# Patient Record
Sex: Male | Born: 1950
Health system: Southern US, Community
[De-identification: ages and names within clinical notes are randomized; demographics above are authoritative.]

## PROBLEM LIST (undated history)

## (undated) DIAGNOSIS — R972 Elevated prostate specific antigen [PSA]: Secondary | ICD-10-CM

## (undated) DIAGNOSIS — T883XXA Malignant hyperthermia due to anesthesia, initial encounter: Secondary | ICD-10-CM

## (undated) DIAGNOSIS — E669 Obesity, unspecified: Secondary | ICD-10-CM

## (undated) DIAGNOSIS — N4231 Prostatic intraepithelial neoplasia: Secondary | ICD-10-CM

## (undated) DIAGNOSIS — I10 Essential (primary) hypertension: Secondary | ICD-10-CM

## (undated) DIAGNOSIS — Z9989 Dependence on other enabling machines and devices: Secondary | ICD-10-CM

## (undated) DIAGNOSIS — N529 Male erectile dysfunction, unspecified: Secondary | ICD-10-CM

## (undated) DIAGNOSIS — E7800 Pure hypercholesterolemia, unspecified: Secondary | ICD-10-CM

## (undated) DIAGNOSIS — M1990 Unspecified osteoarthritis, unspecified site: Secondary | ICD-10-CM

## (undated) DIAGNOSIS — G4733 Obstructive sleep apnea (adult) (pediatric): Secondary | ICD-10-CM

## (undated) HISTORY — PX: JOINT REPLACEMENT: SHX530

## (undated) HISTORY — DX: Male erectile dysfunction, unspecified: N52.9

## (undated) HISTORY — DX: Prostatic intraepithelial neoplasia: N42.31

## (undated) HISTORY — PX: HAMMER TOE SURGERY: SHX385

## (undated) HISTORY — DX: Essential (primary) hypertension: I10

## (undated) HISTORY — DX: Pure hypercholesterolemia, unspecified: E78.00

## (undated) HISTORY — DX: Obesity, unspecified: E66.9

## (undated) HISTORY — PX: SOFT TISSUE BIOPSY: SHX1053

## (undated) HISTORY — DX: Elevated prostate specific antigen (PSA): R97.20

---

## 1984-09-10 DIAGNOSIS — T883XXA Malignant hyperthermia due to anesthesia, initial encounter: Secondary | ICD-10-CM

## 1984-09-10 HISTORY — DX: Malignant hyperthermia due to anesthesia, initial encounter: T88.3XXA

## 2003-09-22 ENCOUNTER — Emergency Department (HOSPITAL_COMMUNITY): Admission: AD | Admit: 2003-09-22 | Discharge: 2003-09-22 | Payer: Self-pay | Admitting: Family Medicine

## 2006-04-23 ENCOUNTER — Ambulatory Visit: Payer: Self-pay | Admitting: Gastroenterology

## 2007-08-13 DIAGNOSIS — I10 Essential (primary) hypertension: Secondary | ICD-10-CM | POA: Insufficient documentation

## 2007-10-08 DIAGNOSIS — E7800 Pure hypercholesterolemia, unspecified: Secondary | ICD-10-CM

## 2007-10-08 DIAGNOSIS — E785 Hyperlipidemia, unspecified: Secondary | ICD-10-CM | POA: Insufficient documentation

## 2007-10-08 HISTORY — DX: Pure hypercholesterolemia, unspecified: E78.00

## 2008-01-06 DIAGNOSIS — R6882 Decreased libido: Secondary | ICD-10-CM | POA: Insufficient documentation

## 2008-07-05 DIAGNOSIS — M2140 Flat foot [pes planus] (acquired), unspecified foot: Secondary | ICD-10-CM | POA: Insufficient documentation

## 2010-02-01 DIAGNOSIS — L83 Acanthosis nigricans: Secondary | ICD-10-CM | POA: Insufficient documentation

## 2012-03-25 ENCOUNTER — Ambulatory Visit: Payer: Self-pay | Admitting: Physician Assistant

## 2015-02-22 ENCOUNTER — Other Ambulatory Visit: Payer: Self-pay

## 2015-02-24 ENCOUNTER — Ambulatory Visit (INDEPENDENT_AMBULATORY_CARE_PROVIDER_SITE_OTHER): Payer: BLUE CROSS/BLUE SHIELD | Admitting: Family Medicine

## 2015-02-24 ENCOUNTER — Encounter: Payer: Self-pay | Admitting: Family Medicine

## 2015-02-24 VITALS — BP 122/84 | HR 71 | Temp 97.9°F | Resp 18 | Ht 72.0 in | Wt 276.0 lb

## 2015-02-24 DIAGNOSIS — E669 Obesity, unspecified: Secondary | ICD-10-CM | POA: Insufficient documentation

## 2015-02-24 DIAGNOSIS — G4733 Obstructive sleep apnea (adult) (pediatric): Secondary | ICD-10-CM | POA: Diagnosis not present

## 2015-02-24 DIAGNOSIS — M9420 Chondromalacia, unspecified site: Secondary | ICD-10-CM | POA: Insufficient documentation

## 2015-02-24 DIAGNOSIS — E7800 Pure hypercholesterolemia, unspecified: Secondary | ICD-10-CM

## 2015-02-24 DIAGNOSIS — E66812 Obesity, class 2: Secondary | ICD-10-CM | POA: Insufficient documentation

## 2015-02-24 DIAGNOSIS — Z1159 Encounter for screening for other viral diseases: Secondary | ICD-10-CM | POA: Insufficient documentation

## 2015-02-24 DIAGNOSIS — J301 Allergic rhinitis due to pollen: Secondary | ICD-10-CM | POA: Diagnosis not present

## 2015-02-24 DIAGNOSIS — I10 Essential (primary) hypertension: Secondary | ICD-10-CM

## 2015-02-24 DIAGNOSIS — E668 Other obesity: Secondary | ICD-10-CM | POA: Diagnosis not present

## 2015-02-24 DIAGNOSIS — N528 Other male erectile dysfunction: Secondary | ICD-10-CM

## 2015-02-24 DIAGNOSIS — Z9989 Dependence on other enabling machines and devices: Secondary | ICD-10-CM

## 2015-02-24 DIAGNOSIS — IMO0002 Reserved for concepts with insufficient information to code with codable children: Secondary | ICD-10-CM

## 2015-02-24 HISTORY — DX: Dependence on other enabling machines and devices: Z99.89

## 2015-02-24 HISTORY — DX: Obesity, unspecified: E66.9

## 2015-02-24 HISTORY — DX: Obstructive sleep apnea (adult) (pediatric): G47.33

## 2015-02-24 MED ORDER — TADALAFIL 20 MG PO TABS: 20.0000 mg | ORAL_TABLET | Freq: Every day | ORAL | Status: DC | PRN

## 2015-02-24 NOTE — Patient Instructions (Signed)
F/u in 4 mo 

## 2015-02-24 NOTE — Progress Notes (Signed)
Name: Warren [name] [place]   MRN: [id number]    DOB: [birth year]   Date:02/24/2015       Progress Note  Subjective  Chief Complaint  Chief Complaint  Patient presents with  . Hypertension    Hypertension This is a chronic problem. The current episode started more than 1 year ago. The problem has been gradually improving since onset. Pertinent negatives include no blurred vision, chest pain, headaches, neck pain, orthopnea, palpitations or shortness of breath. There are no associated agents to hypertension. Risk factors for coronary artery disease include obesity and stress. Past treatments include angiotensin blockers, diuretics and calcium channel blockers. There are no compliance problems.  There is no history of kidney disease, CAD/MI or CVA.  Erectile Dysfunction This is a chronic problem. The current episode started more than 1 year ago. The problem is unchanged. The nature of his difficulty is achieving erection, maintaining erection and penetration. Non-physiologic factors contributing to erectile dysfunction are a decreased libido. He reports no anxiety. Irritative symptoms do not include frequency or urgency. Pertinent negatives include no chills, genital pain or hematuria. The symptoms are aggravated by stress. The treatment provided moderate relief. He has been using treatment for 2 or more years. He has had no adverse reactions caused by medications. Risk factors include hypertension.   Obstructive sleep apnea.  Patient continues uses CPAP on a regular basis. He is achieving about 7 or more hours of sleep per night he feels much more energetic and is achieved a 10 pound weight loss since he's been on this the CPAP regularly.  Obesity  Patient is achieved a 10 pound weight loss of recent. He's working more regular sleep rigorously on his diet and with regular exercise including walking and golfing.   Past Medical History  Diagnosis Date  . Hypertension     History  Substance Use  Topics  . Smoking status: Never Smoker   . Smokeless tobacco: Not on file  . Alcohol Use: 0.0 oz/week    0 Standard drinks or equivalent per week     Comment: occasional wine     Current outpatient prescriptions:  .  amLODipine (NORVASC) 10 MG tablet, Take 10 mg by mouth daily., Disp: , Rfl:  .  aspirin (ASPIRIN ADULT LOW STRENGTH) 81 MG EC tablet, Take by mouth., Disp: , Rfl:  .  losartan-hydrochlorothiazide (HYZAAR) 100-12.5 MG per tablet, Take 1 tablet by mouth daily., Disp: , Rfl:  .  OMEGA-3 FATTY ACIDS PO, , Disp: , Rfl:  .  tadalafil (CIALIS) 20 MG tablet, Take by mouth., Disp: , Rfl:   No Known Allergies  Review of Systems  Constitutional: Negative for fever, chills and weight loss.  HENT: Negative for congestion, hearing loss, sore throat and tinnitus.   Eyes: Negative for blurred vision, double vision and redness.  Respiratory: Negative for cough, hemoptysis and shortness of breath.   Cardiovascular: Negative for chest pain, palpitations, orthopnea, claudication and leg swelling.  Gastrointestinal: Negative for heartburn, nausea, vomiting, diarrhea, constipation and blood in stool.  Genitourinary: Positive for decreased libido. Negative for urgency, frequency and hematuria.  Musculoskeletal: Negative for myalgias, back pain, joint pain, falls and neck pain.  Skin: Negative for itching.  Neurological: Negative for dizziness, tingling, tremors, focal weakness, seizures, loss of consciousness, weakness and headaches.  Endo/Heme/Allergies: Does not bruise/bleed easily.  Psychiatric/Behavioral: Negative for depression and substance abuse. The patient is not nervous/anxious and does not have insomnia.      Objective  Filed Vitals:  02/24/15 0746  BP: 122/84  Pulse: 71  Temp: 97.9 F (36.6 C)  TempSrc: Oral  Resp: 18  Height: 6' (1.829 m)  Weight: 276 lb (125.193 kg)  SpO2: 98%     Physical Exam  Constitutional: He is oriented to person, place, and time and  well-developed, well-nourished, and in no distress.  HENT:  Head: Normocephalic.  Eyes: EOM are normal. Pupils are equal, round, and reactive to light.  Neck: Normal range of motion. Neck supple. No thyromegaly present.  Cardiovascular: Normal rate, regular rhythm and normal heart sounds.   No murmur heard. Pulmonary/Chest: Effort normal and breath sounds normal. No respiratory distress. He has no wheezes.  Abdominal: Soft. Bowel sounds are normal.  Musculoskeletal: Normal range of motion. He exhibits no edema.  Lymphadenopathy:    He has no cervical adenopathy.  Neurological: He is alert and oriented to person, place, and time. No cranial nerve deficit. Gait normal. Coordination normal.  Skin: Skin is warm and dry. No rash noted.  Psychiatric: Affect and judgment normal.      Assessment & Plan  1. Benign essential HTN Well-controlled  2. Obstructive sleep apnea of adult Well-controlled on CPAP  3. Allergic rhinitis due to pollen Under good control  4. Adult BMI 30+ He is achieved a nice weight loss of 10 pounds  5. Hypercholesterolemia without hypertriglyceridemia Or stable last visit  6. Other male erectile dysfunction We'll renew Cialis

## 2015-03-15 ENCOUNTER — Telehealth: Payer: Self-pay | Admitting: Family Medicine

## 2015-03-15 DIAGNOSIS — N528 Other male erectile dysfunction: Secondary | ICD-10-CM

## 2015-03-15 NOTE — Telephone Encounter (Signed)
Requesting refill on [facility]is [facility] Please call once this is completed. Thank you [phone]

## 2015-03-22 MED ORDER — TADALAFIL 20 MG PO TABS: 20.0000 mg | ORAL_TABLET | Freq: Every day | ORAL | Status: DC | PRN

## 2015-03-22 NOTE — Telephone Encounter (Signed)
Refill has been sent.  °

## 2015-03-30 ENCOUNTER — Other Ambulatory Visit: Payer: Self-pay | Admitting: Emergency Medicine

## 2015-03-30 DIAGNOSIS — N528 Other male erectile dysfunction: Secondary | ICD-10-CM

## 2015-03-30 MED ORDER — TADALAFIL 20 MG PO TABS: 20.0000 mg | ORAL_TABLET | Freq: Every day | ORAL | Status: DC | PRN

## 2015-05-20 ENCOUNTER — Other Ambulatory Visit: Payer: Self-pay | Admitting: Family Medicine

## 2015-06-27 ENCOUNTER — Ambulatory Visit (INDEPENDENT_AMBULATORY_CARE_PROVIDER_SITE_OTHER): Payer: BLUE CROSS/BLUE SHIELD | Admitting: Family Medicine

## 2015-06-27 ENCOUNTER — Encounter: Payer: Self-pay | Admitting: Family Medicine

## 2015-06-27 ENCOUNTER — Encounter (INDEPENDENT_AMBULATORY_CARE_PROVIDER_SITE_OTHER): Payer: Self-pay

## 2015-06-27 VITALS — BP 128/82 | HR 69 | Temp 98.0°F | Resp 18 | Ht 72.0 in | Wt 285.6 lb

## 2015-06-27 DIAGNOSIS — E7800 Pure hypercholesterolemia, unspecified: Secondary | ICD-10-CM

## 2015-06-27 DIAGNOSIS — G4733 Obstructive sleep apnea (adult) (pediatric): Secondary | ICD-10-CM

## 2015-06-27 DIAGNOSIS — I10 Essential (primary) hypertension: Secondary | ICD-10-CM

## 2015-06-27 NOTE — Progress Notes (Signed)
Name: Warren [name] [place]   MRN: [id number]    DOB: 07-Feb-1951   Date:06/27/2015       Progress Note  Subjective  Chief Complaint  Chief Complaint  Patient presents with  . Hypertension    4 month follow up    HPI    Hypertension   Patient presents for follow-up of hypertension. It has been present for over over 5 years.  Patient states that there is compliance with medical regimen which consists of Norvasc 10 mg daily losartan HCT 100-12 0.5 daily . There is no end organ disease. Cardiac risk factors include hypertension hyperlipidemia and diabetes.  Exercise regimen consist of walking and golfing some running .  Diet consist of some inappropriate foods at times    . Hyperlipidemia  Patient has a history of hyperlipidemia for over 5 omega-3 fatty acids.  uses for years.  Current medical regimen consist of omega-3 fatty acids .  Compliance is sometimes good .  Diet and exercise are currently followed usually .  Risk factors for cardiovascular disease include hyperlipidemia and hypertension obesity .   There have been no side effects from the medication.     Past Medical History  Diagnosis Date  . Hypertension     Social History  Substance Use Topics  . Smoking status: Never Smoker   . Smokeless tobacco: Not on file  . Alcohol Use: 0.0 oz/week    0 Standard drinks or equivalent per week     Comment: occasional wine     Current outpatient prescriptions:  .  amLODipine (NORVASC) 10 MG tablet, TAKE 1 TABLET DAILY, Disp: 90 tablet, Rfl: 0 .  aspirin (ASPIRIN ADULT LOW STRENGTH) 81 MG EC tablet, Take by mouth., Disp: , Rfl:  .  losartan-hydrochlorothiazide (HYZAAR) 100-12.5 MG per tablet, Take 1 tablet by mouth daily., Disp: , Rfl:  .  OMEGA-3 FATTY ACIDS PO, , Disp: , Rfl:  .  tadalafil (CIALIS) 20 MG tablet, Take 1 tablet (20 mg total) by mouth daily as needed for erectile dysfunction., Disp: 18 tablet, Rfl: 4  No Known Allergies  Review of Systems  Constitutional: Negative  for fever, chills and weight loss.  HENT: Negative for congestion, hearing loss, sore throat and tinnitus.   Eyes: Negative for blurred vision, double vision and redness.  Respiratory: Negative for cough, hemoptysis and shortness of breath.   Cardiovascular: Negative for chest pain, palpitations, orthopnea, claudication and leg swelling.  Gastrointestinal: Negative for heartburn, nausea, vomiting, diarrhea, constipation and blood in stool.  Genitourinary: Negative for dysuria, urgency, frequency and hematuria.  Musculoskeletal: Positive for joint pain. Negative for myalgias, back pain, falls and neck pain.  Skin: Negative for itching.  Neurological: Negative for dizziness, tingling, tremors, focal weakness, seizures, loss of consciousness, weakness and headaches.  Endo/Heme/Allergies: Does not bruise/bleed easily.  Psychiatric/Behavioral: Negative for depression and substance abuse. The patient is not nervous/anxious and does not have insomnia.      Objective  Filed Vitals:   06/27/15 0745  BP: 128/82  Pulse: 69  Temp: 98 F (36.7 C)  TempSrc: Oral  Resp: 18  Height: 6' (1.829 m)  Weight: 285 lb 9.6 oz (129.547 kg)  SpO2: 97%     Physical Exam  Constitutional: He is oriented to person, place, and time and well-developed, well-nourished, and in no distress.  Obesity no acute distress  HENT:  Head: Normocephalic.  Eyes: EOM are normal. Pupils are equal, round, and reactive to light.  Neck: Normal range of motion. Neck  supple. No thyromegaly present.  Cardiovascular: Normal rate, regular rhythm and normal heart sounds.   No murmur heard. Pulmonary/Chest: Effort normal and breath sounds normal. No respiratory distress. He has no wheezes.  Abdominal: Soft. Bowel sounds are normal.  Musculoskeletal: Normal range of motion. He exhibits no edema.  Lymphadenopathy:    He has no cervical adenopathy.  Neurological: He is alert and oriented to person, place, and time. No cranial  nerve deficit. Gait normal. Coordination normal.  Skin: Skin is warm and dry. No rash noted.  Psychiatric: Affect and judgment normal.     Assessment & Plan  1. Benign essential HTN Well-controlled  2. Hypercholesterolemia without hypertriglyceridemia Patient has had lab done at work he will send also reported  3. Obstructive sleep apnea of adult Stable

## 2015-07-29 ENCOUNTER — Other Ambulatory Visit: Payer: Self-pay | Admitting: Family Medicine

## 2015-08-16 ENCOUNTER — Other Ambulatory Visit: Payer: Self-pay | Admitting: Family Medicine

## 2015-10-25 ENCOUNTER — Other Ambulatory Visit: Payer: Self-pay | Admitting: Family Medicine

## 2015-11-15 ENCOUNTER — Telehealth: Payer: Self-pay | Admitting: *Deleted

## 2015-11-15 ENCOUNTER — Encounter: Payer: Self-pay | Admitting: Podiatry

## 2015-11-15 ENCOUNTER — Ambulatory Visit (INDEPENDENT_AMBULATORY_CARE_PROVIDER_SITE_OTHER): Payer: BLUE CROSS/BLUE SHIELD

## 2015-11-15 ENCOUNTER — Ambulatory Visit (INDEPENDENT_AMBULATORY_CARE_PROVIDER_SITE_OTHER): Payer: BLUE CROSS/BLUE SHIELD | Admitting: Podiatry

## 2015-11-15 DIAGNOSIS — M2040 Other hammer toe(s) (acquired), unspecified foot: Secondary | ICD-10-CM

## 2015-11-15 DIAGNOSIS — R52 Pain, unspecified: Secondary | ICD-10-CM

## 2015-11-15 NOTE — Progress Notes (Signed)
   Subjective:    Patient ID: Warren [name] [place], male    DOB: June 13, 1951, 65 y.o.   MRN: [id number]  HPI  Patient presents to the office today with concerns of pain to the ends of his toes more on the right than the left. This started about 6 months ago. No injury or trauma. No tingling or numbness to the toes. No swelling or redness. He states when he walks he walks on the tips of the toes. He has tried shoe gear modifications and padding without any relief of symptoms. No other complaints at this time.    Review of Systems  All other systems reviewed and are negative.      Objective:   Physical Exam General: AAO x3, NAD  Dermatological: Hyperkeratotic lesions are present on the dorsal PIPJ of the fourth and fifth toes on the right side of the distal third toe. No ulceration, drainage or other signs of infection. No other lesions. Scars from prior surgery well-healed.  Vascular: Dorsalis Pedis artery and Posterior Tibial artery pedal pulses are 2/4 bilateral with immedate capillary fill time. Pedal hair growth present. No varicosities and no lower extremity edema present bilateral. There is no pain with calf compression, swelling, warmth, erythema.   Neruologic: Grossly intact via light touch bilateral. Vibratory intact via tuning fork bilateral. Protective threshold with Semmes Wienstein monofilament intact to all pedal sites bilateral. Patellar and Achilles deep tendon reflexes 2+ bilateral. No Babinski or clonus noted bilateral.   Musculoskeletal: There are semirigid hammertoe contractures present bilateral the right side worse than left. There is tenderness on hammertoe contractures on the right side. There is a mild decrease in medial arch height. There is no pain with first MPJ range of motion. No other areas of tenderness bilateral lower extremities.   Gait: Unassisted, Nonantalgic.      Assessment & Plan:  65 year old male symptomatic hammertoes right worse than left -Treatment  options discussed including all alternatives, risks, and complications -X-rays were obtained and reviewed with the patient. There is evidence of hammertoe contracture. No evidence of acute fracture stress fracture. -Etiology of symptoms were discussed -I discussed both conservative and surgical treatment options. At this time has attempted conservative treatment without any relief at the procedure the surgical intervention. I discussed with him hammertoe repair to digits 2 through 5 on the right side. He wishes to proceed. -The incision placement as well as the postoperative course was discussed with the patient. I discussed risks of the surgery which include, but not limited to, infection, bleeding, pain, swelling, need for further surgery, delayed or nonhealing, painful or ugly scar, numbness or sensation changes, over/under correction, recurrence, transfer lesions, further deformity, hardware failure, DVT/PE, loss of toe/foot. Patient understands these risks and wishes to proceed with surgery. The surgical consent was reviewed with the patient all 3 pages were signed. No promises or guarantees were given to the outcome of the procedure. All questions were answered to the best of my ability. Before the surgery the patient was encouraged to call the office if there is any further questions. [facility] basis.  Ovid CurdMatthew Wagoner, DPM

## 2015-11-15 NOTE — Patient Instructions (Addendum)
Pre-Operative Instructions  Congratulations, you have decided to take an important step to improving your quality of life.  You can be assured that the doctors of [facility] will be with you every step of the way.   Plan to be at the surgery center/hospital at least 1 (one) hour prior to your scheduled time unless otherwise directed by the surgical center/hospital staff.  You must have a responsible adult accompany you, remain during the surgery and drive you home.  Make sure you have directions to the surgical center/hospital and know how to get there on time.  For hospital based surgery you will need to obtain a history and physical form from your family physician within 1 month prior to the date of surgery- we will give you a form for you primary physician.   We make every effort to accommodate the date you request for surgery.  There are however, times where surgery dates or times have to be moved.  We will contact you as soon as possible if a change in schedule is required.    No Aspirin/Ibuprofen for one week before surgery.  If you are on aspirin, any non-steroidal anti-inflammatory medications (Mobic, Aleve, Ibuprofen) you should stop taking it 7 days prior to your surgery.  You make take Tylenol  For pain prior to surgery.   Medications- If you are taking daily heart and blood pressure medications, seizure, reflux, allergy, asthma, anxiety, pain or diabetes medications, make sure the surgery center/hospital is aware before the day of surgery so they may notify you which medications to take or avoid the day of surgery.  No food or drink after midnight the night before surgery unless directed otherwise by surgical center/hospital staff.  No alcoholic beverages 24 hours prior to surgery.  No smoking 24 hours prior to or 24 hours after surgery.  Wear loose pants or shorts- loose enough to fit over bandages, boots, and casts.  No slip on shoes, sneakers are best.  Bring your boot  with you to the surgery center/hospital.  Also bring crutches or a walker if your physician has prescribed it for you.  If you do not have this equipment, it will be provided for you after surgery.  If you have not been contracted by the surgery center/hospital by the day before your surgery, call to confirm the date and time of your surgery.  Leave-time from work may vary depending on the type of surgery you have.  Appropriate arrangements should be made prior to surgery with your employer.  Prescriptions will be provided immediately following surgery by your doctor.  Have these filled as soon as possible after surgery and take the medication as directed.  Remove nail polish on the operative foot.  Wash the night before surgery.  The night before surgery wash the foot and leg well with the antibacterial soap provided and water paying special attention to beneath the toenails and in between the toes.  Rinse thoroughly with water and dry well with a towel.  Perform this wash unless told not to do so by your physician.  Enclosed: 1 Ice pack (please put in freezer the night before surgery)   1 Hibiclens skin cleaner   Pre-op Instructions  If you have any questions regarding the instructions, do not hesitate to call our office at any point during this process.   [place]: [address] [place], [place] [address] [phone]  [place]: [address], [place], [place] [address] [phone]  Dr. Ovid Curd, DPM  Hammer Toes Hammer toes is a condition in which one or more of your toes is permanently flexed. CAUSES  This happens when a muscle imbalance or abnormal bone length makes your small toes buckle. This causes the toe joint to contract and the strong cord-like bands that attach muscles to the bones (tendons) in your toes to shorten.  SIGNS AND SYMPTOMS  Common symptoms of flexible hammer toes include:   A buildup of skin cells (corns). Corns occur where boney bumps come in  frequent contact with hard surfaces. For example, where your shoes press and rub.  Irritation.  Inflammation.  Pain.  Limited motion in your toes. DIAGNOSIS  Hammer toes are diagnosed through a physical exam of your toes. During the exam, your health care provider may try to reproduce your symptoms by manipulating your foot. Often, X-ray exams are done to determine the degree of deformity and to make sure that the cause is not a fracture.  TREATMENT  Hammer toes can be treated with corrective surgery. There are several types of surgical procedures that can treat hammer toes. The most common procedures include:  Arthroplasty--A portion of the joint is surgically removed and your toe is straightened. The gap fills in with fibrous tissue. This procedure helps treat pain and deformity and helps restore function.  Fusion--Cartilage between the two bones of the affected joint is taken out and the bones fuse together into one longer bone. This helps keep your toe stable and reduces pain but leaves your toe stiff, yet straight.  Implantation--A portion of your bone is removed and replaced with an implant to restore motion.  Flexor tendon transfers--This procedure repositions the tendons that curl the toes down (flexor tendons). This may be done to release the deforming force that causes your toe to buckle. Several of these procedures require fixing your toe with a pin that is visible at the tip of your toe. The pin keeps the toe straight during healing. Your health care provider will remove the pin usually within 4-8 weeks after the procedure.    This information is not intended to replace advice given to you by your health care provider. Make sure you discuss any questions you have with your health care provider.   Document Released: 08/24/2000 Document Revised: 09/01/2013 Document Reviewed: 05/04/2013 Elsevier Interactive Patient Education [employer]

## 2015-11-15 NOTE — Telephone Encounter (Signed)
"  I was told to call you to schedule my surgery.  I tried to go on-line and register but I couldn't because I needed a date."  I can give you a tentative date.  I don't have your information yet.  When would you like to schedule?  "I'd like to do it as soon as possible."  He can do it next week, March 15.  "That will be fine.  I want to go ahead and get it over with."  Surgical center will call you a day or two prior to surgery date and give you the arrival time.

## 2015-11-23 ENCOUNTER — Encounter: Payer: Self-pay | Admitting: Podiatry

## 2015-11-23 DIAGNOSIS — M2041 Other hammer toe(s) (acquired), right foot: Secondary | ICD-10-CM | POA: Diagnosis not present

## 2015-11-24 ENCOUNTER — Encounter: Payer: Self-pay | Admitting: Podiatry

## 2015-11-24 NOTE — Progress Notes (Signed)
Patient ID: Tarrin L [place], male   DOB: Sep 27, 1950, 65 y.o.   MRN: [id number]   Patient called the call service at 8:30 on 11/23/15 asking about how to ice his foot. Discussed how with him. He states he is doing well otherwise and pain is controlled. Call if there are any other questions.

## 2015-11-29 ENCOUNTER — Ambulatory Visit (INDEPENDENT_AMBULATORY_CARE_PROVIDER_SITE_OTHER): Payer: BLUE CROSS/BLUE SHIELD | Admitting: Podiatry

## 2015-11-29 ENCOUNTER — Ambulatory Visit: Payer: BLUE CROSS/BLUE SHIELD

## 2015-11-29 ENCOUNTER — Encounter: Payer: Self-pay | Admitting: Podiatry

## 2015-11-29 DIAGNOSIS — M2040 Other hammer toe(s) (acquired), unspecified foot: Secondary | ICD-10-CM

## 2015-11-29 DIAGNOSIS — M2041 Other hammer toe(s) (acquired), right foot: Secondary | ICD-10-CM | POA: Diagnosis not present

## 2015-11-29 DIAGNOSIS — Z9889 Other specified postprocedural states: Secondary | ICD-10-CM

## 2015-11-29 NOTE — Progress Notes (Signed)
Patient ID: Jermal L [place], male   DOB: 1951/02/05, 65 y.o.   MRN: [id number]  Subjective: Evie L [place] is a 65 y.o. is seen today in office s/p right hammertoe 2-5 preformed on 11/23/15. He states he is a pain medicine for the first 3 days please discontinue doing this. He has not taken any anti-inflammatories either. He is continuing the CAM boot at the request and smaller shoe. Denies any systemic complaints such as fevers, chills, nausea, vomiting. No calf pain, chest pain, shortness of breath.   Objective: General: No acute distress, AAOx3  DP/PT pulses palpable 2/4, CRT < 3 sec to all digits.  Protective sensation intact. Motor function intact.  Right foot: Incision is well coapted without any evidence of dehiscence and sutures intact. There is no surrounding erythema, ascending cellulitis, fluctuance, crepitus, malodor, drainage/purulence. There is minimal edema around the surgical site. There is minimal pain along the surgical site. K wires intact the second, third, fourth toe without any drainage or redness. Toes rectus position. No other areas of tenderness to bilateral lower extremities.  No other open lesions or pre-ulcerative lesions.  No pain with calf compression, swelling, warmth, erythema.   Assessment and Plan:  Status post right hammertoe repair 2 through 5, doing well with no complications   -Treatment options discussed including all alternatives, risks, and complications -X-rays were obtained and reviewed with the patient. K wires intact. All of the K wires to appear to be not central within the toe the toes clinically appear to be rectus in good position. -Antibiotic ointment was applied along the area followed by dry sterile dressing. Keep the dressing clean, dry, intact. -Will switch to a short cam boot which was dispensed today. -Ice/elevation -Pain medication as needed. -Monitor for any clinical signs or symptoms of infection and DVT/PE and directed to call the office  immediately should any occur or go to the ER. -Follow-up in 1 week for likely suture removal or sooner if any problems arise. In the meantime, encouraged to call the office with any questions, concerns, change in symptoms.   Ovid CurdMatthew Wagoner, DPM

## 2015-12-01 ENCOUNTER — Encounter: Payer: Self-pay | Admitting: Podiatry

## 2015-12-08 ENCOUNTER — Ambulatory Visit (INDEPENDENT_AMBULATORY_CARE_PROVIDER_SITE_OTHER): Payer: BLUE CROSS/BLUE SHIELD | Admitting: Podiatry

## 2015-12-08 ENCOUNTER — Encounter: Payer: Self-pay | Admitting: Podiatry

## 2015-12-08 VITALS — BP 138/85 | HR 79 | Resp 18

## 2015-12-08 DIAGNOSIS — M2040 Other hammer toe(s) (acquired), unspecified foot: Secondary | ICD-10-CM

## 2015-12-08 DIAGNOSIS — M79673 Pain in unspecified foot: Secondary | ICD-10-CM

## 2015-12-08 DIAGNOSIS — Z9889 Other specified postprocedural states: Secondary | ICD-10-CM

## 2015-12-08 NOTE — Progress Notes (Signed)
Patient ID: Colton L [place], male   DOB: May 01, 1951, 65 y.o.   MRN: [id number]  Subjective: Belinda L [place] is a 65 y.o. is seen today in office s/p right hammertoe 2-5 preformed on 11/23/15. He states he is doing well is not taking any pain medication. He does walk quite a bit in the boot at work. He does ice and elevate when he gets them. Continue the cam boot. He'll at the ON schedule the left foot. Denies any systemic complaints such as fevers, chills, nausea, vomiting. No calf pain, chest pain, shortness of breath.   Objective: General: No acute distress, AAOx3  DP/PT pulses palpable 2/4, CRT < 3 sec to all digits.  Protective sensation intact. Motor function intact.  Right foot: Incision is well coapted without any evidence of dehiscence and sutures intact. There is no surrounding erythema, ascending cellulitis, fluctuance, crepitus, malodor, drainage/purulence. There is minimal edema around the surgical site. There is currently no pain along the surgical site. K wires intact the second, third, fourth toe without any drainage or redness. Toes rectus position. Hammertoe contractures present left side just semirigid. There is tenderness on dorsal PIPJ of the toes 2 through 5. No other areas of tenderness to bilateral lower extremities.  No other open lesions or pre-ulcerative lesions.  No pain with calf compression, swelling, warmth, erythema.   Assessment and Plan:  Status post right hammertoe repair 2 through 5, doing well with no complications   -Treatment options discussed including all alternatives, risks, and complications -Sutures removed today without complications. Antibiotic appointment was applied followed by dressing. Keep dressing clean, dry, intact. -Continue cam boot. Weightbearing as tolerated. -Pain medication as needed. -Monitor for any clinical signs or symptoms of infection and DVT/PE and directed to call the office immediately should any occur or go to the ER. -Follow-up in  2 weeks or sooner if any problems arise. In the meantime, encouraged to call the office with any questions, concerns, change in symptoms.   *scheudled left foot surgery for May 10th, will do consent next appointment   Ovid CurdMatthew Wagoner, DPM

## 2015-12-22 ENCOUNTER — Encounter: Payer: Self-pay | Admitting: Podiatry

## 2015-12-22 ENCOUNTER — Ambulatory Visit (INDEPENDENT_AMBULATORY_CARE_PROVIDER_SITE_OTHER): Payer: BLUE CROSS/BLUE SHIELD

## 2015-12-22 ENCOUNTER — Ambulatory Visit (INDEPENDENT_AMBULATORY_CARE_PROVIDER_SITE_OTHER): Payer: BLUE CROSS/BLUE SHIELD | Admitting: Podiatry

## 2015-12-22 VITALS — BP 146/78 | HR 64 | Resp 18

## 2015-12-22 DIAGNOSIS — M2042 Other hammer toe(s) (acquired), left foot: Secondary | ICD-10-CM | POA: Diagnosis not present

## 2015-12-22 DIAGNOSIS — Z9889 Other specified postprocedural states: Secondary | ICD-10-CM | POA: Diagnosis not present

## 2015-12-22 DIAGNOSIS — M2041 Other hammer toe(s) (acquired), right foot: Secondary | ICD-10-CM

## 2015-12-25 ENCOUNTER — Encounter: Payer: Self-pay | Admitting: Podiatry

## 2015-12-25 DIAGNOSIS — M2042 Other hammer toe(s) (acquired), left foot: Secondary | ICD-10-CM | POA: Insufficient documentation

## 2015-12-25 NOTE — Progress Notes (Signed)
Patient ID: Warren [name] [name], male   DOB: 05-12-1951, 65 y.o.   MRN: [id number]  Subjective: Warren [name] is a 65 y.o. is seen today in office s/p right hammertoe 2-5 preformed on 11/23/15. He states he is doing well and is having no pain to the toes. He takes ibuprofen intermittently. He has continue the cam boot. He also today he would like to schedule surgery for his left foot. He states his toes continue be painful as particular shoe and pressure. He is tried multiple conservative treatments including shoe gear modifications and offloading without any resolution.  Denies any systemic complaints such as fevers, chills, nausea, vomiting. No calf pain, chest pain, shortness of breath.   Objective: General: No acute distress, AAOx3  DP/PT pulses palpable 2/4, CRT < 3 sec to all digits.  Protective sensation intact. Motor function intact.  Right foot: Incision is well coapted without any evidence of dehiscence and scars have formed. There is no surrounding erythema, ascending cellulitis, fluctuance, crepitus, malodor, drainage/purulence. There is minimal edema around the surgical site. K wires are intact with a drainage or pus. There is currently no pain along the surgical site. Toes rectus position, however the third toe is slightly deviated. Hammertoe contractures present left side just semirigid. There is tenderness on dorsal PIPJ of the toes 2 through 5. No other areas of tenderness to bilateral lower extremities.  No other open lesions or pre-ulcerative lesions.  No pain with calf compression, swelling, warmth, erythema.   Assessment and Plan:  Status post right hammertoe repair 2 through 5, doing well with no complications   -Treatment options discussed including all alternatives, risks, and complications -X-rays were obtained and reviewed with the patient. No evidence of acute fracture or stress fracture. K wires intact. -Antibiotic ointment was applied to the surgical site and the right foot.  Keep dressing clean, dry, intact. We'll switch a surgical shoe today which was dispensed. -Regards left fibula to proceed with surgical intervention. Discussed with hammertoe repair to digits 2 through 5 with K wire fixation. He wishes to proceed. The incision placement as well as the postoperative course was discussed with the patient. I discussed risks of the surgery which include, but not limited to, infection, bleeding, pain, swelling, need for further surgery, delayed or nonhealing, painful or ugly scar, numbness or sensation changes, over/under correction, recurrence, transfer lesions, further deformity, hardware failure, DVT/PE, loss of toe/foot. Patient understands these risks and wishes to proceed with surgery. The surgical consent was reviewed with the patient all 3 pages were signed. No promises or guarantees were given to the outcome of the procedure. All questions were answered to the best of my ability. Before the surgery the patient was encouraged to call the office if there is any further questions. The [facility] an outpatient basis.  -Follow-up in 2 weeks or sooner if any problems arise. In the meantime, encouraged to call the office with any questions, concerns, change in symptoms.   Ovid CurdMatthew Wagoner, DPM

## 2015-12-26 ENCOUNTER — Ambulatory Visit: Payer: BLUE CROSS/BLUE SHIELD | Admitting: Family Medicine

## 2015-12-26 NOTE — Progress Notes (Signed)
Surgery performed at [facility], Hammer Toe Repair 2,3,4,5 right foot.  Prescription was written for Percocet 5/325, quantity 30, Phenergan 25 mg quantity 30 and Keflex 500 mg quantity 21.

## 2016-01-04 ENCOUNTER — Telehealth: Payer: Self-pay | Admitting: *Deleted

## 2016-01-04 NOTE — Telephone Encounter (Signed)
"  I'm scheduled to have another surgery on May 10th.  I was told to register when they called me from the surgical center.  I haven't received a call yet."  You can go ahead and register if you have access to a computer.  "I do.  Let me ask you this.  The last time I had surgery and tried to register it asked for a time of surgery.  It wouldn't let me skip the question and let me go further unless I listed a time.  I went ahead and put 7 am and proceeded.  What time will my surgery be?"  I don't know the time.  Just do the same thing, it doesn't matter.  They normally call you a day or two prior to surgery date with the arrival time.  "Okay, thank you."

## 2016-01-05 ENCOUNTER — Ambulatory Visit (INDEPENDENT_AMBULATORY_CARE_PROVIDER_SITE_OTHER): Payer: BLUE CROSS/BLUE SHIELD

## 2016-01-05 ENCOUNTER — Encounter: Payer: Self-pay | Admitting: Podiatry

## 2016-01-05 ENCOUNTER — Ambulatory Visit (INDEPENDENT_AMBULATORY_CARE_PROVIDER_SITE_OTHER): Payer: BLUE CROSS/BLUE SHIELD | Admitting: Podiatry

## 2016-01-05 VITALS — BP 152/91 | HR 66 | Resp 18

## 2016-01-05 DIAGNOSIS — M2040 Other hammer toe(s) (acquired), unspecified foot: Secondary | ICD-10-CM

## 2016-01-05 DIAGNOSIS — Z9889 Other specified postprocedural states: Secondary | ICD-10-CM

## 2016-01-05 DIAGNOSIS — M2041 Other hammer toe(s) (acquired), right foot: Secondary | ICD-10-CM

## 2016-01-05 NOTE — Progress Notes (Signed)
Patient ID: Warren [name], male   DOB: Jun 27, 1951, 65 y.o.   MRN: [id number]  Subjective: Warren [name] is a 65 y.o. is seen today in office s/p right 2-5 hammertoe repair. He presents today for pin removal. He is having no pain and doing well. Has been wearing surgical shoe. Denies any systemic complaints such as fevers, chills, nausea, vomiting. No calf pain, chest pain, shortness of breath.   Objective: General: No acute distress, AAOx3  DP/PT pulses palpable 2/4, CRT < 3 sec to all digits.  Protective sensation intact. Motor function intact.  right foot: Incision is well coapted without any evidence of dehiscence and a scar has formed. K-wires intact There is no surrounding erythema, ascending cellulitis, fluctuance, crepitus, malodor, drainage/purulence. There is minimal edema around the surgical site. There is no pain along the surgical site. Toes sit in a rectus position.  No other areas of tenderness to bilateral lower extremities.  No other open lesions or pre-ulcerative lesions.  No pain with calf compression, swelling, warmth, erythema.   Assessment and Plan:  Status post right hammertoe surgery, doing well with no complications   -Treatment options discussed including all alternatives, risks, and complications -X-rays were obtained and reviewed with the patient. No evidence of acute fracture. Hardware in place.  -Pins removed in total without complications. Antibiotic ointment applied followed by a dressing. Can start to shower tomorrow. Over the weekend he can start to transition to a regular shoe.  -Ice/elevation -Pain medication as needed. -Monitor for any clinical signs or symptoms of infection and DVT/PE and directed to call the office immediately should any occur or go to the ER. -Follow-up May 9th or sooner if any problems arise. In the meantime, encouraged to call the office with any questions, concerns, change in symptoms.   Ovid CurdMatthew Wagoner, DPM

## 2016-01-17 ENCOUNTER — Ambulatory Visit (INDEPENDENT_AMBULATORY_CARE_PROVIDER_SITE_OTHER): Payer: BLUE CROSS/BLUE SHIELD

## 2016-01-17 ENCOUNTER — Encounter: Payer: Self-pay | Admitting: Podiatry

## 2016-01-17 ENCOUNTER — Ambulatory Visit (INDEPENDENT_AMBULATORY_CARE_PROVIDER_SITE_OTHER): Payer: BLUE CROSS/BLUE SHIELD | Admitting: Podiatry

## 2016-01-17 DIAGNOSIS — M2042 Other hammer toe(s) (acquired), left foot: Secondary | ICD-10-CM

## 2016-01-17 DIAGNOSIS — Z9889 Other specified postprocedural states: Secondary | ICD-10-CM

## 2016-01-17 MED ORDER — CEPHALEXIN 500 MG PO CAPS: 500.0000 mg | ORAL_CAPSULE | Freq: Three times a day (TID) | ORAL | Status: DC

## 2016-01-17 MED ORDER — OXYCODONE-ACETAMINOPHEN 5-325 MG PO TABS: 1.0000 | ORAL_TABLET | Freq: Three times a day (TID) | ORAL | Status: DC | PRN

## 2016-01-17 NOTE — Patient Instructions (Signed)
Pre-Operative Instructions  Congratulations, you have decided to take an important step to improving your quality of life.  You can be assured that the doctors of [facility] will be with you every step of the way.  1. Plan to be at the surgery center/hospital at least 1 (one) hour prior to your scheduled time unless otherwise directed by the surgical center/hospital staff.  You must have a responsible adult accompany you, remain during the surgery and drive you home.  Make sure you have directions to the surgical center/hospital and know how to get there on time. 2. For hospital based surgery you will need to obtain a history and physical form from your family physician within 1 month prior to the date of surgery- we will give you a form for you primary physician.  3. We make every effort to accommodate the date you request for surgery.  There are however, times where surgery dates or times have to be moved.  We will contact you as soon as possible if a change in schedule is required.   4. No Aspirin/Ibuprofen for one week before surgery.  If you are on aspirin, any non-steroidal anti-inflammatory medications (Mobic, Aleve, Ibuprofen) you should stop taking it 7 days prior to your surgery.  You make take Tylenol  For pain prior to surgery.  5. Medications- If you are taking daily heart and blood pressure medications, seizure, reflux, allergy, asthma, anxiety, pain or diabetes medications, make sure the surgery center/hospital is aware before the day of surgery so they may notify you which medications to take or avoid the day of surgery. 6. No food or drink after midnight the night before surgery unless directed otherwise by surgical center/hospital staff. 7. No alcoholic beverages 24 hours prior to surgery.  No smoking 24 hours prior to or 24 hours after surgery. 8. Wear loose pants or shorts- loose enough to fit over bandages, boots, and casts. 9. No slip on shoes, sneakers are best. 10. Bring  your boot with you to the surgery center/hospital.  Also bring crutches or a walker if your physician has prescribed it for you.  If you do not have this equipment, it will be provided for you after surgery. 11. If you have not been contracted by the surgery center/hospital by the day before your surgery, call to confirm the date and time of your surgery. 12. Leave-time from work may vary depending on the type of surgery you have.  Appropriate arrangements should be made prior to surgery with your employer. 13. Prescriptions will be provided immediately following surgery by your doctor.  Have these filled as soon as possible after surgery and take the medication as directed. 14. Remove nail polish on the operative foot. 15. Wash the night before surgery.  The night before surgery wash the foot and leg well with the antibacterial soap provided and water paying special attention to beneath the toenails and in between the toes.  Rinse thoroughly with water and dry well with a towel.  Perform this wash unless told not to do so by your physician.  Enclosed: 1 Ice pack (please put in freezer the night before surgery)   1 Hibiclens skin cleaner   Pre-op Instructions  If you have any questions regarding the instructions, do not hesitate to call our office.  [place]: [address] [place], [place] [address] [phone]  [place]: [address] [place], [place] [address] [phone]  [place]: [address]  [place], [place] [address] [phone]   Dr.   Norman Regal DPM, Dr. Matthew Wagoner DPM, Dr. M. Todd Hyatt DPM, Dr. Titorya Stover DPM 

## 2016-01-17 NOTE — Progress Notes (Signed)
Patient ID: Warren [name] [place], male   DOB: Sep 28, 1950, 65 y.o.   MRN: [id number]  Subjective: Warren [name] [place] is a 65 y.o. is seen today in office s/p right 2-5 hammertoe repair. He states that he is doing well and has Dorann OuSimms Weinstein intermittent discomfort but he did play golf yesterday and played well without any pain. He is scheduled to have left foot hammertoe surgery tomorrow he would proceed with this. Denies any systemic complaints such as fevers, chills, nausea, vomiting. No calf pain, chest pain, shortness of breath.   Objective: General: No acute distress, AAOx3 ; wearing a regular shoe on the right foot.  DP/PT pulses palpable 2/4, CRT < 3 sec to all digits.  Protective sensation intact. Motor function intact.  right foot: Incision is well coapted without any evidence of dehiscence and a scar has formed.There is no surrounding erythema, ascending cellulitis, fluctuance, crepitus, malodor, drainage/purulence. There is minimal edema around the toes. There is no pain along the surgical site. Toes sit in a rectus position.  Left foot appears to be unchanged with continuation of semirigid hammertoe contractures with tenderness.  No other areas of tenderness to bilateral lower extremities.  No other open lesions or pre-ulcerative lesions.  No pain with calf compression, swelling, warmth, erythema.   Assessment and Plan:  Status post right hammertoe surgery, doing well with no complications   -Treatment options discussed including all alternatives, risks, and complications -X-rays were obtained and reviewed with the patient. No evidence of acute fracture. Toes in rectus position. -Continue regular shoe gear on the right side. I would limit activity however. -Discussed to have left foot surgery to mark. The go ahead and provide prescriptions for Percocet as well as for Keflex. He had no further questions about the surgery tomorrow and I again discussed with him the surgery. -Follow-up next week  for postop visit #1 left foot.  Ovid CurdMatthew Wagoner, DPM

## 2016-01-18 ENCOUNTER — Encounter: Payer: Self-pay | Admitting: Podiatry

## 2016-01-18 DIAGNOSIS — M2042 Other hammer toe(s) (acquired), left foot: Secondary | ICD-10-CM | POA: Diagnosis not present

## 2016-01-19 ENCOUNTER — Telehealth: Payer: Self-pay | Admitting: *Deleted

## 2016-01-19 NOTE — Telephone Encounter (Signed)
Post op courtesy call-Pt states he is doing fantastic, the little ball on the pin in his toe came off.  I told pt he could gently put the pin back on, but not to push the pin into the toe.  I instructed RICE instructions, not to be up on his foot or dangling his foot more than 15 mins/hour, to keep the original surgical dressing clean, dry and in place until changed at the 1st post op visit, remain in the boot at all times even to sleep, may open boot if resting and rotate ankle and foot, then replace in the boot, and take the pain medication as directed, call with concerns.  Pt states understanding.

## 2016-01-20 NOTE — Progress Notes (Signed)
DOS 01/18/2016 Hammer toe repair of toes 2, 3, 4, 5th left foot with K-wire fixation.

## 2016-01-26 ENCOUNTER — Encounter: Payer: Self-pay | Admitting: Podiatry

## 2016-01-26 ENCOUNTER — Ambulatory Visit (INDEPENDENT_AMBULATORY_CARE_PROVIDER_SITE_OTHER): Payer: BLUE CROSS/BLUE SHIELD | Admitting: Podiatry

## 2016-01-26 ENCOUNTER — Ambulatory Visit (INDEPENDENT_AMBULATORY_CARE_PROVIDER_SITE_OTHER): Payer: BLUE CROSS/BLUE SHIELD

## 2016-01-26 VITALS — BP 144/87 | HR 68 | Resp 18

## 2016-01-26 DIAGNOSIS — M2042 Other hammer toe(s) (acquired), left foot: Secondary | ICD-10-CM

## 2016-01-26 DIAGNOSIS — Z9889 Other specified postprocedural states: Secondary | ICD-10-CM

## 2016-01-26 NOTE — Progress Notes (Signed)
Patient ID: Kayla L [place], male   DOB: 08-25-1951, 65 y.o.   MRN: [id number]  Subjective: Medard L [place] is a 65 y.o. is seen today in office s/p left hammertoes 2-5 preformed on 5/140/17. He states that overall he is generally is remaining in a cam boot. He has completed antibiotics. Denies any systemic complaints such as fevers, chills, nausea, vomiting. No calf pain, chest pain, shortness of breath.   Objective: General: No acute distress, AAOx3; presents in CAM boot.  DP/PT pulses palpable 2/4, CRT < 3 sec to all digits.  Protective sensation intact. Motor function intact.  Left foot: Incision is well coapted without any evidence of dehiscence and sutures inact There is no surrounding erythema, ascending cellulitis, fluctuance, crepitus, malodor, drainage/purulence. There is mild edema around the surgical site. There is mild pain along the surgical site. Ptosis in rectus position. Right foot: Decreased onto the toes. Decreased tenderness. Toes are in rectus position. No other areas of tenderness to bilateral lower extremities.  No other open lesions or pre-ulcerative lesions.  No pain with calf compression, swelling, warmth, erythema.   Assessment and Plan:  Status post left hammertoe repair, doing well with no complications   -Treatment options discussed including all alternatives, risks, and complications -X-rays were obtained and reviewed with the patient. Toes are rectus. K wires not straighten the second of the toe however the toes clinically are in good position. -Antibiotic laden was applied followed by dressing. Keep the dressing clean, dry, intact. -Ice/elevation -Pain medication as needed. -Monitor for any clinical signs or symptoms of infection and DVT/PE and directed to call the office immediately should any occur or go to the ER. -Follow-up in 1 week for suture removal or sooner if any problems arise. In the meantime, encouraged to call the office with any questions, concerns,  change in symptoms.   Ovid CurdMatthew Wagoner, DPM

## 2016-02-02 ENCOUNTER — Encounter: Payer: Self-pay | Admitting: Podiatry

## 2016-02-02 ENCOUNTER — Ambulatory Visit (INDEPENDENT_AMBULATORY_CARE_PROVIDER_SITE_OTHER): Payer: BLUE CROSS/BLUE SHIELD | Admitting: Podiatry

## 2016-02-02 DIAGNOSIS — L600 Ingrowing nail: Secondary | ICD-10-CM | POA: Diagnosis not present

## 2016-02-02 DIAGNOSIS — Z9889 Other specified postprocedural states: Secondary | ICD-10-CM

## 2016-02-02 DIAGNOSIS — M2042 Other hammer toe(s) (acquired), left foot: Secondary | ICD-10-CM

## 2016-02-02 NOTE — Progress Notes (Signed)
Patient ID: [place] L [place], male   DOB: 1950/10/10, 65 y.o.   MRN: [id number]  Subjective: Kasai L [place] is a 65 y.o. is seen today in office s/p left hammertoes 2-5 preformed on 5/140/17. He presents today for suture removal. He is doing well and not taking pain medication. He does state some pain to the right big toenails as well as last appointment on the inside border. No drainage or pus or any swelling or redness. The right side is improving. Swelling is improved. Denies any systemic complaints such as fevers, chills, nausea, vomiting. No calf pain, chest pain, shortness of breath.   Objective: General: No acute distress, AAOx3; presents in CAM boot.  DP/PT pulses palpable 2/4, CRT < 3 sec to all digits.  Protective sensation intact. Motor function intact.  Left foot: Incision is well coapted without any evidence of dehiscence and sutures intact. There is no surrounding erythema, ascending cellulitis, fluctuance, crepitus, malodor, drainage/purulence. There is mild edema around the surgical site. There is decreased pain along the surgical site. Toes are in rectus position. Right foot: Decreased onto the toes. Decreased tenderness. Toes are in rectus position. There is incurvation along the lateral distal nail border. No drainage or pus any swelling or redness. Tenderness overlying the distal portion of the lateral right hallux toenail. No other areas of tenderness to bilateral lower extremities.  No other open lesions or pre-ulcerative lesions.  No pain with calf compression, swelling, warmth, erythema.   Assessment and Plan:  Status post left hammertoe repair, doing well with no complications; right hallux ingrown toenail  -Treatment options discussed including all alternatives, risks, and complications -Sutures removed on the left toes. In about ointment was applied followed by dressing. Continue cam boot. Ice and elevation. Pain medication as needed. -Right hallux lateral nail border was  debrided without complications or bleeding to remove the symptomatic portion of ingrown toenail. He mainly partial nail avulsion in the future. Epson salt soaks. Monitor for signs or symptoms of infection. -Continue ice and elevation for the right side limit activity. -Follow-up 2 weeks.  X-rays next appointment of left foot.  Ovid CurdMatthew Wagoner, DPM

## 2016-02-10 ENCOUNTER — Other Ambulatory Visit: Payer: Self-pay | Admitting: Family Medicine

## 2016-02-16 ENCOUNTER — Ambulatory Visit (INDEPENDENT_AMBULATORY_CARE_PROVIDER_SITE_OTHER): Payer: BLUE CROSS/BLUE SHIELD | Admitting: Podiatry

## 2016-02-16 ENCOUNTER — Ambulatory Visit (INDEPENDENT_AMBULATORY_CARE_PROVIDER_SITE_OTHER): Payer: BLUE CROSS/BLUE SHIELD

## 2016-02-16 ENCOUNTER — Encounter: Payer: Self-pay | Admitting: Podiatry

## 2016-02-16 DIAGNOSIS — M2042 Other hammer toe(s) (acquired), left foot: Secondary | ICD-10-CM

## 2016-02-16 DIAGNOSIS — Z9889 Other specified postprocedural states: Secondary | ICD-10-CM

## 2016-02-16 MED ORDER — DOXYCYCLINE HYCLATE 100 MG PO TABS: 100.0000 mg | ORAL_TABLET | Freq: Two times a day (BID) | ORAL | Status: DC

## 2016-02-19 NOTE — Progress Notes (Signed)
Patient ID: Warren [name], male   DOB: 1951-03-09, 65 y.o.   MRN: [id number]  Subjective: Warren [name] is a 65 y.o. is seen today in office s/p left hammertoes 2-5 preformed on 01/18/16. He did get his left foot where yesterday he believes he may have hit his foot and the wire to the fourth toe got pushed inward causing more discomfort. He is continued surgical shoe. Right foot continues to improve. Denies any systemic complaints such as fevers, chills, nausea, vomiting. No calf pain, chest pain, shortness of breath.   Objective: General: No acute distress, AAOx3; presents in CAM boot.  DP/PT pulses palpable 2/4, CRT < 3 sec to all digits.  Protective sensation intact. Motor function intact.  Left foot: Incision is well coapted without any evidence of dehiscence and scars have formed. K wires are intact to the toes however the fourth K wire has been post inwards in the second digit K wire has come out some. There is no drainage or pus around the incisions or along the K wire sites. The left fourth digit has shifted slightly however the toes otherwise remain in rectus position. No other areas of tenderness to bilateral lower extremities.  No other open lesions or pre-ulcerative lesions.  No pain with calf compression, swelling, warmth, erythema.   Assessment and Plan:  Status post left hammertoe repair  -Treatment options discussed including all alternatives, risks, and complications -X-rays were obtained and reviewed with the patient. K wire the fourth toe is been pushed in and the K wire the second toe is not holding. No evidence of acute fracture. -At this time the K wire to the second and fourth toe were removed in total without complications. Antibiotic ointment was applied followed by dressing. -Splinting of the fourth toe -Continue surgical shoe.  -Ice/elevation -Pain medication as needed -Follow-up in 2 weeks or sooner if any problems arise. In the meantime, encouraged to call the  office with any questions, concerns, change in symptoms.  *x-ray next appointment  Ovid CurdMatthew Wagoner, DPM

## 2016-02-27 ENCOUNTER — Ambulatory Visit (INDEPENDENT_AMBULATORY_CARE_PROVIDER_SITE_OTHER): Payer: BLUE CROSS/BLUE SHIELD | Admitting: Family Medicine

## 2016-02-27 ENCOUNTER — Encounter: Payer: Self-pay | Admitting: Family Medicine

## 2016-02-27 VITALS — BP 138/84 | HR 69 | Temp 98.1°F | Resp 18 | Ht 72.0 in | Wt 300.1 lb

## 2016-02-27 DIAGNOSIS — I10 Essential (primary) hypertension: Secondary | ICD-10-CM | POA: Diagnosis not present

## 2016-02-27 DIAGNOSIS — E7800 Pure hypercholesterolemia, unspecified: Secondary | ICD-10-CM | POA: Diagnosis not present

## 2016-02-27 DIAGNOSIS — N529 Male erectile dysfunction, unspecified: Secondary | ICD-10-CM | POA: Diagnosis not present

## 2016-02-27 DIAGNOSIS — Z9889 Other specified postprocedural states: Secondary | ICD-10-CM

## 2016-02-27 HISTORY — DX: Male erectile dysfunction, unspecified: N52.9

## 2016-02-27 NOTE — Progress Notes (Signed)
Date:  02/27/2016   Name:  Warren [name]   DOB:  05-04-1951   MRN:  [id number]  PCP:  Dennison Mascot, MD    Chief Complaint: Hypertension and Medication Refill   History of Present Illness:  This is a 65 y.o. male seen for 8 month f/u. Has had multiple foot surgeries since March and has gain 20# due to inactivity. Should be able to start exercising again next week. Takes Cialis weekly for ED (effective) and fish oil for HLD. On daily asa but not sure why. On Hyzaar and amlodipine for BP. Home BP's usually in 130's/70's. No new sxs or current concerns.  Review of Systems:  Review of Systems  Constitutional: Negative for fever and fatigue.  Respiratory: Negative for cough and shortness of breath.   Cardiovascular: Negative for chest pain and leg swelling.  Endocrine: Negative for polyuria.  Genitourinary: Negative for difficulty urinating.  Neurological: Negative for syncope and light-headedness.    Patient Active Problem List   Diagnosis Date Noted  . Erectile dysfunction 02/27/2016  . Status post left foot surgery 01/26/2016  . Hammer toe of left foot 12/25/2015  . Hammertoe 11/29/2015  . Status post right foot surgery 11/29/2015  . Obesity, Class III, BMI 40-49.9 (morbid obesity) (HCC) 02/24/2015  . Chondromalacia 02/24/2015  . Obstructive sleep apnea of adult 02/24/2015  . Screening examination for poliomyelitis 02/24/2015  . Acquired acanthosis nigricans 02/01/2010  . Flat foot 07/05/2008  . Allergic rhinitis 01/12/2008  . Decreased libido 01/06/2008  . Hypercholesterolemia without hypertriglyceridemia 10/08/2007  . Benign essential HTN 08/13/2007    Prior to Admission medications   Medication Sig Start Date End Date Taking? Authorizing Provider  amLODipine (NORVASC) 10 MG tablet TAKE 1 TABLET DAILY 02/15/16   Dennison Mascot, MD  aspirin (ASPIRIN ADULT LOW STRENGTH) 81 MG EC tablet Take by mouth. 02/03/14   Historical Provider, MD  losartan-hydrochlorothiazide (HYZAAR)  100-25 MG tablet TAKE 1 TABLET DAILY 10/25/15   Dennison Mascot, MD  OMEGA-3 FATTY ACIDS PO  04/06/08   Historical Provider, MD  tadalafil (CIALIS) 20 MG tablet Take 1 tablet (20 mg total) by mouth daily as needed for erectile dysfunction. 03/30/15   Dennison Mascot, MD    No Known Allergies  Past Surgical History  Procedure Laterality Date  . Foot surgery    . Foot surgery Bilateral     March and May    Social History  Substance Use Topics  . Smoking status: Never Smoker   . Smokeless tobacco: None  . Alcohol Use: 0.0 oz/week    0 Standard drinks or equivalent per week     Comment: occasional wine    Family History  Problem Relation Age of Onset  . Heart disease Mother     Medication list has been reviewed and updated.  Physical Examination: BP 138/84 mmHg  Pulse 69  Temp(Src) 98.1 F (36.7 C)  Resp 18  Ht 6' (1.829 m)  Wt 300 lb 2 oz (136.136 kg)  BMI 40.70 kg/m2  SpO2 96%  Physical Exam  Constitutional: He appears well-developed and well-nourished.  Cardiovascular: Normal rate, regular rhythm and normal heart sounds.   Pulmonary/Chest: Effort normal and breath sounds normal.  Musculoskeletal: He exhibits no edema.  Neurological: He is alert.  Skin: Skin is warm and dry.  Psychiatric: He has a normal mood and affect. His behavior is normal.  Nursing note and vitals reviewed.   Assessment and Plan:  1. Benign essential HTN Well controlled, cont Hyzaar/amlodipine  2. Erectile dysfunction, unspecified erectile dysfunction type Well controlled, cont Cialis prn  3. Hypercholesterolemia without hypertriglyceridemia Likely less well controlled given weight gain, consider recheck next visit  4. Obesity, Class III, BMI 40-49.9 (morbid obesity) (HCC) Discussed exercise/weight loss  5. Status post left foot surgery Followed by podiatry  6. Med review Unclear need for asa, consider d/c next visit   Return in about 3 months (around 05/29/2016).  Dionne AnoWilliam M.  Kingsley SpittlePlonk, Jr. MD [facility]  02/27/2016

## 2016-03-01 ENCOUNTER — Ambulatory Visit (INDEPENDENT_AMBULATORY_CARE_PROVIDER_SITE_OTHER): Payer: BLUE CROSS/BLUE SHIELD

## 2016-03-01 ENCOUNTER — Encounter: Payer: Self-pay | Admitting: Podiatry

## 2016-03-01 ENCOUNTER — Ambulatory Visit (INDEPENDENT_AMBULATORY_CARE_PROVIDER_SITE_OTHER): Payer: BLUE CROSS/BLUE SHIELD | Admitting: Podiatry

## 2016-03-01 DIAGNOSIS — M2042 Other hammer toe(s) (acquired), left foot: Secondary | ICD-10-CM

## 2016-03-01 DIAGNOSIS — Z9889 Other specified postprocedural states: Secondary | ICD-10-CM

## 2016-03-05 NOTE — Progress Notes (Signed)
Patient ID: Warren [name], male   DOB: 06-28-1951, 65 y.o.   MRN: [id number]  Subjective: Warren [name] is a 65 y.o. is seen today in office s/p left hammertoes 2-5 preformed on 01/18/16. He presents today to remove the remaining K wire in the left foot. He is wearing the surgical shoe. He is having minimal discomfort to his foot. He occasionally gets and swelling to the toes on the right foot. No other complaints at this time. Denies any systemic complaints such as fevers, chills, nausea, vomiting. No calf pain, chest pain, shortness of breath.   Objective: General: No acute distress, AAOx3; presents in CAM boot.  DP/PT pulses palpable 2/4, CRT < 3 sec to all digits.  Protective sensation intact. Motor function intact.  Left foot: Incision is well coapted without any evidence of dehiscence and scars have formed. K wires are intact to the 3rd toe. Toes remain in rectus position. Mild swelling to the toes present bilaterally. No erythema or increase in warmth. No other areas of tenderness to bilateral lower extremities.  No other open lesions or pre-ulcerative lesions.  No pain with calf compression, swelling, warmth, erythema.   Assessment and Plan:  Status post left hammertoe repair  -Treatment options discussed including all alternatives, risks, and complications -X-rays were obtained and reviewed with the patient. K wire intact the third digit. No evidence of acute fracture. Evidence of prior hammertoe repair -K wire the left foot removed in total without couple complications. Antibiotic was applied followed by dressing. -He can start to transition to regular shoe as tolerated. -Ice and elevation. -I showed the patient had a take the toes to help with swelling. -Follow-up in 4 weeks or sooner if any problems arise. In the meantime, encouraged to call the office with any questions, concerns, change in symptoms.   Ovid CurdMatthew Wagoner, DPM

## 2016-03-26 ENCOUNTER — Other Ambulatory Visit: Payer: Self-pay | Admitting: Family Medicine

## 2016-04-05 ENCOUNTER — Ambulatory Visit (INDEPENDENT_AMBULATORY_CARE_PROVIDER_SITE_OTHER): Payer: BLUE CROSS/BLUE SHIELD

## 2016-04-05 ENCOUNTER — Ambulatory Visit (INDEPENDENT_AMBULATORY_CARE_PROVIDER_SITE_OTHER): Payer: BLUE CROSS/BLUE SHIELD | Admitting: Podiatry

## 2016-04-05 ENCOUNTER — Encounter: Payer: Self-pay | Admitting: Podiatry

## 2016-04-05 DIAGNOSIS — Z09 Encounter for follow-up examination after completed treatment for conditions other than malignant neoplasm: Secondary | ICD-10-CM

## 2016-04-05 DIAGNOSIS — M79673 Pain in unspecified foot: Secondary | ICD-10-CM

## 2016-04-05 DIAGNOSIS — M2042 Other hammer toe(s) (acquired), left foot: Secondary | ICD-10-CM

## 2016-04-05 DIAGNOSIS — Z9889 Other specified postprocedural states: Secondary | ICD-10-CM

## 2016-04-05 NOTE — Progress Notes (Signed)
Patient ID: Warren [name] [place], male   DOB: 1951/02/15, 65 y.o.   MRN: [id number]  Subjective: Warren [name] [place] is a 65 y.o. is seen today in office s/p left hammertoes 2-5 preformed on 01/18/16. He states that overall he is doing better. He was having some throbbing sensation to his toes but this is resolved. He states the majority of pain is to both of his knees is requesting a steroid injection to his knees. He has been able to wear regular shoe although he does feel he has some swelling towards the day. He isn't painful ball of his foot as well. No recent injury or trauma. Denies any systemic complaints such as fevers, chills, nausea, vomiting. No calf pain, chest pain, shortness of breath.   Objective: General: No acute distress, AAOx3; presents in CAM boot.  DP/PT pulses palpable 2/4, CRT < 3 sec to all digits.  Protective sensation intact. Motor function intact.  Left and Right foot: Incision is well coapted without any evidence of dehiscence and scars have formed. Toes remain in rectus position. Minimal swelling to the toes present bilaterally. No erythema or increase in warmth. No specific tenderness the toes. Mild diffuse tenderness of metatarsals 1-5 bilaterally. No specific area pinpoint tenderness is or sensation. No other areas of tenderness to bilateral lower extremities.  No other open lesions or pre-ulcerative lesions.  No pain with calf compression, swelling, warmth, erythema.   Assessment and Plan:  Status post left hammertoe repair, doing well   -Treatment options discussed including all alternatives, risks, and complications -X-rays were obtained and reviewed with the patient. Toes rectus. No evidence of acute fracture. -At this time continue with regular shoe gear. Discussed with him a wider shoe. Dispensed metatarsal pads as well as toe crests. -Discussed with him that I am unable to do steroid injections into the knees and he needs to follow-up with orthopedic should his knees  continue to be symptomatic. He understands this. -Continue with taping the toes as needed to help with swelling. -Follow-up in 4 weeks or sooner if any problems arise. In the meantime, encouraged to call the office with any questions, concerns, change in symptoms. I believe he'll be able to  return to work within the next month due to foot surgery.  Ovid Curd, DPM

## 2016-04-10 DIAGNOSIS — M79673 Pain in unspecified foot: Secondary | ICD-10-CM

## 2016-04-18 NOTE — Telephone Encounter (Signed)
Needing refill

## 2016-04-22 ENCOUNTER — Other Ambulatory Visit: Payer: Self-pay | Admitting: Family Medicine

## 2016-05-08 ENCOUNTER — Ambulatory Visit: Payer: BLUE CROSS/BLUE SHIELD

## 2016-05-08 ENCOUNTER — Encounter: Payer: Self-pay | Admitting: Podiatry

## 2016-05-08 ENCOUNTER — Ambulatory Visit (INDEPENDENT_AMBULATORY_CARE_PROVIDER_SITE_OTHER): Payer: BLUE CROSS/BLUE SHIELD | Admitting: Podiatry

## 2016-05-08 ENCOUNTER — Ambulatory Visit (INDEPENDENT_AMBULATORY_CARE_PROVIDER_SITE_OTHER): Payer: BLUE CROSS/BLUE SHIELD

## 2016-05-08 DIAGNOSIS — M7740 Metatarsalgia, unspecified foot: Secondary | ICD-10-CM | POA: Diagnosis not present

## 2016-05-08 DIAGNOSIS — M2042 Other hammer toe(s) (acquired), left foot: Secondary | ICD-10-CM

## 2016-05-08 DIAGNOSIS — Z09 Encounter for follow-up examination after completed treatment for conditions other than malignant neoplasm: Secondary | ICD-10-CM

## 2016-05-08 NOTE — Progress Notes (Signed)
Patient ID: Warren [name] [name], male   DOB: 02-08-1951, 65 y.o.   MRN: [id number]  Subjective: Warren [name] is a 65 y.o. is seen today in office s/p left hammertoes 2-5 preformed on 01/18/16 he states he is having 0 pain to the left foot. He does that he continues to get some discomfort of the right foot although this is improving. He states the field of the toes are trying to draw back up he gets some pain to the ball of his foot. He has worked the [employer]ist[employer] pads which does help. He is also seeing today he is having some right knee pain which may be out of his symptoms. Denies any recent injury or trauma. He gets occasional swelling to the toes and the right foot. No redness or warmth. No recent injury or trauma.. Denies any systemic complaints such as fevers, chills, nausea, vomiting. No calf pain, chest pain, shortness of breath.   Objective: General: No acute distress, AAOx3; presents in CAM boot.  DP/PT pulses palpable 2/4, CRT < 3 sec to all digits.  Protective sensation intact. Motor function intact.  Left and Right foot: Incision is well coapted without any evidence of dehiscence and scars have formed. Toes remain in rectus position on the left hilar some mild recurrence on the right side. There is minimal edema to the toes on the right foot without any erythema or increase in warmth. There's some mild diffuse tenderness along the plantar aspect of submetatarsal 1-5 on the right foot however there is no specific area pinpoint bony tenderness or pain the vibratory sensation. There is no edema to this area. No other areas of tenderness bilaterally. No other open lesions or pre-ulcerative lesions.  No pain with calf compression, swelling, warmth, erythema.   Assessment and Plan:  Status post bilateral hammertoe repair  -Treatment options discussed including all alternatives, risks, and complications -X-rays were obtained and reviewed with the patient.No evidence of acute fracture. -Dispensed  metatarsal offloading pads as well as toe crest for the right side. Discussed toe exercises. Ice and elevation. -He states that he can stand for about 2 hours before he starts to get discomfort to his right foot. At this time he can return to work however he periodic breaks to sit. He can stand for possibly 1.5 hours and thenshould sit for 30 minutes. He states that even before the surgery he was to have to decrease his time at work given the pain to his toes. -Continue anti-inflammatories. -Follow up in 6 weeks or sooner. She's are to arise. In the meantime call any questions, concerns or any change in symptoms.   *She's having some right knee pain and swelling as well which is been ongoing. Referred him to Dewaine CongerMurphy Weiner (provided phone number).   Ovid CurdMatthew Aveleen Nevers, DPM

## 2016-05-15 ENCOUNTER — Ambulatory Visit (INDEPENDENT_AMBULATORY_CARE_PROVIDER_SITE_OTHER): Payer: BLUE CROSS/BLUE SHIELD | Admitting: Family Medicine

## 2016-05-15 ENCOUNTER — Telehealth: Payer: Self-pay | Admitting: Podiatry

## 2016-05-15 ENCOUNTER — Encounter: Payer: Self-pay | Admitting: Family Medicine

## 2016-05-15 ENCOUNTER — Other Ambulatory Visit: Payer: Self-pay | Admitting: Family Medicine

## 2016-05-15 VITALS — BP 120/60 | HR 74 | Temp 98.3°F | Wt 288.0 lb

## 2016-05-15 DIAGNOSIS — M170 Bilateral primary osteoarthritis of knee: Secondary | ICD-10-CM | POA: Diagnosis not present

## 2016-05-15 DIAGNOSIS — M1710 Unilateral primary osteoarthritis, unspecified knee: Secondary | ICD-10-CM | POA: Insufficient documentation

## 2016-05-15 DIAGNOSIS — Z5181 Encounter for therapeutic drug level monitoring: Secondary | ICD-10-CM | POA: Insufficient documentation

## 2016-05-15 DIAGNOSIS — E7800 Pure hypercholesterolemia, unspecified: Secondary | ICD-10-CM

## 2016-05-15 DIAGNOSIS — I10 Essential (primary) hypertension: Secondary | ICD-10-CM

## 2016-05-15 DIAGNOSIS — M179 Osteoarthritis of knee, unspecified: Secondary | ICD-10-CM | POA: Insufficient documentation

## 2016-05-15 DIAGNOSIS — E669 Obesity, unspecified: Secondary | ICD-10-CM

## 2016-05-15 LAB — COMPLETE METABOLIC PANEL WITH GFR
ALT: 32 U/L (ref 9–46)
AST: 23 U/L (ref 10–35)
Albumin: 4.3 g/dL (ref 3.6–5.1)
Alkaline Phosphatase: 50 U/L (ref 40–115)
BUN: 16 mg/dL (ref 7–25)
CALCIUM: 9.3 mg/dL (ref 8.6–10.3)
CHLORIDE: 103 mmol/L (ref 98–110)
CO2: 28 mmol/L (ref 20–31)
CREATININE: 1.19 mg/dL (ref 0.70–1.25)
GFR, Est African American: 75 mL/min (ref >=60)
GFR, Est Non African American: 65 mL/min (ref >=60)
GLUCOSE: 90 mg/dL (ref 65–99)
Potassium: 3.9 mmol/L (ref 3.5–5.3)
Sodium: 140 mmol/L (ref 135–146)
TOTAL PROTEIN: 7.2 g/dL (ref 6.1–8.1)
Total Bilirubin: 0.5 mg/dL (ref 0.2–1.2)

## 2016-05-15 LAB — CBC WITH DIFFERENTIAL/PLATELET
BASOS ABS: 64 {cells}/uL (ref 0–200)
BASOS PCT: 1 %
Eosinophils Absolute: 320 cells/uL (ref 15–500)
Eosinophils Relative: 5 %
HCT: 42 % (ref 38.5–50.0)
Hemoglobin: 14.6 g/dL (ref 13.2–17.1)
LYMPHS PCT: 35 %
Lymphs Abs: 2240 cells/uL (ref 850–3900)
MCH: 30 pg (ref 27.0–33.0)
MCHC: 34.8 g/dL (ref 32.0–36.0)
MCV: 86.4 fL (ref 80.0–100.0)
MONOS PCT: 7 %
MPV: 10.2 fL (ref 7.5–12.5)
Monocytes Absolute: 448 cells/uL (ref 200–950)
NEUTROS PCT: 52 %
Neutro Abs: 3328 cells/uL (ref 1500–7800)
PLATELETS: 321 10*3/uL (ref 140–400)
RBC: 4.86 MIL/uL (ref 4.20–5.80)
RDW: 13 % (ref 11.0–15.0)
WBC: 6.4 10*3/uL (ref 3.8–10.8)

## 2016-05-15 LAB — LIPID PANEL
CHOLESTEROL: 179 mg/dL (ref 125–200)
HDL: 38 mg/dL — AB (ref >=40)
LDL Cholesterol: 67 mg/dL (ref <130)
TRIGLYCERIDES: 369 mg/dL — AB (ref <150)
Total CHOL/HDL Ratio: 4.7 Ratio (ref <=5.0)
VLDL: 74 mg/dL — ABNORMAL HIGH (ref <30)

## 2016-05-15 MED ORDER — AMLODIPINE BESYLATE 5 MG PO TABS: 5.0000 mg | ORAL_TABLET | Freq: Every day | ORAL | 3 refills | Status: DC

## 2016-05-15 MED ORDER — LOSARTAN POTASSIUM-HCTZ 100-25 MG PO TABS: 1.0000 | ORAL_TABLET | Freq: Every day | ORAL | 1 refills | Status: DC

## 2016-05-15 NOTE — Progress Notes (Signed)
BP 120/60   Pulse 74   Temp 98.3 F (36.8 C)   Wt 288 lb (130.6 kg)   SpO2 95%   BMI 39.06 kg/m    Subjective:    Patient ID: Warren [name] [place], male    DOB: Jun 07, 1951, 65 y.o.   MRN: [id number]  HPI: Warren [name] [place] is a 65 y.o. male  Chief Complaint  Patient presents with  . Knee Pain    right knee but some pain in both  . Medication Refill   Patient is new to me; his usual provider is out of the office for an extended time  He has spent the last six months in surgical boots because of surgery for hammer toes; has been treated by [facility] He has bilateral knee pain; has been to see the orthopaedist and got cortisone shots in the knees on Aug 29th; he could tell a different right away; he was told he is going to need knee replacements He does not like taking pain medication; his knees were causing him pain; he is not taking anything for pain right now  He has dropped 15 pounds over the last several months; CMA Helen's words had a positive effect on him and he got serious about weight loss; he is planning on losing another 15 pounds; he likes to work out in the yard; no chest pain  His BP is well-controlled today; he needs refills of BP med; no artificial salt substitutes; does have a little salt already in the food; not adding salt to his food with a shaker; no black licorice; he does not usually get BP this low at home; it's usually 70-74 at home diastolic, not over 80; top numbers are usually 130; he thinks today is a fluke; he is not feeling dizzy and checks at home and work and drug stores  He stopped the baby aspirin after seeing last doctor here  Has hx of high cholesterol; not taking any medicine; hoping it will be better with weight loss  Depression screen [facility] 2/9 05/15/2016 02/27/2016 06/27/2015  Decreased Interest 0 0 0  Down, Depressed, Hopeless 0 0 0  PHQ - 2 Score 0 0 0   Relevant past medical, surgical, family and social history reviewed Past Medical  History:  Diagnosis Date  . Erectile dysfunction 02/27/2016  . Hypercholesterolemia without hypertriglyceridemia 10/08/2007  . Hypertension   . OA (osteoarthritis) of knee 05/15/2016   Managed by ortho  . Obesity 02/24/2015  . Obstructive sleep apnea of adult 02/24/2015   Past Surgical History:  Procedure Laterality Date  . FOOT SURGERY    . FOOT SURGERY Bilateral    March and May   Family History  Problem Relation Age of Onset  . Heart disease Mother    Social History  Substance Use Topics  . Smoking status: Never Smoker  . Smokeless tobacco: Never Used  . Alcohol use 0.0 oz/week     Comment: occasional wine   Interim medical history since last visit reviewed. Allergies and medications reviewed  Review of Systems Per HPI unless specifically indicated above     Objective:    BP 120/60   Pulse 74   Temp 98.3 F (36.8 C)   Wt 288 lb (130.6 kg)   SpO2 95%   BMI 39.06 kg/m   Wt Readings from Last 3 Encounters:  05/15/16 288 lb (130.6 kg)  02/27/16 (!) 300 lb 2 oz (136.1 kg)  06/27/15 285 lb 9.6 oz (129.5 kg)  Physical Exam  Constitutional: He appears well-developed and well-nourished. No distress.  Obese, weight down 12 pounds over last 2.5 months  HENT:  Head: Normocephalic and atraumatic.  Eyes: EOM are normal. No scleral icterus.  Neck: No thyromegaly present.  Cardiovascular: Normal rate and regular rhythm.   Pulmonary/Chest: Effort normal and breath sounds normal.  Abdominal: Soft. Bowel sounds are normal. He exhibits no distension.  Musculoskeletal: He exhibits no edema.       Right knee: He exhibits no swelling and no effusion.       Left knee: He exhibits no swelling and no effusion.  Neurological: Coordination normal.  Skin: Skin is warm and dry. No pallor.  Psychiatric: He has a normal mood and affect. His behavior is normal. Judgment and thought content normal.      Assessment & Plan:   Problem List Items Addressed This Visit       Cardiovascular and Mediastinum   Benign essential HTN - Primary    Well-controlled; try the DASH guidelines; see AVS; continue to work on weigh tloss; if he can lose more weight, we may be able to decrease or stop some of his antihypertensives; on current med, avoid salt substitutes; avoid decongestants      Relevant Medications   losartan-hydrochlorothiazide (HYZAAR) 100-25 MG tablet   amLODipine (NORVASC) 5 MG tablet     Musculoskeletal and Integument   OA (osteoarthritis) of knee    Managed by ortho; s/p cortisone injections; weight loss may help slow progression        Other   Obesity    Encouragement given for weight loss; see AVS; weight loss should help BP, lipids, arthritis, etc      Hypercholesterolemia without hypertriglyceridemia    Check lipids today with his obesity      Relevant Medications   losartan-hydrochlorothiazide (HYZAAR) 100-25 MG tablet   amLODipine (NORVASC) 5 MG tablet   Other Relevant Orders   Lipid panel (Completed)   Encounter for medication monitoring   Relevant Orders   CBC with Differential/Platelet (Completed)   COMPLETE METABOLIC PANEL WITH GFR (Completed)    Other Visit Diagnoses   None.     Follow up plan: Return 3-4 months, for hypertension and weight.  An after-visit summary was printed and given to the patient at check-out.  Please see the patient instructions which may contain other information and recommendations beyond what is mentioned above in the assessment and plan.  Meds ordered this encounter  Medications  . losartan-hydrochlorothiazide (HYZAAR) 100-25 MG tablet    Sig: Take 1 tablet by mouth daily.    Dispense:  90 tablet    Refill:  1  . amLODipine (NORVASC) 5 MG tablet    Sig: Take 1 tablet (5 mg total) by mouth daily.    Dispense:  90 tablet    Refill:  3    Lowering the dose    Orders Placed This Encounter  Procedures  . CBC with Differential/Platelet  . Lipid panel  . COMPLETE METABOLIC PANEL WITH GFR    MD note: Will get flu shot at work later in the season

## 2016-05-15 NOTE — Telephone Encounter (Signed)
Pt came in wanting to know the status on his Disability paperwork

## 2016-05-15 NOTE — Patient Instructions (Addendum)
Your goal blood pressure is less than 150 mmHg on top. Try to follow the DASH guidelines (DASH stands for Dietary Approaches to Stop Hypertension) Try to limit the sodium in your diet.  Ideally, consume less than 1.5 grams (less than 1,500mg ) per day. Do not add salt when cooking or at the table.  Check the sodium amount on labels when shopping, and choose items lower in sodium when given a choice. Avoid or limit foods that already contain a lot of sodium. Eat a diet rich in fruits and vegetables and whole grains.  Check out the information at familydoctor.org entitled "Nutrition for Weight Loss: What You Need to Know about Fad Diets" Try to lose between 1-2 pounds per week by taking in fewer calories and burning off more calories You can succeed by limiting portions, limiting foods dense in calories and fat, becoming more active, and drinking 8 glasses of water a day (64 ounces) Don't skip meals, especially breakfast, as skipping meals may alter your metabolism Do not use over-the-counter weight loss pills or gimmicks that claim rapid weight loss A healthy BMI (or body mass index) is between 18.5 and 24.9 You can calculate your ideal BMI at the NIH website JobEconomics.huhttp://www.nhlbi.nih.gov/health/educational/lose_wt/BMI/bmicalc.htm  Do start a coated aspirin 81 mg daily for cardiac protection  DASH Eating Plan DASH stands for "Dietary Approaches to Stop Hypertension." The DASH eating plan is a healthy eating plan that has been shown to reduce high blood pressure (hypertension). Additional health benefits may include reducing the risk of type 2 diabetes mellitus, heart disease, and stroke. The DASH eating plan may also help with weight loss. WHAT DO I NEED TO KNOW ABOUT THE DASH EATING PLAN? For the DASH eating plan, you will follow these general guidelines:  Choose foods with a percent daily value for sodium of less than 5% (as listed on the food label).  Use salt-free seasonings or herbs instead of  table salt or sea salt.  Check with your health care provider or pharmacist before using salt substitutes.  Eat lower-sodium products, often labeled as "lower sodium" or "no salt added."  Eat fresh foods.  Eat more vegetables, fruits, and low-fat dairy products.  Choose whole grains. Look for the word "whole" as the first word in the ingredient list.  Choose fish and skinless chicken or [place] more often than red meat. Limit fish, poultry, and meat to 6 oz (170 g) each day.  Limit sweets, desserts, sugars, and sugary drinks.  Choose heart-healthy fats.  Limit cheese to 1 oz (28 g) per day.  Eat more home-cooked food and less restaurant, buffet, and fast food.  Limit fried foods.  Cook foods using methods other than frying.  Limit canned vegetables. If you do use them, rinse them well to decrease the sodium.  When eating at a restaurant, ask that your food be prepared with less salt, or no salt if possible. WHAT FOODS CAN I EAT? Seek help from a dietitian for individual calorie needs. Grains Whole grain or whole wheat bread. Brown rice. Whole grain or whole wheat pasta. Quinoa, bulgur, and whole grain cereals. Low-sodium cereals. Corn or whole wheat flour tortillas. Whole grain cornbread. Whole grain crackers. Low-sodium crackers. Vegetables Fresh or frozen vegetables (raw, steamed, roasted, or grilled). Low-sodium or reduced-sodium tomato and vegetable juices. Low-sodium or reduced-sodium tomato sauce and paste. Low-sodium or reduced-sodium canned vegetables.  Fruits All fresh, canned (in natural juice), or frozen fruits. Meat and Other Protein Products Ground beef (85% or leaner), grass-fed  beef, or beef trimmed of fat. Skinless chicken or [place]. Ground chicken or [place]. Pork trimmed of fat. All fish and seafood. Eggs. Dried beans, peas, or lentils. Unsalted nuts and seeds. Unsalted canned beans. Dairy Low-fat dairy products, such as skim or 1% milk, 2% or reduced-fat  cheeses, low-fat ricotta or cottage cheese, or plain low-fat yogurt. Low-sodium or reduced-sodium cheeses. Fats and Oils Tub margarines without trans fats. Light or reduced-fat mayonnaise and salad dressings (reduced sodium). Avocado. Safflower, olive, or canola oils. Natural peanut or almond butter. Other Unsalted popcorn and pretzels. The items listed above may not be a complete list of recommended foods or beverages. Contact your dietitian for more options. WHAT FOODS ARE NOT RECOMMENDED? Grains White bread. White pasta. White rice. Refined cornbread. Bagels and croissants. Crackers that contain trans fat. Vegetables Creamed or fried vegetables. Vegetables in a cheese sauce. Regular canned vegetables. Regular canned tomato sauce and paste. Regular tomato and vegetable juices. Fruits Dried fruits. Canned fruit in light or heavy syrup. Fruit juice. Meat and Other Protein Products Fatty cuts of meat. Ribs, chicken wings, bacon, sausage, bologna, salami, chitterlings, fatback, hot dogs, bratwurst, and packaged luncheon meats. Salted nuts and seeds. Canned beans with salt. Dairy Whole or 2% milk, cream, half-and-half, and cream cheese. Whole-fat or sweetened yogurt. Full-fat cheeses or blue cheese. Nondairy creamers and whipped toppings. Processed cheese, cheese spreads, or cheese curds. Condiments Onion and garlic salt, seasoned salt, table salt, and sea salt. Canned and packaged gravies. Worcestershire sauce. Tartar sauce. Barbecue sauce. Teriyaki sauce. Soy sauce, including reduced sodium. Steak sauce. Fish sauce. Oyster sauce. Cocktail sauce. Horseradish. Ketchup and mustard. Meat flavorings and tenderizers. Bouillon cubes. Hot sauce. Tabasco sauce. Marinades. Taco seasonings. Relishes. Fats and Oils Butter, stick margarine, lard, shortening, ghee, and bacon fat. Coconut, palm kernel, or palm oils. Regular salad dressings. Other Pickles and olives. Salted popcorn and pretzels. The items  listed above may not be a complete list of foods and beverages to avoid. Contact your dietitian for more information. WHERE CAN I FIND MORE INFORMATION? National Heart, Lung, and Blood Institute: CablePromo.it   This information is not intended to replace advice given to you by your health care provider. Make sure you discuss any questions you have with your health care provider.   Document Released: 08/16/2011 Document Revised: 09/17/2014 Document Reviewed: 07/01/2013 Elsevier Interactive Patient Education [employer]

## 2016-05-15 NOTE — Assessment & Plan Note (Addendum)
Well-controlled; try the DASH guidelines; see AVS; continue to work on weigh tloss; if he can lose more weight, we may be able to decrease or stop some of his antihypertensives; on current med, avoid salt substitutes; avoid decongestants

## 2016-05-15 NOTE — Assessment & Plan Note (Addendum)
Encouragement given for weight loss; see AVS; weight loss should help BP, lipids, arthritis, etc

## 2016-05-15 NOTE — Assessment & Plan Note (Signed)
Check lipids today with his obesity

## 2016-05-15 NOTE — Assessment & Plan Note (Signed)
Managed by ortho; s/p cortisone injections; weight loss may help slow progression

## 2016-05-15 NOTE — Telephone Encounter (Signed)
All paperwork was faxed on 05/11/16

## 2016-05-22 ENCOUNTER — Telehealth: Payer: Self-pay | Admitting: Podiatry

## 2016-05-22 NOTE — Telephone Encounter (Signed)
I faxed over the paperwork again just now. Included was the fax confirmation showing it was received in [place] on 05/08/16. Not sure why you didn't get it. Once you have completed this, please fax back a copy to [place] for the patient to pick up per the cover sheet note.  Thanks!

## 2016-05-22 NOTE — Telephone Encounter (Signed)
I have not received anything from 05/08/16, the only thing I received from [employer] recently was a request for contact information, no forms to fill out, I did send his last office visit note, if you have the forms from 05/08/16 pls resend them so that I can fill them out

## 2016-05-22 NOTE — Telephone Encounter (Signed)
Patient came by the office asking for a copy of his disability paperwork that he dropped off on 05/08/16 for completion for [employer].  Cover sheet said that patient wanted a copy of the completed paperwork sent [facility] for him to pick up for his records. We have not received this in our office.  Could you check your records and fax us a copy of the completed forms for the patient. I will call him to advise when they are ready for pick up. Thank you!   Faxed over 05/08/16 at 13:46 pm. By Illene LabradorJazmine. Thanks!

## 2016-05-24 ENCOUNTER — Encounter: Payer: BLUE CROSS/BLUE SHIELD | Admitting: Podiatry

## 2016-06-21 ENCOUNTER — Ambulatory Visit (INDEPENDENT_AMBULATORY_CARE_PROVIDER_SITE_OTHER): Payer: BLUE CROSS/BLUE SHIELD | Admitting: Podiatry

## 2016-06-21 ENCOUNTER — Ambulatory Visit (INDEPENDENT_AMBULATORY_CARE_PROVIDER_SITE_OTHER): Payer: BLUE CROSS/BLUE SHIELD

## 2016-06-21 ENCOUNTER — Encounter: Payer: Self-pay | Admitting: Podiatry

## 2016-06-21 DIAGNOSIS — M2042 Other hammer toe(s) (acquired), left foot: Secondary | ICD-10-CM

## 2016-06-21 DIAGNOSIS — M79673 Pain in unspecified foot: Secondary | ICD-10-CM

## 2016-06-21 DIAGNOSIS — M2040 Other hammer toe(s) (acquired), unspecified foot: Secondary | ICD-10-CM

## 2016-06-21 DIAGNOSIS — Z09 Encounter for follow-up examination after completed treatment for conditions other than malignant neoplasm: Secondary | ICD-10-CM

## 2016-06-28 NOTE — Progress Notes (Signed)
Patient ID: Warren [name], male   DOB: [birth year], 65 y.o.   MRN: [id number]  Subjective: Warren [name] is a 65 y.o. is seen today in office s/p left hammertoes 2-5 preformed on 01/18/16 he states he is having no pain to the left foot and that the righ foot is doing "so much better, its like night and day". He states he has been increasing this time that he has been up on his feet. He is able to stand for about 2 hours before he starts to get some pain but he is turning more pain to his knee and this is what is limiting his activity level. Swelling to the toes is minimal. He is able to wear regular shoe. No recent injury or trauma.. Denies any systemic complaints such as fevers, chills, nausea, vomiting. No calf pain, chest pain, shortness of breath.   Objective: General: No acute distress, AAOx3; presents in CAM boot.  DP/PT pulses palpable 2/4, CRT < 3 sec to all digits.  Protective sensation intact. Motor function intact.  Left and Right foot: Incision is well coapted without any evidence of dehiscence and scars have formed. Toes remain in rectus position on the left however there issome mild recurrence on the right side. There is trace edema to the toes on the right foot without any erythema or increase in warmth. There's some mild diffuse tenderness along the plantar aspect of submetatarsal 1-5 on the right foot however there is no specific area pinpoint bony tenderness or pain the vibratory sensation. There is no edema to this area. No other areas of tenderness bilaterally. No other open lesions or pre-ulcerative lesions.  No pain with calf compression, swelling, warmth, erythema.   Assessment and Plan:  Status post bilateral hammertoe repair  -Treatment options discussed including all alternatives, risks, and complications -X-rays were obtained and reviewed with the patient. No evidence of acute fracture. -Dispensed more offloading pads to help with the toes as he states this helps quite a  bit. Continue with supportive shoe gear. Discussed stretching exercises first toes to continue with. At this point history of sustaining submetatarsal one discharge him from his postoperative care. He is in agreement to this plan. It appears that the majority of the reason why he cannot stand for long periods of time as to his knees and continue with orthopedic follow-up.  Ovid CurdMatthew Wagoner, DPM

## 2016-08-06 ENCOUNTER — Encounter: Payer: Self-pay | Admitting: Family Medicine

## 2016-08-06 ENCOUNTER — Ambulatory Visit (INDEPENDENT_AMBULATORY_CARE_PROVIDER_SITE_OTHER): Payer: BLUE CROSS/BLUE SHIELD | Admitting: Family Medicine

## 2016-08-06 ENCOUNTER — Other Ambulatory Visit: Payer: Self-pay | Admitting: Physician Assistant

## 2016-08-06 VITALS — BP 122/62 | HR 71 | Temp 97.9°F | Resp 14 | Wt 280.0 lb

## 2016-08-06 DIAGNOSIS — M170 Bilateral primary osteoarthritis of knee: Secondary | ICD-10-CM | POA: Diagnosis not present

## 2016-08-06 DIAGNOSIS — E6609 Other obesity due to excess calories: Secondary | ICD-10-CM

## 2016-08-06 DIAGNOSIS — Z23 Encounter for immunization: Secondary | ICD-10-CM

## 2016-08-06 DIAGNOSIS — R9431 Abnormal electrocardiogram [ECG] [EKG]: Secondary | ICD-10-CM | POA: Diagnosis not present

## 2016-08-06 DIAGNOSIS — Z01818 Encounter for other preprocedural examination: Secondary | ICD-10-CM

## 2016-08-06 DIAGNOSIS — I10 Essential (primary) hypertension: Secondary | ICD-10-CM

## 2016-08-06 DIAGNOSIS — Z6837 Body mass index (BMI) 37.0-37.9, adult: Secondary | ICD-10-CM

## 2016-08-06 NOTE — Assessment & Plan Note (Signed)
Worn out knee, due for replacement; will give medical clearance, surgical clearance per Dr. Juliann Paresallwood

## 2016-08-06 NOTE — Patient Instructions (Addendum)
We'll provide medical clearance and we'll ask Dr. Juliann Paresallwood to provide cardiac clearance this week for your surgery Keep up the great job with weight loss and your efforts at healthier eating Recheck fasting cholesterol in January or early February You received the flu shot today; it should protect you against the flu virus over the coming months; it will take about two weeks for antibodies to develop; do try to stay away from hospitals, nursing homes, and daycares during peak flu season; taking extra vitamin C daily during flu season may help you avoid getting sick   Food Choices to Lower Your Triglycerides Triglycerides are a type of fat in your blood. High levels of triglycerides can increase the risk of heart disease and stroke. If your triglyceride levels are high, the foods you eat and your eating habits are very important. Choosing the right foods can help lower your triglycerides. What general guidelines do I need to follow?  Lose weight if you are overweight.  Limit or avoid alcohol.  Fill one half of your plate with vegetables and green salads.  Limit fruit to two servings a day. Choose fruit instead of juice.  Make one fourth of your plate whole grains. Look for the word "whole" as the first word in the ingredient list.  Fill one fourth of your plate with lean protein foods.  Enjoy fatty fish (such as salmon, mackerel, sardines, and tuna) three times a week.  Choose healthy fats.  Limit foods high in starch and sugar.  Eat more home-cooked food and less restaurant, buffet, and fast food.  Limit fried foods.  Cook foods using methods other than frying.  Limit saturated fats.  Check ingredient lists to avoid foods with partially hydrogenated oils (trans fats) in them. What foods can I eat? Grains  Whole grains, such as whole wheat or whole grain breads, crackers, cereals, and pasta. Unsweetened oatmeal, bulgur, barley, quinoa, or brown rice. Corn or whole wheat flour  tortillas. Vegetables  Fresh or frozen vegetables (raw, steamed, roasted, or grilled). Green salads. Fruits  All fresh, canned (in natural juice), or frozen fruits. Meat and Other Protein Products  Ground beef (85% or leaner), grass-fed beef, or beef trimmed of fat. Skinless chicken or [place]. Ground chicken or [place]. Pork trimmed of fat. All fish and seafood. Eggs. Dried beans, peas, or lentils. Unsalted nuts or seeds. Unsalted canned or dry beans. Dairy  Low-fat dairy products, such as skim or 1% milk, 2% or reduced-fat cheeses, low-fat ricotta or cottage cheese, or plain low-fat yogurt. Fats and Oils  Tub margarines without trans fats. Light or reduced-fat mayonnaise and salad dressings. Avocado. Safflower, olive, or canola oils. Natural peanut or almond butter. The items listed above may not be a complete list of recommended foods or beverages. Contact your dietitian for more options.  What foods are not recommended? Grains  White bread. White pasta. White rice. Cornbread. Bagels, pastries, and croissants. Crackers that contain trans fat. Vegetables  White potatoes. Corn. Creamed or fried vegetables. Vegetables in a cheese sauce. Fruits  Dried fruits. Canned fruit in light or heavy syrup. Fruit juice. Meat and Other Protein Products  Fatty cuts of meat. Ribs, chicken wings, bacon, sausage, bologna, salami, chitterlings, fatback, hot dogs, bratwurst, and packaged luncheon meats. Dairy  Whole or 2% milk, cream, half-and-half, and cream cheese. Whole-fat or sweetened yogurt. Full-fat cheeses. Nondairy creamers and whipped toppings. Processed cheese, cheese spreads, or cheese curds. Sweets and Desserts  Corn syrup, sugars, honey, and molasses. Candy. Jam  and jelly. Syrup. Sweetened cereals. Cookies, pies, cakes, donuts, muffins, and ice cream. Fats and Oils  Butter, stick margarine, lard, shortening, ghee, or bacon fat. Coconut, palm kernel, or palm oils. Beverages  Alcohol. Sweetened  drinks (such as sodas, lemonade, and fruit drinks or punches). The items listed above may not be a complete list of foods and beverages to avoid. Contact your dietitian for more information.  This information is not intended to replace advice given to you by your health care provider. Make sure you discuss any questions you have with your health care provider. Document Released: 06/14/2004 Document Revised: 02/02/2016 Document Reviewed: 07/01/2013 Elsevier Interactive Patient Education  2017 [employer]

## 2016-08-06 NOTE — H&P (Signed)
TOTAL KNEE ADMISSION H&P  Patient is being admitted for right total knee arthroplasty.  Subjective:  Chief Complaint:right knee pain.  HPI: Warren [name], 65 y.o. male, has a history of pain and functional disability in the right knee due to arthritis and has failed non-surgical conservative treatments for greater than 12 weeks to includeNSAID's and/or analgesics and corticosteriod injections.  Onset of symptoms was gradual, starting 5 years ago with gradually worsening course since that time. The patient noted no past surgery on the right knee(s).  Patient currently rates pain in the right knee(s) at 6 out of 10 with activity. Patient has night pain and pain that interferes with activities of daily living.  Patient has evidence of subchondral sclerosis and joint space narrowing by imaging studies. There is no active infection.  Patient Active Problem List   Diagnosis Date Noted  . Encounter for medication monitoring 05/15/2016  . OA (osteoarthritis) of knee 05/15/2016  . Erectile dysfunction 02/27/2016  . Status post left foot surgery 01/26/2016  . Hammer toe of left foot 12/25/2015  . Hammertoe 11/29/2015  . Status post right foot surgery 11/29/2015  . Obesity 02/24/2015  . Chondromalacia 02/24/2015  . Obstructive sleep apnea of adult 02/24/2015  . Screening examination for poliomyelitis 02/24/2015  . Acquired acanthosis nigricans 02/01/2010  . Flat foot 07/05/2008  . Allergic rhinitis 01/12/2008  . Decreased libido 01/06/2008  . Hypercholesterolemia without hypertriglyceridemia 10/08/2007  . Benign essential HTN 08/13/2007   Past Medical History:  Diagnosis Date  . Erectile dysfunction 02/27/2016  . Hypercholesterolemia without hypertriglyceridemia 10/08/2007  . Hypertension   . OA (osteoarthritis) of knee 05/15/2016   Managed by ortho  . Obesity 02/24/2015  . Obstructive sleep apnea of adult 02/24/2015    Past Surgical History:  Procedure Laterality Date  . FOOT SURGERY    .  FOOT SURGERY Bilateral    March and May    No prescriptions prior to admission.   No Known Allergies  Social History  Substance Use Topics  . Smoking status: Never Smoker  . Smokeless tobacco: Never Used  . Alcohol use 0.0 oz/week     Comment: occasional wine    Family History  Problem Relation Age of Onset  . Heart disease Mother      Review of Systems  Constitutional: Negative.   HENT: Negative.   Eyes: Negative.   Respiratory: Negative.   Cardiovascular: Negative.   Gastrointestinal: Negative.   Genitourinary: Negative.   Musculoskeletal: Positive for joint pain.  Skin: Negative.   Neurological: Negative.   Endo/Heme/Allergies: Negative.   Psychiatric/Behavioral: Negative.     Objective:  Physical Exam  Constitutional: He is oriented to person, place, and time. He appears well-developed and well-nourished.  HENT:  Head: Normocephalic and atraumatic.  Eyes: EOM are normal. Pupils are equal, round, and reactive to light.  Neck: Normal range of motion. Neck supple.  Cardiovascular: Normal rate and regular rhythm.   Respiratory: Effort normal and breath sounds normal.  GI: Soft. Bowel sounds are normal.  Musculoskeletal:  Varus thrust, both sides.  Both knees have 5 degrees of varus.  Tibiofemoral and patellofemoral crepitus, but he still has full extension and about 115 degrees of flexion.    Neurological: He is alert and oriented to person, place, and time.  Skin: Skin is warm and dry.  Psychiatric: He has a normal mood and affect. His behavior is normal. Judgment and thought content normal.    Vital signs in last 24 hours:  Labs:   Estimated body mass index is 39.06 kg/m as calculated from the following:   Height as of 02/27/16: 6' (1.829 m).   Weight as of 05/15/16: 130.6 kg (288 lb).   Imaging Review Plain radiographs demonstrate severe degenerative joint disease of the right knee(s). The overall alignment ismild varus. The bone quality appears to be  fair for age and reported activity level.  Assessment/Plan:  End stage arthritis, right knee   The patient history, physical examination, clinical judgment of the provider and imaging studies are consistent with end stage degenerative joint disease of the right knee(s) and total knee arthroplasty is deemed medically necessary. The treatment options including medical management, injection therapy arthroscopy and arthroplasty were discussed at length. The risks and benefits of total knee arthroplasty were presented and reviewed. The risks due to aseptic loosening, infection, stiffness, patella tracking problems, thromboembolic complications and other imponderables were discussed. The patient acknowledged the explanation, agreed to proceed with the plan and consent was signed. Patient is being admitted for inpatient treatment for surgery, pain control, PT, OT, prophylactic antibiotics, VTE prophylaxis, progressive ambulation and ADL's and discharge planning. The patient is planning to be discharged home with home health services

## 2016-08-06 NOTE — Assessment & Plan Note (Addendum)
Similar changes but a progressed since last EKG in 2010, with T wave inversion lateral and high lateral leads; had full work-up 7 years ago, but [facility] and cardiac clearance prior to his knee replacement

## 2016-08-06 NOTE — Progress Notes (Signed)
BP 122/62   Pulse 71   Temp 97.9 F (36.6 C) (Oral)   Resp 14   Wt 280 lb (127 kg)   SpO2 94%   BMI 37.97 kg/m    Subjective:    Patient ID: Warren [name], male    DOB: 04-Oct-1950, 65 y.o.   MRN: [id number]  HPI: Warren [name] is a 65 y.o. male  Chief Complaint  Patient presents with  . SURGICAL CLEARANCE    Right KNEE REPLACEMENT   Patient is here for surgical clearance Anticipated surgery: right knee replacement; then will have left knee replacement in 6-8 weeks Surgeon: Dr. Percell Miller Date of surgery: December 27XA  Previous complications to anesthesia: no, but was told years ago that he might have had malignant hyperthermia 30 years ago; he has never had any other issues; he might have been allergic to halothane that he says isn't even used any more; he passed out at [facility], then went to [facility] and they think he passed out after exposure to that; his enzyme count wasn't high enough to warrant a bracelet; something to mention, but that was 30 years ago; patient isn't sure if this will be under general or block; he was under general with his foot surgeries and did not have any problems  Any bleeding issues: no  Infections, boils: no Cough: no Dysuria: no Hx of MRSA: no Functional ability: able to walk up flights of stairs without chest pain or [facility] He had an abnormal EKG years ago; sent him for a stress test to the hospital, Dr. Clayborn Bigness; he went there, was feeling great; working out and doing fine; he actually had a cardiac cath years ago No chest pain whatsoever  BP is doing well on the lower dose of amlodipine He has dropped 8 pounds since last visit on his scale; working on weight loss, eating better, more active No asthma, no breathing problems Uses CPAP for OSA; works well  Depression screen [facility] 2/9 08/06/2016 05/15/2016 02/27/2016 06/27/2015  Decreased Interest 0 0 0 0  Down, Depressed, Hopeless 0 0 0 0  PHQ - 2 Score 0 0 0 0   Relevant past medical, surgical, family  and social history reviewed Past Medical History:  Diagnosis Date  . Erectile dysfunction 02/27/2016  . Hypercholesterolemia without hypertriglyceridemia 10/08/2007  . Hypertension   . OA (osteoarthritis) of knee 05/15/2016   Managed by ortho  . Obesity 02/24/2015  . Obstructive sleep apnea of adult 02/24/2015   Past Surgical History:  Procedure Laterality Date  . FOOT SURGERY    . FOOT SURGERY Bilateral    March and May   Family History  Problem Relation Age of Onset  . Heart disease Mother    Social History  Substance Use Topics  . Smoking status: Never Smoker  . Smokeless tobacco: Never Used  . Alcohol use 0.0 oz/week     Comment: occasional wine   Interim medical history since last visit reviewed. Allergies and medications reviewed  Review of Systems Per HPI unless specifically indicated above     Objective:    BP 122/62   Pulse 71   Temp 97.9 F (36.6 C) (Oral)   Resp 14   Wt 280 lb (127 kg)   SpO2 94%   BMI 37.97 kg/m   Wt Readings from Last 3 Encounters:  08/06/16 280 lb (127 kg)  05/15/16 288 lb (130.6 kg)  02/27/16 (!) 300 lb 2 oz (136.1 kg)    Physical Exam  Constitutional: He appears well-developed and well-nourished. No distress.  Weight down 8 pounds over last 2-1/2 months  HENT:  Head: Normocephalic and atraumatic.  Eyes: EOM are normal. No scleral icterus.  Neck: No thyromegaly present.  Cardiovascular: Normal rate and regular rhythm.  Exam reveals no gallop.   No murmur heard. Pulmonary/Chest: Effort normal and breath sounds normal. He has no wheezes.  Abdominal: Soft. Bowel sounds are normal. He exhibits no distension.  Musculoskeletal: He exhibits no edema.  Neurological: Coordination normal.  Skin: Skin is warm and dry. No pallor.  Psychiatric: He has a normal mood and affect. His behavior is normal. Judgment and thought content normal.   Results for orders placed or performed in visit on 05/15/16  CBC with Differential/Platelet    Result Value Ref Range   WBC 6.4 3.8 - 10.8 K/uL   RBC 4.86 4.20 - 5.80 MIL/uL   Hemoglobin 14.6 13.2 - 17.1 g/dL   HCT 42.0 38.5 - 50.0 %   MCV 86.4 80.0 - 100.0 fL   MCH 30.0 27.0 - 33.0 pg   MCHC 34.8 32.0 - 36.0 g/dL   RDW 13.0 11.0 - 15.0 %   Platelets 321 140 - 400 K/uL   MPV 10.2 7.5 - 12.5 fL   Neutro Abs 3,328 1,500 - 7,800 cells/uL   Lymphs Abs 2,240 850 - 3,900 cells/uL   Monocytes Absolute 448 200 - 950 cells/uL   Eosinophils Absolute 320 15 - 500 cells/uL   Basophils Absolute 64 0 - 200 cells/uL   Neutrophils Relative % 52 %   Lymphocytes Relative 35 %   Monocytes Relative 7 %   Eosinophils Relative 5 %   Basophils Relative 1 %   Smear Review Criteria for review not met   Lipid panel  Result Value Ref Range   Cholesterol 179 125 - 200 mg/dL   Triglycerides 369 (H) <150 mg/dL   HDL 38 (L) >=40 mg/dL   Total CHOL/HDL Ratio 4.7 <=5.0 Ratio   VLDL 74 (H) <30 mg/dL   LDL Cholesterol 67 <130 mg/dL  COMPLETE METABOLIC PANEL WITH GFR  Result Value Ref Range   Sodium 140 135 - 146 mmol/L   Potassium 3.9 3.5 - 5.3 mmol/L   Chloride 103 98 - 110 mmol/L   CO2 28 20 - 31 mmol/L   Glucose, Bld 90 65 - 99 mg/dL   BUN 16 7 - 25 mg/dL   Creat 1.19 0.70 - 1.25 mg/dL   Total Bilirubin 0.5 0.2 - 1.2 mg/dL   Alkaline Phosphatase 50 40 - 115 U/L   AST 23 10 - 35 U/L   ALT 32 9 - 46 U/L   Total Protein 7.2 6.1 - 8.1 g/dL   Albumin 4.3 3.6 - 5.1 g/dL   Calcium 9.3 8.6 - 10.3 mg/dL   GFR, Est African American 75 >=60 mL/min   GFR, Est Non African American 65 >=60 mL/min      Assessment & Plan:   Problem List Items Addressed This Visit      Cardiovascular and Mediastinum   Benign essential HTN    Well-controlled today; as he continues to lose weight, should be able to decrease BP med        Musculoskeletal and Integument   OA (osteoarthritis) of knee    Worn out knee, due for replacement; will give medical clearance, surgical clearance per Dr. Clayborn Bigness       Relevant Orders   Ambulatory referral to Cardiology     Other  Obesity    So glad patient is working on better eating and being more active to lose weight      Abnormal EKG    Similar changes but a progressed since last EKG in 2010, with T wave inversion lateral and high lateral leads; had full work-up 7 years ago, but will refer back to [facility] for evaluation and cardiac clearance prior to his knee replacement      Relevant Orders   Ambulatory referral to Cardiology    Other Visit Diagnoses    Pre-op exam    -  Primary   evaluate prior to knee replacement; sending to cardiologist for cardiac clearance; want him to talk with anesthesiologist about prior experience, o/w cleared   Relevant Orders   EKG 12-Lead   Needs flu shot       Relevant Orders   Flu Vaccine QUAD 36+ mos PF IM (Fluarix & Fluzone Quad PF) (Completed)      Follow up plan: No Follow-up on file.  An after-visit summary was printed and given to the patient at [facility].  Please see the patient instructions which may contain other information and recommendations beyond what is mentioned above in the assessment and plan.  No orders of the defined types were placed in this encounter.   Orders Placed This Encounter  Procedures  . Flu Vaccine QUAD 36+ mos PF IM (Fluarix & Fluzone Quad PF)  . Ambulatory referral to Cardiology  . EKG 12-Lead

## 2016-08-07 NOTE — Assessment & Plan Note (Signed)
So glad patient is working on better eating and being more active to lose weight

## 2016-08-07 NOTE — Assessment & Plan Note (Signed)
Well-controlled today; as he continues to lose weight, should be able to decrease BP med

## 2016-08-09 ENCOUNTER — Encounter (HOSPITAL_COMMUNITY): Payer: Self-pay

## 2016-08-09 ENCOUNTER — Encounter (HOSPITAL_COMMUNITY)
Admission: RE | Admit: 2016-08-09 | Discharge: 2016-08-09 | Disposition: A | Discharge status: Home / Self Care | Payer: BLUE CROSS/BLUE SHIELD | Source: Ambulatory Visit | Attending: Orthopedic Surgery | Admitting: Orthopedic Surgery

## 2016-08-09 DIAGNOSIS — Z01812 Encounter for preprocedural laboratory examination: Secondary | ICD-10-CM | POA: Diagnosis present

## 2016-08-09 DIAGNOSIS — M1711 Unilateral primary osteoarthritis, right knee: Secondary | ICD-10-CM | POA: Insufficient documentation

## 2016-08-09 HISTORY — DX: Malignant hyperthermia due to anesthesia, initial encounter: T88.3XXA

## 2016-08-09 LAB — ABO/RH: ABO/RH(D): O POS

## 2016-08-09 LAB — BASIC METABOLIC PANEL
ANION GAP: 10 (ref 5–15)
BUN: 17 mg/dL (ref 6–20)
CALCIUM: 9.5 mg/dL (ref 8.9–10.3)
CO2: 26 mmol/L (ref 22–32)
Chloride: 103 mmol/L (ref 101–111)
Creatinine, Ser: 1.14 mg/dL (ref 0.61–1.24)
Glucose, Bld: 94 mg/dL (ref 65–99)
POTASSIUM: 3.6 mmol/L (ref 3.5–5.1)
SODIUM: 139 mmol/L (ref 135–145)

## 2016-08-09 LAB — CBC
HCT: 43.8 % (ref 39.0–52.0)
Hemoglobin: 15.3 g/dL (ref 13.0–17.0)
MCH: 30.3 pg (ref 26.0–34.0)
MCHC: 34.9 g/dL (ref 30.0–36.0)
MCV: 86.7 fL (ref 78.0–100.0)
PLATELETS: 274 10*3/uL (ref 150–400)
RBC: 5.05 MIL/uL (ref 4.22–5.81)
RDW: 12.3 % (ref 11.5–15.5)
WBC: 7.2 10*3/uL (ref 4.0–10.5)

## 2016-08-09 LAB — TYPE AND SCREEN
ABO/RH(D): O POS
ANTIBODY SCREEN: NEGATIVE

## 2016-08-09 LAB — SURGICAL PCR SCREEN
MRSA, PCR: NEGATIVE
STAPHYLOCOCCUS AUREUS: NEGATIVE

## 2016-08-09 NOTE — Pre-Procedure Instructions (Signed)
    Adir L [place]  08/09/2016      [id number] [place], [place] - [address] [place] [place] [address] Phone: [phone] Fax: [phone]  [facility] [place] - [address] [place] [id number] Phone: [phone] Fax: [phone]  [facility] [place] - [address] [place] [id number] Phone: [phone] Fax: [phone]    Your procedure is scheduled on 08/22/16.  [facility] at 11 [facility].M.  Call this number if you have problems the morning of surgery:  [phone]   Remember:  Do not eat food or drink liquids after midnight.  Take these medicines the morning of surgery with A SIP OF WATER --norvasc   Do not wear jewelry, make-up or nail polish.  Do not wear lotions, powders, or perfumes, or deoderant.  Do not shave 48 hours prior to surgery.  Men may shave face and neck.  Do not bring valuables to the hospital.  [facility] is not responsible for any belongings or valuables.  Contacts, dentures or bridgework may not be worn into surgery.  Leave your suitcase in the car.  After surgery it may be brought to your room.  For patients admitted to the hospital, discharge time will be determined by your treatment team.  Patients discharged the day of surgery will not be allowed to drive home.   Name and phone number of your driver:    Special instructions:  Do not take any aspirin,anti-inflammatories,vitamins,or herbal supplements 5-7 days prior to surgery.  Please read over the following fact sheets that you were given. MRSA Information

## 2016-08-10 ENCOUNTER — Other Ambulatory Visit (HOSPITAL_COMMUNITY): Payer: Self-pay

## 2016-08-10 NOTE — Progress Notes (Addendum)
Anesthesia Chart Review:  Pt is a 65 year old male scheduled for R total knee arthroplasty on 08/22/2016 with Mckinley Jewelaniel Murphy, MD.   - PCP is Baruch GoutyMelinda Lada, MD  PMH includes:  HTN, hyperlipidemia, OSA. Never smoker. BMI 38  Anesthesia history: Malignant hyperthermia ~1986  Medications include: amlodipine, ASA, losartan-hctz, cialis  Preoperative labs reviewed.    EKG 08/06/16: Sinus rhythm. RAD. RSR' (V1)-probable normal variant. Inferior/lateral T-wave changes may be due to myocardial ischemia  Pt saw Dorothyann Pengwayne Callwood, MD with cardiology on 08/06/16 for pre-op eval for surgery and for abnormal EKG.  Pt is scheduled for echo and stress test 08/17/16. Will revisit chart when results available.   Rica Mastngela Anamae Rochelle, FNP-BC [facility]/Anesthesiology Phone: [phone] 08/10/2016 4:16 PM  Addendum:   Nuclear stress test 08/17/16 (care everywhere):  Borderline myocardial perfusion scan. LV enlargement, mildly depressed overall LV function globally, no evidence of stress-induced myocardial ischemia, ejection fraction of 45%  Echo still pending. [facility].   Rica Mastngela Kirtan Sada, FNP-BC [facility]esthesiology Phone: [phone] 08/21/2016 4:39 PM

## 2016-08-13 ENCOUNTER — Encounter (HOSPITAL_COMMUNITY): Payer: Self-pay

## 2016-08-21 ENCOUNTER — Ambulatory Visit: Payer: BLUE CROSS/BLUE SHIELD | Admitting: Family Medicine

## 2016-08-21 MED ORDER — CEFAZOLIN SODIUM 10 G IJ SOLR
3.0000 g | INTRAMUSCULAR | Status: AC
  Administered 2016-08-22: 3 g via INTRAVENOUS
  Filled 2016-08-21: qty 3000

## 2016-08-21 MED ORDER — SODIUM CHLORIDE 0.9 % IV SOLN
1000.0000 mg | INTRAVENOUS | Status: AC
  Administered 2016-08-22: 1000 mg via INTRAVENOUS
  Filled 2016-08-21: qty 10

## 2016-08-21 MED ORDER — LACTATED RINGERS IV SOLN: INTRAVENOUS | Status: DC

## 2016-08-22 ENCOUNTER — Encounter (HOSPITAL_COMMUNITY)
Admission: RE | Disposition: A | Discharge status: Home Health | Payer: Self-pay | Source: Ambulatory Visit | Attending: Orthopedic Surgery

## 2016-08-22 ENCOUNTER — Inpatient Hospital Stay (HOSPITAL_COMMUNITY)
Admission: RE | Admit: 2016-08-22 | Discharge: 2016-08-23 | DRG: 470 | Disposition: A | Discharge status: Home Health | Payer: BLUE CROSS/BLUE SHIELD | Source: Ambulatory Visit | Attending: Orthopedic Surgery | Admitting: Orthopedic Surgery

## 2016-08-22 ENCOUNTER — Inpatient Hospital Stay (HOSPITAL_COMMUNITY): Payer: BLUE CROSS/BLUE SHIELD | Admitting: Emergency Medicine

## 2016-08-22 ENCOUNTER — Inpatient Hospital Stay (HOSPITAL_COMMUNITY): Payer: BLUE CROSS/BLUE SHIELD | Admitting: Anesthesiology

## 2016-08-22 ENCOUNTER — Inpatient Hospital Stay (HOSPITAL_COMMUNITY): Payer: BLUE CROSS/BLUE SHIELD

## 2016-08-22 DIAGNOSIS — Z8249 Family history of ischemic heart disease and other diseases of the circulatory system: Secondary | ICD-10-CM

## 2016-08-22 DIAGNOSIS — D62 Acute posthemorrhagic anemia: Secondary | ICD-10-CM | POA: Diagnosis not present

## 2016-08-22 DIAGNOSIS — M1711 Unilateral primary osteoarthritis, right knee: Secondary | ICD-10-CM | POA: Diagnosis present

## 2016-08-22 DIAGNOSIS — G4733 Obstructive sleep apnea (adult) (pediatric): Secondary | ICD-10-CM | POA: Diagnosis present

## 2016-08-22 DIAGNOSIS — I10 Essential (primary) hypertension: Secondary | ICD-10-CM | POA: Diagnosis present

## 2016-08-22 DIAGNOSIS — Z6839 Body mass index (BMI) 39.0-39.9, adult: Secondary | ICD-10-CM | POA: Diagnosis not present

## 2016-08-22 DIAGNOSIS — R262 Difficulty in walking, not elsewhere classified: Secondary | ICD-10-CM

## 2016-08-22 DIAGNOSIS — Z96659 Presence of unspecified artificial knee joint: Secondary | ICD-10-CM

## 2016-08-22 DIAGNOSIS — E669 Obesity, unspecified: Secondary | ICD-10-CM | POA: Diagnosis present

## 2016-08-22 DIAGNOSIS — M25661 Stiffness of right knee, not elsewhere classified: Secondary | ICD-10-CM

## 2016-08-22 DIAGNOSIS — M25561 Pain in right knee: Secondary | ICD-10-CM | POA: Diagnosis present

## 2016-08-22 DIAGNOSIS — G8918 Other acute postprocedural pain: Secondary | ICD-10-CM

## 2016-08-22 DIAGNOSIS — E7800 Pure hypercholesterolemia, unspecified: Secondary | ICD-10-CM | POA: Diagnosis present

## 2016-08-22 HISTORY — PX: TOTAL KNEE ARTHROPLASTY: SHX125

## 2016-08-22 SURGERY — ARTHROPLASTY, KNEE, TOTAL
Anesthesia: Spinal | Laterality: Right

## 2016-08-22 MED ORDER — AMLODIPINE BESYLATE 5 MG PO TABS
5.0000 mg | ORAL_TABLET | Freq: Every day | ORAL | Status: DC
  Administered 2016-08-23: 5 mg via ORAL
  Filled 2016-08-22: qty 1

## 2016-08-22 MED ORDER — ONDANSETRON HCL 4 MG PO TABS: 4.0000 mg | ORAL_TABLET | Freq: Four times a day (QID) | ORAL | Status: DC | PRN

## 2016-08-22 MED ORDER — MAGNESIUM CITRATE PO SOLN: 1.0000 | Freq: Once | ORAL | Status: DC | PRN

## 2016-08-22 MED ORDER — METHOCARBAMOL 500 MG PO TABS: 500.0000 mg | ORAL_TABLET | Freq: Four times a day (QID) | ORAL | 0 refills | Status: DC

## 2016-08-22 MED ORDER — ONDANSETRON HCL 4 MG/2ML IJ SOLN: 4.0000 mg | Freq: Four times a day (QID) | INTRAMUSCULAR | Status: DC | PRN

## 2016-08-22 MED ORDER — CHLORHEXIDINE GLUCONATE 4 % EX LIQD: 60.0000 mL | Freq: Once | CUTANEOUS | Status: DC

## 2016-08-22 MED ORDER — ACETAMINOPHEN 650 MG RE SUPP: 650.0000 mg | Freq: Four times a day (QID) | RECTAL | Status: DC | PRN

## 2016-08-22 MED ORDER — METOCLOPRAMIDE HCL 5 MG/ML IJ SOLN: 10.0000 mg | Freq: Once | INTRAMUSCULAR | Status: DC | PRN

## 2016-08-22 MED ORDER — OXYCODONE HCL 5 MG PO TABS
5.0000 mg | ORAL_TABLET | ORAL | Status: DC | PRN
  Administered 2016-08-22 – 2016-08-23 (×3): 10 mg via ORAL
  Filled 2016-08-22 (×3): qty 2

## 2016-08-22 MED ORDER — SODIUM CHLORIDE 0.9 % IR SOLN
Status: DC | PRN
  Administered 2016-08-22: 1000 mL
  Administered 2016-08-22: 3000 mL

## 2016-08-22 MED ORDER — ZOLPIDEM TARTRATE 5 MG PO TABS
5.0000 mg | ORAL_TABLET | Freq: Every evening | ORAL | Status: DC | PRN
  Administered 2016-08-23: 5 mg via ORAL
  Filled 2016-08-22: qty 1

## 2016-08-22 MED ORDER — BISACODYL 10 MG RE SUPP: 10.0000 mg | Freq: Every day | RECTAL | Status: DC | PRN

## 2016-08-22 MED ORDER — MENTHOL 3 MG MT LOZG: 1.0000 | LOZENGE | OROMUCOSAL | Status: DC | PRN

## 2016-08-22 MED ORDER — DIPHENHYDRAMINE HCL 12.5 MG/5ML PO ELIX: 12.5000 mg | ORAL_SOLUTION | ORAL | Status: DC | PRN

## 2016-08-22 MED ORDER — METHOCARBAMOL 1000 MG/10ML IJ SOLN
500.0000 mg | Freq: Four times a day (QID) | INTRAVENOUS | Status: DC | PRN
  Filled 2016-08-22: qty 5

## 2016-08-22 MED ORDER — CELECOXIB 200 MG PO CAPS
200.0000 mg | ORAL_CAPSULE | Freq: Two times a day (BID) | ORAL | Status: DC
  Administered 2016-08-22 – 2016-08-23 (×2): 200 mg via ORAL
  Filled 2016-08-22 (×2): qty 1

## 2016-08-22 MED ORDER — CEFAZOLIN SODIUM-DEXTROSE 2-4 GM/100ML-% IV SOLN
2.0000 g | Freq: Four times a day (QID) | INTRAVENOUS | Status: AC
  Administered 2016-08-22 – 2016-08-23 (×2): 2 g via INTRAVENOUS
  Filled 2016-08-22 (×2): qty 100

## 2016-08-22 MED ORDER — PROPOFOL 10 MG/ML IV BOLUS
INTRAVENOUS | Status: AC
  Filled 2016-08-22: qty 20

## 2016-08-22 MED ORDER — LOSARTAN POTASSIUM-HCTZ 100-25 MG PO TABS: 1.0000 | ORAL_TABLET | Freq: Every day | ORAL | Status: DC

## 2016-08-22 MED ORDER — MEPERIDINE HCL 25 MG/ML IJ SOLN: 6.2500 mg | INTRAMUSCULAR | Status: DC | PRN

## 2016-08-22 MED ORDER — DOCUSATE SODIUM 100 MG PO CAPS
100.0000 mg | ORAL_CAPSULE | Freq: Two times a day (BID) | ORAL | Status: DC
  Administered 2016-08-22 – 2016-08-23 (×2): 100 mg via ORAL
  Filled 2016-08-22 (×2): qty 1

## 2016-08-22 MED ORDER — MIDAZOLAM HCL 5 MG/5ML IJ SOLN
INTRAMUSCULAR | Status: DC | PRN
  Administered 2016-08-22: 2 mg via INTRAVENOUS

## 2016-08-22 MED ORDER — PHENYLEPHRINE HCL 10 MG/ML IJ SOLN
INTRAVENOUS | Status: DC | PRN
  Administered 2016-08-22: 25 ug/min via INTRAVENOUS

## 2016-08-22 MED ORDER — ONDANSETRON HCL 4 MG PO TABS: 4.0000 mg | ORAL_TABLET | Freq: Three times a day (TID) | ORAL | 0 refills | Status: DC | PRN

## 2016-08-22 MED ORDER — HYDROCHLOROTHIAZIDE 25 MG PO TABS
25.0000 mg | ORAL_TABLET | Freq: Every day | ORAL | Status: DC
  Administered 2016-08-22 – 2016-08-23 (×2): 25 mg via ORAL
  Filled 2016-08-22 (×2): qty 1

## 2016-08-22 MED ORDER — FENTANYL CITRATE (PF) 100 MCG/2ML IJ SOLN
INTRAMUSCULAR | Status: DC | PRN
  Administered 2016-08-22: 100 ug via INTRAVENOUS

## 2016-08-22 MED ORDER — ASPIRIN EC 325 MG PO TBEC: 325.0000 mg | DELAYED_RELEASE_TABLET | Freq: Every day | ORAL | 0 refills | Status: DC

## 2016-08-22 MED ORDER — LOSARTAN POTASSIUM 50 MG PO TABS
100.0000 mg | ORAL_TABLET | Freq: Every day | ORAL | Status: DC
  Administered 2016-08-22 – 2016-08-23 (×2): 100 mg via ORAL
  Filled 2016-08-22 (×2): qty 2

## 2016-08-22 MED ORDER — DEXAMETHASONE SODIUM PHOSPHATE 10 MG/ML IJ SOLN
10.0000 mg | Freq: Once | INTRAMUSCULAR | Status: AC
  Administered 2016-08-23: 10 mg via INTRAVENOUS
  Filled 2016-08-22: qty 1

## 2016-08-22 MED ORDER — LACTATED RINGERS IV SOLN: INTRAVENOUS | Status: DC

## 2016-08-22 MED ORDER — OXYCODONE-ACETAMINOPHEN 5-325 MG PO TABS: 1.0000 | ORAL_TABLET | ORAL | 0 refills | Status: DC | PRN

## 2016-08-22 MED ORDER — FENTANYL CITRATE (PF) 100 MCG/2ML IJ SOLN
INTRAMUSCULAR | Status: AC
  Filled 2016-08-22: qty 2

## 2016-08-22 MED ORDER — FENTANYL CITRATE (PF) 100 MCG/2ML IJ SOLN: 25.0000 ug | INTRAMUSCULAR | Status: DC | PRN

## 2016-08-22 MED ORDER — ONDANSETRON HCL 4 MG/2ML IJ SOLN
INTRAMUSCULAR | Status: AC
  Filled 2016-08-22: qty 6

## 2016-08-22 MED ORDER — BUPIVACAINE LIPOSOME 1.3 % IJ SUSP
20.0000 mL | INTRAMUSCULAR | Status: AC
  Administered 2016-08-22: 20 mL
  Filled 2016-08-22: qty 20

## 2016-08-22 MED ORDER — POTASSIUM CHLORIDE IN NACL 20-0.9 MEQ/L-% IV SOLN
INTRAVENOUS | Status: DC
  Administered 2016-08-22: 16:00:00 via INTRAVENOUS
  Filled 2016-08-22 (×2): qty 1000

## 2016-08-22 MED ORDER — POLYETHYLENE GLYCOL 3350 17 G PO PACK: 17.0000 g | PACK | Freq: Every day | ORAL | Status: DC | PRN

## 2016-08-22 MED ORDER — MIDAZOLAM HCL 2 MG/2ML IJ SOLN
INTRAMUSCULAR | Status: AC
  Filled 2016-08-22: qty 2

## 2016-08-22 MED ORDER — SODIUM CHLORIDE 0.9 % IJ SOLN
INTRAMUSCULAR | Status: DC | PRN
  Administered 2016-08-22: 40 mL via INTRAVENOUS

## 2016-08-22 MED ORDER — PHENOL 1.4 % MT LIQD: 1.0000 | OROMUCOSAL | Status: DC | PRN

## 2016-08-22 MED ORDER — ASPIRIN EC 325 MG PO TBEC
325.0000 mg | DELAYED_RELEASE_TABLET | Freq: Every day | ORAL | Status: DC
  Administered 2016-08-23: 325 mg via ORAL
  Filled 2016-08-22: qty 1

## 2016-08-22 MED ORDER — ALUM & MAG HYDROXIDE-SIMETH 200-200-20 MG/5ML PO SUSP: 30.0000 mL | ORAL | Status: DC | PRN

## 2016-08-22 MED ORDER — PROPOFOL 500 MG/50ML IV EMUL
INTRAVENOUS | Status: DC | PRN
  Administered 2016-08-22: 55 ug/kg/min via INTRAVENOUS

## 2016-08-22 MED ORDER — LIDOCAINE HCL (CARDIAC) 20 MG/ML IV SOLN
INTRAVENOUS | Status: DC | PRN
  Administered 2016-08-22: 30 mg via INTRAVENOUS

## 2016-08-22 MED ORDER — METOCLOPRAMIDE HCL 5 MG PO TABS: 5.0000 mg | ORAL_TABLET | Freq: Three times a day (TID) | ORAL | Status: DC | PRN

## 2016-08-22 MED ORDER — BUPIVACAINE HCL 0.5 % IJ SOLN
INTRAMUSCULAR | Status: DC | PRN
  Administered 2016-08-22: 10 mL

## 2016-08-22 MED ORDER — ACETAMINOPHEN 325 MG PO TABS: 650.0000 mg | ORAL_TABLET | Freq: Four times a day (QID) | ORAL | Status: DC | PRN

## 2016-08-22 MED ORDER — BUPIVACAINE IN DEXTROSE 0.75-8.25 % IT SOLN
INTRATHECAL | Status: DC | PRN
  Administered 2016-08-22: 1.6 mL via INTRATHECAL

## 2016-08-22 MED ORDER — LACTATED RINGERS IV SOLN
INTRAVENOUS | Status: DC
  Administered 2016-08-22 (×3): via INTRAVENOUS

## 2016-08-22 MED ORDER — METOCLOPRAMIDE HCL 5 MG/ML IJ SOLN: 5.0000 mg | Freq: Three times a day (TID) | INTRAMUSCULAR | Status: DC | PRN

## 2016-08-22 MED ORDER — METHOCARBAMOL 500 MG PO TABS
500.0000 mg | ORAL_TABLET | Freq: Four times a day (QID) | ORAL | Status: DC | PRN
  Administered 2016-08-23: 500 mg via ORAL
  Filled 2016-08-22: qty 1

## 2016-08-22 MED ORDER — HYDROMORPHONE HCL 2 MG/ML IJ SOLN: 0.5000 mg | INTRAMUSCULAR | Status: DC | PRN

## 2016-08-22 MED ORDER — PROPOFOL 10 MG/ML IV BOLUS
INTRAVENOUS | Status: DC | PRN
  Administered 2016-08-22: 50 mg via INTRAVENOUS

## 2016-08-22 SURGICAL SUPPLY — 65 items
BANDAGE ACE 4X5 VEL STRL LF (GAUZE/BANDAGES/DRESSINGS) ×3 IMPLANT
BANDAGE ACE 6X5 VEL STRL LF (GAUZE/BANDAGES/DRESSINGS) ×3 IMPLANT
BANDAGE ELASTIC 6 VELCRO ST LF (GAUZE/BANDAGES/DRESSINGS) ×3 IMPLANT
BANDAGE ESMARK 6X9 LF (GAUZE/BANDAGES/DRESSINGS) ×1 IMPLANT
BENZOIN TINCTURE PRP APPL 2/3 (GAUZE/BANDAGES/DRESSINGS) ×3 IMPLANT
BLADE SAG 18X100X1.27 (BLADE) ×6 IMPLANT
BLADE SURG 10 STRL SS (BLADE) ×3 IMPLANT
BNDG ESMARK 6X9 LF (GAUZE/BANDAGES/DRESSINGS) ×3
BOWL SMART MIX CTS (DISPOSABLE) ×3 IMPLANT
CAPT KNEE TOTAL 3 ×3 IMPLANT
CEMENT BONE SIMPLEX SPEEDSET (Cement) ×6 IMPLANT
CLOSURE STERI-STRIP 1/2X4 (GAUZE/BANDAGES/DRESSINGS) ×1
CLOSURE WOUND 1/2 X4 (GAUZE/BANDAGES/DRESSINGS) ×2
CLSR STERI-STRIP ANTIMIC 1/2X4 (GAUZE/BANDAGES/DRESSINGS) ×2 IMPLANT
COVER SURGICAL LIGHT HANDLE (MISCELLANEOUS) ×3 IMPLANT
CUFF TOURNIQUET SINGLE 34IN LL (TOURNIQUET CUFF) ×3 IMPLANT
DRAPE HALF SHEET 40X57 (DRAPES) ×3 IMPLANT
DRAPE IMP U-DRAPE 54X76 (DRAPES) ×3 IMPLANT
DRAPE PROXIMA HALF (DRAPES) ×3 IMPLANT
DRAPE U-SHAPE 47X51 STRL (DRAPES) ×3 IMPLANT
DRSG AQUACEL AG ADV 3.5X10 (GAUZE/BANDAGES/DRESSINGS) ×3 IMPLANT
DURAPREP 26ML APPLICATOR (WOUND CARE) ×6 IMPLANT
ELECT CAUTERY BLADE 6.4 (BLADE) ×3 IMPLANT
ELECT REM PT RETURN 9FT ADLT (ELECTROSURGICAL) ×3
ELECTRODE REM PT RTRN 9FT ADLT (ELECTROSURGICAL) ×1 IMPLANT
EVACUATOR 1/8 PVC DRAIN (DRAIN) IMPLANT
FACESHIELD WRAPAROUND (MASK) ×6 IMPLANT
GLOVE BIOGEL PI IND STRL 7.0 (GLOVE) ×2 IMPLANT
GLOVE BIOGEL PI INDICATOR 7.0 (GLOVE) ×4
GLOVE ORTHO TXT STRL SZ7.5 (GLOVE) ×3 IMPLANT
GLOVE SURG ORTHO 7.0 STRL STRW (GLOVE) ×6 IMPLANT
GLOVE SURG SS PI 6.5 STRL IVOR (GLOVE) ×3 IMPLANT
GLOVE SURG SS PI 8.0 STRL IVOR (GLOVE) ×3 IMPLANT
GOWN STRL REUS W/ TWL LRG LVL3 (GOWN DISPOSABLE) ×2 IMPLANT
GOWN STRL REUS W/ TWL XL LVL3 (GOWN DISPOSABLE) ×1 IMPLANT
GOWN STRL REUS W/TWL LRG LVL3 (GOWN DISPOSABLE) ×4
GOWN STRL REUS W/TWL XL LVL3 (GOWN DISPOSABLE) ×2
HANDPIECE INTERPULSE COAX TIP (DISPOSABLE) ×2
IMMOBILIZER KNEE 22 UNIV (SOFTGOODS) ×3 IMPLANT
IMMOBILIZER KNEE 24 THIGH 36 (MISCELLANEOUS) IMPLANT
IMMOBILIZER KNEE 24 UNIV (MISCELLANEOUS)
KIT BASIN OR (CUSTOM PROCEDURE TRAY) ×3 IMPLANT
KIT ROOM TURNOVER OR (KITS) ×3 IMPLANT
MANIFOLD NEPTUNE II (INSTRUMENTS) ×3 IMPLANT
NEEDLE 18GX1X1/2 (RX/OR ONLY) (NEEDLE) ×3 IMPLANT
NEEDLE HYPO 25GX1X1/2 BEV (NEEDLE) ×3 IMPLANT
NS IRRIG 1000ML POUR BTL (IV SOLUTION) ×3 IMPLANT
PACK TOTAL JOINT (CUSTOM PROCEDURE TRAY) ×3 IMPLANT
PAD ARMBOARD 7.5X6 YLW CONV (MISCELLANEOUS) ×3 IMPLANT
SET HNDPC FAN SPRY TIP SCT (DISPOSABLE) ×1 IMPLANT
STRIP CLOSURE SKIN 1/2X4 (GAUZE/BANDAGES/DRESSINGS) ×4 IMPLANT
SUCTION FRAZIER HANDLE 10FR (MISCELLANEOUS) ×2
SUCTION TUBE FRAZIER 10FR DISP (MISCELLANEOUS) ×1 IMPLANT
SUT MNCRL AB 4-0 PS2 18 (SUTURE) ×3 IMPLANT
SUT VIC AB 0 CT1 27 (SUTURE)
SUT VIC AB 0 CT1 27XBRD ANBCTR (SUTURE) IMPLANT
SUT VIC AB 1 CTX 36 (SUTURE) ×2
SUT VIC AB 1 CTX36XBRD ANBCTR (SUTURE) ×1 IMPLANT
SUT VIC AB 2-0 CT1 27 (SUTURE) ×4
SUT VIC AB 2-0 CT1 TAPERPNT 27 (SUTURE) ×2 IMPLANT
SYR 50ML LL SCALE MARK (SYRINGE) ×3 IMPLANT
SYR CONTROL 10ML LL (SYRINGE) ×3 IMPLANT
TOWEL OR 17X24 6PK STRL BLUE (TOWEL DISPOSABLE) ×3 IMPLANT
TOWEL OR 17X26 10 PK STRL BLUE (TOWEL DISPOSABLE) ×3 IMPLANT
TRAY CATH 16FR W/PLASTIC CATH (SET/KITS/TRAYS/PACK) ×3 IMPLANT

## 2016-08-22 NOTE — Progress Notes (Signed)
Orthopedic Tech Progress Note Patient Details:  Warren [name] [place] 05/07/1951 [id number]  CPM Right Knee CPM Right Knee: On Right Knee Flexion (Degrees): 90 Right Knee Extension (Degrees): 0 Additional Comments: trapeze bar patient helper   Nikki DomCrawford, Myanna Ziesmer 08/22/2016, 2:30 PM Viewed order from doctor's order list

## 2016-08-22 NOTE — Discharge Summary (Addendum)
Patient ID: Warren [name] MRN: [id number] DOB/AGE: March 16, 1951 65 y.o.  Admit date: 08/22/2016 Discharge date: 08/23/2016  Admission Diagnoses:  Active Problems:   Primary localized osteoarthritis of right knee   Discharge Diagnoses:  Same  Past Medical History:  Diagnosis Date  . Erectile dysfunction 02/27/2016  . Hypercholesterolemia without hypertriglyceridemia 10/08/2007  . Hypertension   . [facility]  . OA (osteoarthritis) of knee 05/15/2016   Managed by ortho  . Obesity 02/24/2015  . Obstructive sleep apnea of adult 02/24/2015    Surgeries: Procedure(s): TOTAL KNEE ARTHROPLASTY on 08/22/2016   Consultants:   Discharged Condition: Improved  Hospital Course: Warren [name] is an 65 y.o. male who was admitted 08/22/2016 for operative treatment of primary localized osteoarthritis right knee. Patient has severe unremitting pain that affects sleep, daily activities, and work/hobbies. After pre-op clearance the patient was taken to the operating room on 08/22/2016 and underwent  Procedure(s): TOTAL KNEE ARTHROPLASTY.    Patient was given perioperative antibiotics:  Anti-infectives    Start     Dose/Rate Route Frequency Ordered Stop   08/22/16 1800  ceFAZolin (ANCEF) IVPB 2g/100 mL premix     2 g 200 mL/hr over 30 Minutes Intravenous Every 6 hours 08/22/16 1535 08/23/16 0041   08/22/16 1130  ceFAZolin (ANCEF) 3 g in dextrose 5 % 50 mL IVPB     3 g 130 mL/hr over 30 Minutes Intravenous To [facility] 08/21/16 0724 08/22/16 1207       Patient was given sequential compression devices, early ambulation, and chemoprophylaxis to prevent DVT.  Patient benefited maximally from hospital stay and there were no complications.    Recent vital signs:  Patient Vitals for the past 24 hrs:  BP Temp Temp src Pulse Resp SpO2 Height Weight  08/23/16 0420 (!) 145/78 98 F (36.7 C) Oral (!) 56 16 98 % - -  08/23/16 0000 134/88 97.7 F (36.5 C) Oral (!)  58 16 96 % - -  08/22/16 2215 - - - (!) 55 16 100 % - -  08/22/16 2000 (!) 158/85 97.6 F (36.4 C) Oral 67 16 100 % - -  08/22/16 1602 (!) 145/86 97.5 F (36.4 C) Oral (!) 49 16 99 % - -  08/22/16 1520 - 98.1 F (36.7 C) - (!) 49 15 99 % - -  08/22/16 1518 (!) 145/82 - - - - - - -  08/22/16 1515 - - - (!) 49 19 98 % - -  08/22/16 1503 126/81 - - - - - - -  08/22/16 1500 - - - (!) 47 16 98 % - -  08/22/16 1448 113/73 - - - - - - -  08/22/16 1445 - - - (!) 48 10 97 % - -  08/22/16 1430 - - - (!) 51 16 97 % - -  08/22/16 1415 97/77 - - (!) 57 16 97 % - -  08/22/16 1400 (!) 99/50 97.4 F (36.3 C) - 64 15 91 % - -  08/22/16 1028 (!) 160/88 98.4 F (36.9 C) Oral (!) 55 18 100 % 6' (1.829 m) 126.7 kg (279 lb 6 oz)     Recent laboratory studies:   Recent Labs  08/23/16 0511  WBC 11.0*  HGB 13.8  HCT 42.1  PLT 246  NA 140  K 3.8  CL 102  CO2 30  BUN 12  CREATININE 1.04  GLUCOSE 90  CALCIUM 8.8*  Discharge Medications:     Medication List    STOP taking these medications   FISH OIL DOUBLE STRENGTH 1200 MG Caps     TAKE these medications   amLODipine 5 MG tablet Commonly known as:  NORVASC Take 1 tablet (5 mg total) by mouth daily.   aspirin EC 325 MG tablet Take 1 tablet (325 mg total) by mouth daily. 1 tab a day for the next 30 days to prevent blood clots What changed:  medication strength  how much to take  additional instructions   losartan-hydrochlorothiazide 100-25 MG tablet Commonly known as:  HYZAAR Take 1 tablet by mouth daily.   methocarbamol 500 MG tablet Commonly known as:  ROBAXIN Take 1 tablet (500 mg total) by mouth 4 (four) times daily.   ondansetron 4 MG tablet Commonly known as:  ZOFRAN Take 1 tablet (4 mg total) by mouth every 8 (eight) hours as needed for nausea or vomiting.   oxyCODONE-acetaminophen 5-325 MG tablet Commonly known as:  PERCOCET Take 1-2 tablets by mouth every 4 (four) hours as needed for severe pain.    tadalafil 20 MG tablet Commonly known as:  CIALIS One by mouth every two days if needed prior for intimacy (only for intermittent use) What changed:  how much to take  how to take this  when to take this  reasons to take this  additional instructions       Diagnostic Studies: Dg Knee Right Port  Result Date: 08/22/2016 CLINICAL DATA:  Status post total knee arthroplasty. EXAM: PORTABLE RIGHT KNEE - 1-2 VIEW COMPARISON:  None FINDINGS: The hardware components of a right total knee arthroplasty device are identified. There is no periprosthetic fracture or subluxation. Gas is identified within the joint space and surrounding soft tissues. IMPRESSION: 1. Status post right total knee arthroplasty. Electronically Signed   By: Signa Kellaylor  Stroud M.D.   On: 08/22/2016 14:52    Disposition: Final discharge disposition not confirmed    Follow-up Information    Loreta Aveaniel F Murphy, MD. Schedule an appointment as soon as possible for a visit in 2 weeks.   Specialty:  Orthopedic Surgery Contact information: [address] [place] [address] [phone]            Signed: Otilio SaberM Lindsey Shariya Gaster 08/23/2016, 7:03 AM

## 2016-08-22 NOTE — Progress Notes (Signed)
Report given to david rees rn as caregiver 

## 2016-08-22 NOTE — Discharge Instructions (Signed)

## 2016-08-22 NOTE — Interval H&P Note (Signed)
History and Physical Interval Note:  08/22/2016 10:03 AM  Warren [name]  has presented today for surgery, with the diagnosis of djd right knee  The various methods of treatment have been discussed with the patient and family. After consideration of risks, benefits and other options for treatment, the patient has consented to  Procedure(s): TOTAL KNEE ARTHROPLASTY (Right) as a surgical intervention .  The patient's history has been reviewed, patient examined, no change in status, stable for surgery.  I have reviewed the patient's chart and labs.  Questions were answered to the patient's satisfaction.     Loreta Aveaniel F Magdalynn Davilla

## 2016-08-22 NOTE — Anesthesia Procedure Notes (Signed)
Spinal  Patient location during procedure: OR Staffing Anesthesiologist: Keisi Eckford Performed: anesthesiologist  Preanesthetic Checklist Completed: patient identified, site marked, surgical consent, pre-op evaluation, timeout performed, IV checked, risks and benefits discussed and monitors and equipment checked Spinal Block Patient position: sitting Prep: ChloraPrep Patient monitoring: heart rate, continuous pulse ox and blood pressure Approach: right paramedian Location: L4-5 Injection technique: single-shot Needle Needle type: Sprotte  Needle gauge: 24 G Needle length: 9 cm Additional Notes Expiration date of kit checked and confirmed. Patient tolerated procedure well, without complications.       

## 2016-08-22 NOTE — Anesthesia Postprocedure Evaluation (Signed)
Anesthesia Post Note  Patient: Warren [name]  Procedure(s) Performed: Procedure(s) (LRB): TOTAL KNEE ARTHROPLASTY (Right)  Patient location during evaluation: PACU Anesthesia Type: Spinal Level of consciousness: awake and alert Pain management: pain level controlled Vital Signs Assessment: post-procedure vital signs reviewed and stable Respiratory status: spontaneous breathing and respiratory function stable Cardiovascular status: blood pressure returned to baseline and stable Postop Assessment: no headache, no backache and spinal receding Anesthetic complications: no    Last Vitals:  Vitals:   08/22/16 1415 08/22/16 1430  BP: 97/77   Pulse: (!) 57   Resp: 16 16  Temp:      Last Pain:  Vitals:   08/22/16 1400  TempSrc:   PainSc: 0-No pain                 Phillips Groutarignan, Prerana Strayer

## 2016-08-22 NOTE — Transfer of Care (Signed)
Immediate Anesthesia Transfer of Care Note  Patient: Warren [name]  Procedure(s) Performed: Procedure(s): TOTAL KNEE ARTHROPLASTY (Right)  Patient Location: PACU  Anesthesia Type:Spinal  Level of Consciousness: awake, alert  and oriented  Airway & Oxygen Therapy: Patient Spontanous Breathing  Post-op Assessment: Report given to RN, Post -op Vital signs reviewed and stable and Patient moving all extremities X 4  Post vital signs: Reviewed and stable  Last Vitals:  Vitals:   08/22/16 1028  BP: (!) 160/88  Pulse: (!) 55  Resp: 18  Temp: 36.9 C    Last Pain:  Vitals:   08/22/16 1028  TempSrc: Oral         Complications: No apparent anesthesia complications

## 2016-08-22 NOTE — Anesthesia Preprocedure Evaluation (Signed)
Anesthesia Evaluation  Patient identified by MRN, date of birth, ID band Patient awake    Reviewed: Allergy & Precautions, NPO status , Patient's Chart, lab work & pertinent test results  History of Anesthesia Complications (+) MALIGNANT HYPERTHERMIA and history of anesthetic complications (Muscle biopsy tested positive in 1980's at [facility].)  Airway Mallampati: II  TM Distance: >3 FB Neck ROM: Full    Dental no notable dental hx.    Pulmonary neg pulmonary ROS, sleep apnea ,    Pulmonary exam normal breath sounds clear to auscultation       Cardiovascular hypertension, Pt. on medications negative cardio ROS Normal cardiovascular exam Rhythm:Regular Rate:Normal     Neuro/Psych negative neurological ROS  negative psych ROS   GI/Hepatic negative GI ROS, Neg liver ROS,   Endo/Other  negative endocrine ROS  Renal/GU negative Renal ROS  negative genitourinary   Musculoskeletal negative musculoskeletal ROS (+)   Abdominal   Peds negative pediatric ROS (+)  Hematology negative hematology ROS (+)   Anesthesia Other Findings   Reproductive/Obstetrics negative OB ROS                             Anesthesia Physical Anesthesia Plan  ASA: II  Anesthesia Plan: Spinal   Post-op Pain Management:    Induction:   Airway Management Planned: Simple Face Mask  Additional Equipment:   Intra-op Plan:   Post-operative Plan:   Informed Consent: I have reviewed the patients History and Physical, chart, labs and discussed the procedure including the risks, benefits and alternatives for the proposed anesthesia with the patient or authorized representative who has indicated his/her understanding and acceptance.   Dental advisory given  Plan Discussed with: CRNA  Anesthesia Plan Comments: (Legitimate documented history of Malignant hyperthermia. Non triggering anesthetics only.  SAB today )         Anesthesia Quick Evaluation

## 2016-08-22 NOTE — Evaluation (Signed)
Physical Therapy Evaluation Patient Details Name: Warren [name] [place] MRN: [id number] DOB: 03-01-1951 Today's Date: 08/22/2016   History of Present Illness  65 y.o. [facility] for elective R TKA.  Pt with significant PMhx of obesity, HTN, and bil foot surgery.    Clinical Impression  Pt is POD #0 and is moving well.  He did have some lightheadedness and nausea and was incontinent of urine in the bed (his sensation to his genital area had not quite returned yet).  He mobilized OOB to chair and then back to bed (due to in and out cath) with min assist overall.  He does not have help at home during the day and may need SNF level rehab if he does not progress well enough to be mod I at discharge.   PT to follow acutely for deficits listed below.       Follow Up Recommendations Home health PT;Supervision - Intermittent    Equipment Recommendations  Rolling walker with 5" wheels;3in1 (PT)    Recommendations for Other Services   NA    Precautions / Restrictions Precautions Precautions: Knee Precaution Booklet Issued: Yes (comment) Precaution Comments: knee exercise handout given Restrictions Weight Bearing Restrictions: Yes RLE Weight Bearing: Weight bearing as tolerated      Mobility  Bed Mobility Overal bed mobility: Modified Independent             General bed mobility comments: HOB elevated and pt using railing to get EOB.  Pt able to get back into flat bed without railing and was able to lift his leg against gravity.   Transfers Overall transfer level: Needs assistance   Transfers: Sit to/from Stand Sit to Stand: Min assist;From elevated surface         General transfer comment: Min assist from elevated bed (due to pt's tall stature) and lower recliner chair.  Min assist for balance during transitions.  Verbal cues for safe hand placement and RW use.  Stood multiple times to attempt to fully empty his bladder.   Ambulation/Gait Ambulation/Gait  assistance: Min assist Ambulation Distance (Feet): 8 Feet Assistive device: Rolling walker (2 wheeled) Gait Pattern/deviations: Step-to pattern;Antalgic     General Gait Details: Verbal cues for safe RW use and correct LE sequencing. Pt needed min assist at trunk for balance.       Balance Overall balance assessment: Needs assistance Sitting-balance support: Feet supported;No upper extremity supported Sitting balance-Leahy Scale: Good     Standing balance support: Bilateral upper extremity supported;No upper extremity supported;Single extremity supported Standing balance-Leahy Scale: Poor Standing balance comment: External assist needed in standing.                             Pertinent Vitals/Pain Pain Assessment: Faces Faces Pain Scale: Hurts little more Pain Location: right knee, reports more bladder pressure and nausea than knee pain Pain Descriptors / Indicators: Guarding;Grimacing Pain Intervention(s): Limited activity within patient's tolerance;Monitored during session;Repositioned    Home Living Family/patient expects to be discharged to:: Private residence Living Arrangements: Spouse/significant other Available Help at Discharge: Family;Available PRN/intermittently (wife works full time, day shift) Type of Home: House Home Access: Level entry;Other (comment) (1 small step up into the kitchen)     Home Layout: One level Home Equipment: Crutches      Prior Function Level of Independence: Independent         Comments: pt works a physical job  Extremity/Trunk Assessment   Upper Extremity Assessment Upper Extremity Assessment: Defer to OT evaluation    Lower Extremity Assessment Lower Extremity Assessment: RLE deficits/detail RLE Deficits / Details: right leg with normal post op pain and weakness.  Ankle at least 4/5, knee 3-/5, hip flexion 3/5    Cervical / Trunk Assessment Cervical / Trunk Assessment: Normal  Communication    Communication: No difficulties  Cognition Arousal/Alertness: Awake/alert Behavior During Therapy: WFL for tasks assessed/performed Overall Cognitive Status: Within Functional Limits for tasks assessed                      General Comments General comments (skin integrity, edema, etc.): Pt was lightheaded in standing and nauseated.  He never vomited.  His BPs when taken once in recliner chair were soft 11/56, 108/66 5 mins apart.  O2 sats and HR were stable.     Exercises Total Joint Exercises Ankle Circles/Pumps: AROM;Both;20 reps   Assessment/Plan    PT Assessment Patient needs continued PT services  PT Problem List Decreased strength;Decreased range of motion;Decreased activity tolerance;Decreased mobility;Decreased balance;Decreased knowledge of use of DME;Decreased knowledge of precautions;Pain          PT Treatment Interventions DME instruction;Gait training;Stair training;Functional mobility training;Therapeutic activities;Balance training;Therapeutic exercise;Patient/family education;Manual techniques;Modalities    PT Goals (Current goals can be found in the Care Plan section)  Acute Rehab PT Goals Patient Stated Goal: to see how he does before deciding if he needs to go to rehab somewhere.  PT Goal Formulation: With patient Time For Goal Achievement: 08/29/16 Potential to Achieve Goals: Good    Frequency 7X/week   Barriers to discharge Decreased caregiver support pt's wife works during the day and he will be home alone for at least 8 hours.        End of Session   Activity Tolerance: Patient limited by fatigue;Other (comment) (by nausea and lightheadedness) Patient left: in bed;with call bell/phone within reach;Other (comment) (due to RN tech to in and out cath his bladder) Nurse Communication: Mobility status         Time: 1721-1801 PT Time Calculation (min) (ACUTE ONLY): 40 min   Charges:   PT Evaluation $PT Eval Moderate Complexity: 1  Procedure PT Treatments $Therapeutic Activity: 23-37 mins        Cashmere Dingley B. Malakie Balis, PT, DPT [phone]   08/22/2016, 6:16 PM

## 2016-08-22 NOTE — Anesthesia Procedure Notes (Signed)
Procedure Name: MAC Date/Time: 08/22/2016 11:59 AM Performed by: Carmela RimaMARTINELLI, Warren [name] Pre-anesthesia Checklist: Timeout performed, Patient being monitored, Suction available, Emergency Drugs available and Patient identified Oxygen Delivery Method: Simple face mask Placement Confirmation: positive ETCO2 Dental Injury: Teeth and Oropharynx as per pre-operative assessment

## 2016-08-23 ENCOUNTER — Encounter (HOSPITAL_COMMUNITY): Payer: Self-pay | Admitting: General Practice

## 2016-08-23 LAB — BASIC METABOLIC PANEL
Anion gap: 8 (ref 5–15)
BUN: 12 mg/dL (ref 6–20)
CHLORIDE: 102 mmol/L (ref 101–111)
CO2: 30 mmol/L (ref 22–32)
Calcium: 8.8 mg/dL — ABNORMAL LOW (ref 8.9–10.3)
Creatinine, Ser: 1.04 mg/dL (ref 0.61–1.24)
Glucose, Bld: 90 mg/dL (ref 65–99)
POTASSIUM: 3.8 mmol/L (ref 3.5–5.1)
SODIUM: 140 mmol/L (ref 135–145)

## 2016-08-23 LAB — CBC
HCT: 42.1 % (ref 39.0–52.0)
HEMOGLOBIN: 13.8 g/dL (ref 13.0–17.0)
MCH: 29.8 pg (ref 26.0–34.0)
MCHC: 32.8 g/dL (ref 30.0–36.0)
MCV: 90.9 fL (ref 78.0–100.0)
PLATELETS: 246 10*3/uL (ref 150–400)
RBC: 4.63 MIL/uL (ref 4.22–5.81)
RDW: 13 % (ref 11.5–15.5)
WBC: 11 10*3/uL — AB (ref 4.0–10.5)

## 2016-08-23 NOTE — Progress Notes (Signed)
Discharge home. Home discharge instruction given by previous RN. Patient denies any other need or education .

## 2016-08-23 NOTE — Care Management Note (Signed)
Case Management Note  Patient Details  Name: Warren [name] MRN: [id number] Date of Birth: 09-Aug-1951  Subjective/Objective:                    Action/Plan:  CPM and bedside commode will be delivered to home . Dan HumphreysWalker was delivered to hospital room  Expected Discharge Date:                  Expected Discharge Plan:  Home w Home Health Services  In-House Referral:     Discharge planning Services  CM Consult  Post Acute Care Choice:  Home Health, Durable Medical Equipment Choice offered to:  Patient  DME Arranged:  3-N-1, Walker rolling DME Agency:     HH Arranged:  PT [facility]  Status of Service:  Completed, signed off  If discussed at [employer] Length of Stay Meetings, dates discussed:    Additional Comments:  Kingsley PlanWile, Samra Pesch Marie, RN 08/23/2016, 10:18 AM

## 2016-08-23 NOTE — Progress Notes (Signed)
Subjective: 1 Day Post-Op Procedure(s) (LRB): TOTAL KNEE ARTHROPLASTY (Right) Patient reports pain as mild.  Patient doing great this am.  Objective: Vital signs in last 24 hours: Temp:  [97.4 F (36.3 C)-98.4 F (36.9 C)] 98 F (36.7 C) (12/14 0420) Pulse Rate:  [47-67] 56 (12/14 0420) Resp:  [10-19] 16 (12/14 0420) BP: (97-160)/(50-88) 145/78 (12/14 0420) SpO2:  [91 %-100 %] 98 % (12/14 0420) Weight:  [126.7 kg (279 lb 6 oz)] 126.7 kg (279 lb 6 oz) (12/13 1028)  Intake/Output from previous day: 12/13 0701 - 12/14 0700 In: 3405 [P.O.:760; I.V.:2485; IV Piggyback:160] Out: 2605 [Urine:2600; Blood:5] Intake/Output this shift: No intake/output data recorded.   Recent Labs  08/23/16 0511  HGB 13.8    Recent Labs  08/23/16 0511  WBC 11.0*  RBC 4.63  HCT 42.1  PLT 246    Recent Labs  08/23/16 0511  NA 140  K 3.8  CL 102  CO2 30  BUN 12  CREATININE 1.04  GLUCOSE 90  CALCIUM 8.8*   No results for input(s): LABPT, INR in the last 72 hours.  Neurologically intact Neurovascular intact Sensation intact distally Intact pulses distally Dorsiflexion/Plantar flexion intact Incision: dressing C/D/I No cellulitis present Compartment soft  Assessment/Plan: 1 Day Post-Op Procedure(s) (LRB): TOTAL KNEE ARTHROPLASTY (Right) Advance diet Up with therapy D/C IV fluids Discharge home with home health after second session of PT today WBAT RLE ABLA-mild and stable Please remove ace bandage and apply ted hose prior to d/c  Otilio SaberM Lindsey Carissa Musick 08/23/2016, 7:20 AM

## 2016-08-23 NOTE — Progress Notes (Signed)
Physical Therapy Treatment Patient Details Name: Warren [name] MRN: [id number] DOB: May 06, 1951 Today's Date: 08/23/2016    History of Present Illness 65 y.o. male [facility]16 for elective R TKA.  Pt with significant PMhx of obesity, HTN, and bil foot surgery (for hammer toes).     PT Comments    Pt is POD #1 and is feeling much better than last night.  No nausea or lightheadedness today.  Supervision to min guard assist for mobility and gait with RW.  HEP program initiated. PT will continue to follow acutely.   Follow Up Recommendations  Home health PT;Supervision - Intermittent     Equipment Recommendations  Rolling walker with 5" wheels;3in1 (PT)    Recommendations for Other Services   NA     Precautions / Restrictions Precautions Precautions: Knee Precaution Booklet Issued: Yes (comment) Precaution Comments: reviewed first page of knee exercise handout.  Restrictions Weight Bearing Restrictions: Yes RLE Weight Bearing: Weight bearing as tolerated    Mobility  Bed Mobility               General bed mobility comments: Pt was OOB in chair  Transfers Overall transfer level: Needs assistance Equipment used: Rolling walker (2 wheeled) Transfers: Sit to/from Stand Sit to Stand: Supervision         General transfer comment: supervision for safety from low chair due to heavy reliance on arms for support during transitions.   Ambulation/Gait Ambulation/Gait assistance: Min guard Ambulation Distance (Feet): 150 Feet Assistive device: Rolling walker (2 wheeled) Gait Pattern/deviations: Step-through pattern;Antalgic Gait velocity: decreased Gait velocity interpretation: Below normal speed for age/gender General Gait Details: Reinforced correct LE sequencing, safe RW use (pt self cueing to not pick up the RW every time).     Stairs Stairs: Yes   Stair Management: No rails;Step to pattern;Forwards;With walker Number of Stairs: 1 General stair  comments: Practiced one curb step to simulate small step to get into kitchen.  Verbal cues for correct LE sequencing and RW use.          Balance Overall balance assessment: Needs assistance Sitting-balance support: No upper extremity supported;Feet supported;Bilateral upper extremity supported Sitting balance-Leahy Scale: Good     Standing balance support: Bilateral upper extremity supported Standing balance-Leahy Scale: Fair                     Cognition Arousal/Alertness: Awake/alert Behavior During Therapy: WFL for tasks assessed/performed Overall Cognitive Status: Within Functional Limits for tasks assessed                      Exercises Total Joint Exercises Ankle Circles/Pumps: AROM;Both;10 reps Quad Sets: AROM;Right;10 reps Towel Squeeze: AROM;Both;10 reps Heel Slides: AAROM;Right;10 reps        Pertinent Vitals/Pain Pain Assessment: 0-10 Pain Score: 5  Pain Location: Right knee "no more than 5 when I am moving" Pain Descriptors / Indicators: Grimacing;Guarding;Aching Pain Intervention(s): Limited activity within patient's tolerance;Monitored during session;Repositioned;Ice applied           PT Goals (current goals can now be found in the care plan section) Acute Rehab PT Goals Patient Stated Goal: to go home today Progress towards PT goals: Progressing toward goals    Frequency    7X/week      PT Plan Current plan remains appropriate       End of Session   Activity Tolerance: Patient limited by pain Patient left: in chair;with call bell/phone within reach;Other (  comment) (in knee foam)     Time: [id number] PT Time Calculation (min) (ACUTE ONLY): 20 min  Charges:  $Gait Training: 8-22 mins                      Dazhane Villagomez B. Dollene Mallery, PT, DPT [phone]   08/23/2016, 11:34 AM

## 2016-08-23 NOTE — Op Note (Signed)
NAME:  [place]                     ACCOUNT NO.:  MEDICAL RECORD NO.:  [id number]  LOCATION:                                 FACILITY:  PHYSICIAN:  Loreta Aveaniel F. Sequoyah Counterman, M.D. DATE OF BIRTH:  [birth year]  DATE OF PROCEDURE:  08/22/2016 DATE OF DISCHARGE:                              OPERATIVE REPORT   PREOPERATIVE DIAGNOSIS:  Right knee end-stage arthritis, primary generalized.  POSTOPERATIVE DIAGNOSIS:  Right knee end-stage arthritis, primary generalized.  PROCEDURE:  Right knee modified minimally invasive total knee replacement Stryker triathlon prosthesis.  Cemented pegged posterior stabilized #7 femoral component.  Cemented #7 tibial component, 9 mm PS insert.  Cemented resurfacing 38-mm patellar component.  SURGEON:  Loreta Aveaniel F. Vy Badley, M.D.  ASSISTANT:  Mikey KirschnerLindsey Stanberry, PA, present throughout the entire case and necessary for timely completion of the procedure.  ANESTHESIA:  Spinal.  BLOOD LOSS:  Minimal.  SPECIMENS:  None.  CULTURES:  None.  COMPLICATIONS:  None.  DRESSINGS:  Soft compressive knee immobilizer.  TOURNIQUET TIME:  1 hour.  DESCRIPTION OF PROCEDURE:  The patient was brought to the operating room and after adequate anesthesia had been obtained, tourniquet applied. Prepped and draped in usual sterile fashion.  Exsanguinated with elevation of Esmarch.  Tourniquet inflated to 350 mmHg.  Straight incision above the patella down the tibial tubercle.  Medial arthrotomy, vastus splitting, preserving quad tendon.  Flexible intramedullary guide distal femur.  8 mm resection, 5 degrees of valgus.  Using epicondylar axis, the femur was sized, cut, and fitted for a posterior stabilized pegged #7 component.  Proximal tibial resection with extramedullary guide.  Size #7 component.  Rotation set with trials.  Resurfacing patella 38 mm component after 10 mm resection.  All trials removed. Copious irrigation with a pulse irrigating device.  Cement  prepared, placed on all components, which were then firmly seated.  Polyethylene attached to tibia and knee reduced.  Patella held with a clamp.  Once the cement hardened, the knee was irrigated once again.  Soft tissue was injected with Exparel.  Arthrotomy was closed with Vicryl.  Subcutaneous and subcuticular closure.  Margins were injected with Marcaine.  Sterile compressive dressing applied.  Tourniquet inflated and removed.  Knee immobilizer applied.  Anesthesia reversed. Of note, at completion, I was very pleased with full extension, full flexion, nicely balanced knee, good stability throughout, and great patellofemoral tracking.  Anesthesia reversed.  Brought to the recovery room.  Tolerated the surgery well.  No complications.     Loreta Aveaniel F. Tomy Khim, M.D.     DFM/MEDQ  D:  08/22/2016  T:  08/23/2016  Job:  [id number]

## 2016-08-23 NOTE — Progress Notes (Signed)
Physical Therapy Treatment Patient Details Name: Warren [name] MRN: [id number] DOB: [birth year] Today's Date: 08/23/2016    History of Present Illness 65 y.o. male [facility]22/16 for elective R TKA.  Pt with significant PMhx of obesity, HTN, and bil foot surgery (for hammer toes).     PT Comments    Pt is POD #1 and this is his second session.  He is progressing very well with his mobility and was able to demonstrate safety on the step to get to his kitchen.  He completed his HEP and has no further questions. He is due to d/c home with wife later today.    Follow Up Recommendations  Home health PT;Supervision - Intermittent     Equipment Recommendations  Rolling walker with 5" wheels;3in1 (PT)    Recommendations for Other Services   NA     Precautions / Restrictions Precautions Precautions: Knee Precaution Booklet Issued: Yes (comment) Precaution Comments: reviewed the rest of his knee exercises and no pillow precaution.  Restrictions RLE Weight Bearing: Weight bearing as tolerated    Mobility  Bed Mobility Overal bed mobility: Modified Independent             General bed mobility comments: able to get EOB from flat bed without rails, extra time needed to complete.   Transfers Overall transfer level: Needs assistance Equipment used: Rolling walker (2 wheeled) Transfers: Sit to/from Stand Sit to Stand: Supervision         General transfer comment: supervision for safety.   Ambulation/Gait Ambulation/Gait assistance: Supervision Ambulation Distance (Feet): 180 Feet Assistive device: Rolling walker (2 wheeled) Gait Pattern/deviations: Step-through pattern;Antalgic Gait velocity: decreased Gait velocity interpretation: Below normal speed for age/gender General Gait Details: Very mildly antalgic gait pattern, good heel to toe stepping, safe RW use.    Stairs Stairs: Yes   Stair Management: No rails;Step to pattern;Forwards;With walker Number  of Stairs: 1 General stair comments: Pt was able to demonstrate what he learned earlier today with stair training without cues from PT.          Balance Overall balance assessment: Needs assistance Sitting-balance support: Feet supported;No upper extremity supported Sitting balance-Leahy Scale: Good     Standing balance support: Single extremity supported;Bilateral upper extremity supported;No upper extremity supported Standing balance-Leahy Scale: Fair                      Cognition Arousal/Alertness: Awake/alert Behavior During Therapy: WFL for tasks assessed/performed Overall Cognitive Status: Within Functional Limits for tasks assessed                      Exercises Total Joint Exercises Short Arc Quad: AROM;Right;10 reps Heel Slides: AROM;AAROM;Right;10 reps;Seated Hip ABduction/ADduction: AROM;Right;10 reps Straight Leg Raises: AROM;Right;10 reps Long Arc Quad: AROM;Right;10 reps Goniometric ROM: 0-90        Pertinent Vitals/Pain Pain Assessment: 0-10 Pain Score: 4  Pain Location: right knee Pain Descriptors / Indicators: Grimacing;Guarding;Aching Pain Intervention(s): Limited activity within patient's tolerance;Monitored during session;Premedicated before session;Repositioned           PT Goals (current goals can now be found in the care plan section) Acute Rehab PT Goals Patient Stated Goal: to go home today Progress towards PT goals: Progressing toward goals    Frequency    7X/week      PT Plan Current plan remains appropriate       End of Session   Activity Tolerance: Patient tolerated treatment well  Patient left: in chair;with call bell/phone within reach     Time: 1451-1522 PT Time Calculation (min) (ACUTE ONLY): 31 min  Charges:  $Gait Training: 8-22 mins $Therapeutic Exercise: 8-22 mins                      Shiloh Southern B. Kayleeann Huxford, PT, DPT [phone]   08/23/2016, 4:51 PM

## 2016-08-23 NOTE — Evaluation (Addendum)
Occupational Therapy Evaluation/Discharge Patient Details Name: Warren [name] MRN: [id number] DOB: 1951-07-16 Today's Date: 08/23/2016    History of Present Illness [age] y.o. male [facility] for elective R TKA.  Pt with significant PMhx of obesity, HTN, and bil foot surgery.     Clinical Impression   PTA, pt was independent with ADL and functional mobility and was working. Pt currently requires supervision with all ADL and functional mobility. Pt reports that his wife will work during the day but will be with him for 24 hour assistance over the next 2-3 days and he will have assistance PRN from his daughters. Pt educated on safe shower transfers, safe use of DME, dressing techniques, fall prevention, and energy conservation as well as knee precautions during ADL. Pt reports and demonstrates understanding of all topics. No further OT needs identified. Recommend 24 hour assistance post-acute D/C with no OT follow-up recommended. Pt would benefit from 3-in-1 for DME needs. OT will sign off acutely.     Follow Up Recommendations  No OT follow up;Supervision/Assistance - 24 hour    Equipment Recommendations  3 in 1 bedside commode    Recommendations for Other Services       Precautions / Restrictions Precautions Precautions: Knee Precaution Booklet Issued: No Precaution Comments: reviewed knee precautions Restrictions Weight Bearing Restrictions: Yes RLE Weight Bearing: Weight bearing as tolerated      Mobility Bed Mobility               General bed mobility comments: Received in recliner  Transfers Overall transfer level: Needs assistance Equipment used: Rolling walker (2 wheeled) Transfers: Sit to/from Stand Sit to Stand: Supervision              Balance Overall balance assessment: Needs assistance Sitting-balance support: Feet supported;No upper extremity supported Sitting balance-Leahy Scale: Good     Standing balance support: Bilateral  upper extremity supported;No upper extremity supported;During functional activity Standing balance-Leahy Scale: Fair Standing balance comment: Able to statically stand without UE support.                            ADL Overall ADL's : Needs assistance/impaired     Grooming: Oral care;Supervision/safety;Standing   Upper Body Bathing: Set up;Sitting   Lower Body Bathing: Sit to/from stand;Supervison/ safety   Upper Body Dressing : Set up;Sitting   Lower Body Dressing: Sit to/from stand;Supervision/safety   Toilet Transfer: Supervision/safety;Ambulation;BSC   Toileting- ArchitectClothing Manipulation and Hygiene: Supervision/safety;Sit to/from stand   Tub/ Shower Transfer: Supervision/safety;Walk-in shower;Ambulation;Rolling walker;3 in 1   Functional mobility during ADLs: Supervision/safety;Rolling walker General ADL Comments: Pt educated on dressing techniques, safe shower transfers, use of ice for pain management, and knee precautions during ADL.     Vision Vision Assessment?: No apparent visual deficits   Perception     Praxis      Pertinent Vitals/Pain Pain Assessment: 0-10 Pain Score: 5  Pain Location: R knee Pain Descriptors / Indicators: Guarding;Aching;Sore Pain Intervention(s): Limited activity within patient's tolerance;Monitored during session;Repositioned;Ice applied     Hand Dominance Left   Extremity/Trunk Assessment Upper Extremity Assessment Upper Extremity Assessment: Overall WFL for tasks assessed   Lower Extremity Assessment Lower Extremity Assessment: RLE deficits/detail RLE Deficits / Details: Decreased strength and ROM as expected post-operatively.       Communication Communication Communication: No difficulties   Cognition Arousal/Alertness: Awake/alert Behavior During Therapy: WFL for tasks assessed/performed Overall Cognitive Status: Within Functional Limits for  tasks assessed                     General Comments        Exercises       Shoulder Instructions      Home Living Family/patient expects to be discharged to:: Private residence Living Arrangements: Spouse/significant other Available Help at Discharge: Family;Available PRN/intermittently (Wife works full time; daughters can check in anytime, Wife will be home for 24 hour assistance for the rest of this weekend) Type of Home: House Home Access: Level entry;Other (comment) (1 small step up into the kitchen)     Home Layout: One level     Bathroom Shower/Tub: Producer, television/film/videoWalk-in shower   Bathroom Toilet: Handicapped height Bathroom Accessibility: Yes How Accessible: Accessible via walker Home Equipment: Grab bars - tub/shower;Hand held shower head;Crutches          Prior Functioning/Environment Level of Independence: Independent        Comments: 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        OT Problem List: Decreased strength;Decreased range of motion;Decreased activity tolerance;Impaired balance (sitting and/or standing);Decreased safety awareness;Decreased knowledge of use of DME or AE;Decreased knowledge of precautions;Pain   OT Treatment/Interventions:      OT Goals(Current goals can be found in the care plan section) Acute Rehab OT Goals Patient Stated Goal: to go home today OT Goal Formulation: With patient Time For Goal Achievement: 09/06/16 Potential to Achieve Goals: Good  OT Frequency:     Barriers to D/C:            Co-evaluation              End of Session Equipment Utilized During Treatment: Gait belt;Rolling walker  Activity Tolerance: Patient tolerated treatment well Patient left: in chair;with call bell/phone within reach   Time: 1007-1036 OT Time Calculation (min): 29 min Charges:  OT General Charges $OT Visit: 1 Procedure OT Evaluation $OT Eval Moderate Complexity: 1 Procedure OT Treatments $Self Care/Home Management : 8-22 mins  Doristine SectionCharity A Marlowe Cinquemani, OTR/L [phone] 08/23/2016, 11:16 AM

## 2016-09-05 ENCOUNTER — Ambulatory Visit: Payer: BLUE CROSS/BLUE SHIELD | Admitting: Family Medicine

## 2016-10-16 NOTE — H&P (Signed)
TOTAL KNEE ADMISSION H&P  Patient is being admitted for left total knee arthroplasty.  Subjective:  Chief Complaint:left knee pain.  HPI: Warren [name] [place], 66 y.o. male, has a history of pain and functional disability in the left knee due to arthritis and has failed non-surgical conservative treatments for greater than 12 weeks to includeNSAID's and/or analgesics, corticosteriod injections and activity modification.  Onset of symptoms was gradual, starting 4 years ago with gradually worsening course since that time. The patient noted no past surgery on the left knee(s).  Patient currently rates pain in the left knee(s) at 5 out of 10 with activity. Patient has night pain, pain that interferes with activities of daily living and crepitus.  Patient has evidence of subchondral sclerosis and joint space narrowing by imaging studies. This patient has had nothing. There is no active infection.  Patient Active Problem List   Diagnosis Date Noted  . Primary localized osteoarthritis of right knee 08/22/2016  . Abnormal EKG 08/06/2016  . Encounter for medication monitoring 05/15/2016  . OA (osteoarthritis) of knee 05/15/2016  . Erectile dysfunction 02/27/2016  . Status post left foot surgery 01/26/2016  . Hammer toe of left foot 12/25/2015  . Hammertoe 11/29/2015  . Status post right foot surgery 11/29/2015  . Obesity 02/24/2015  . Chondromalacia 02/24/2015  . Obstructive sleep apnea of adult 02/24/2015  . Screening examination for poliomyelitis 02/24/2015  . Acquired acanthosis nigricans 02/01/2010  . Flat foot 07/05/2008  . Allergic rhinitis 01/12/2008  . Decreased libido 01/06/2008  . Hypercholesterolemia without hypertriglyceridemia 10/08/2007  . Benign essential HTN 08/13/2007   Past Medical History:  Diagnosis Date  . Erectile dysfunction 02/27/2016  . Hypercholesterolemia without hypertriglyceridemia 10/08/2007  . Hypertension   . [facility]  . OA  (osteoarthritis) of knee 05/15/2016   Managed by ortho  . Obesity 02/24/2015  . Obstructive sleep apnea of adult 02/24/2015    Past Surgical History:  Procedure Laterality Date  . FOOT SURGERY    . FOOT SURGERY Bilateral    March and May  . TOTAL KNEE ARTHROPLASTY Right 08/22/2016  . TOTAL KNEE ARTHROPLASTY Right 08/22/2016   Procedure: TOTAL KNEE ARTHROPLASTY;  Surgeon: Loreta Aveaniel F Murphy, MD;  [facility];  Service: Orthopedics;  Laterality: Right;    No prescriptions prior to admission.   No Known Allergies  Social History  Substance Use Topics  . Smoking status: Never Smoker  . Smokeless tobacco: Never Used  . Alcohol use 0.0 oz/week     Comment: occasional wine    Family History  Problem Relation Age of Onset  . Heart disease Mother      ROS  Objective:  Physical Exam  Vital signs in last 24 hours:    Labs:   Estimated body mass index is 37.89 kg/m as calculated from the following:   Height as of 08/22/16: 6' (1.829 m).   Weight as of 08/22/16: 126.7 kg (279 lb 6 oz).   Imaging Review Plain radiographs demonstrate severe degenerative joint disease of the left knee(s). The overall alignment ismild varus. The bone quality appears to be fair for age and reported activity level.  Assessment/Plan:  End stage arthritis, left knee   The patient history, physical examination, clinical judgment of the provider and imaging studies are consistent with end stage degenerative joint disease of the left knee(s) and total knee arthroplasty is deemed medically necessary. The treatment options including medical management, injection therapy arthroscopy and arthroplasty were discussed  at length. The risks and benefits of total knee arthroplasty were presented and reviewed. The risks due to aseptic loosening, infection, stiffness, patella tracking problems, thromboembolic complications and other imponderables were discussed. The patient acknowledged the explanation, agreed to  proceed with the plan and consent was signed. Patient is being admitted for inpatient treatment for surgery, pain control, PT, OT, prophylactic antibiotics, VTE prophylaxis, progressive ambulation and ADL's and discharge planning. The patient is planning to be discharged home with home health services

## 2016-10-17 ENCOUNTER — Encounter (HOSPITAL_COMMUNITY)
Admission: RE | Admit: 2016-10-17 | Discharge: 2016-10-17 | Disposition: A | Discharge status: Home / Self Care | Payer: BLUE CROSS/BLUE SHIELD | Source: Ambulatory Visit | Attending: Orthopedic Surgery | Admitting: Orthopedic Surgery

## 2016-10-17 ENCOUNTER — Encounter (HOSPITAL_COMMUNITY): Payer: Self-pay

## 2016-10-17 DIAGNOSIS — M1712 Unilateral primary osteoarthritis, left knee: Secondary | ICD-10-CM | POA: Insufficient documentation

## 2016-10-17 DIAGNOSIS — Z01812 Encounter for preprocedural laboratory examination: Secondary | ICD-10-CM | POA: Diagnosis present

## 2016-10-17 LAB — BASIC METABOLIC PANEL
ANION GAP: 10 (ref 5–15)
BUN: 16 mg/dL (ref 6–20)
CALCIUM: 9.5 mg/dL (ref 8.9–10.3)
CO2: 26 mmol/L (ref 22–32)
Chloride: 104 mmol/L (ref 101–111)
Creatinine, Ser: 1.14 mg/dL (ref 0.61–1.24)
Glucose, Bld: 102 mg/dL — ABNORMAL HIGH (ref 65–99)
Potassium: 3.3 mmol/L — ABNORMAL LOW (ref 3.5–5.1)
SODIUM: 140 mmol/L (ref 135–145)

## 2016-10-17 LAB — CBC
HCT: 41.7 % (ref 39.0–52.0)
HEMOGLOBIN: 14.1 g/dL (ref 13.0–17.0)
MCH: 29.5 pg (ref 26.0–34.0)
MCHC: 33.8 g/dL (ref 30.0–36.0)
MCV: 87.2 fL (ref 78.0–100.0)
Platelets: 282 10*3/uL (ref 150–400)
RBC: 4.78 MIL/uL (ref 4.22–5.81)
RDW: 12.6 % (ref 11.5–15.5)
WBC: 7.4 10*3/uL (ref 4.0–10.5)

## 2016-10-17 LAB — TYPE AND SCREEN
ABO/RH(D): O POS
Antibody Screen: NEGATIVE

## 2016-10-17 LAB — SURGICAL PCR SCREEN
MRSA, PCR: NEGATIVE
STAPHYLOCOCCUS AUREUS: NEGATIVE

## 2016-10-17 NOTE — Progress Notes (Signed)
Pt. Reports that he was told many yrs. Ago that he a malignant hyperthermia event under anesth., He reports that he was told that it was a "mild case".  Pt. Had surgery in 08/2016, all went well. Call to A. Zelenak,PA-C, reported the same, therefore the chart will be passed on to that team. Pt. Denies all chest complaints., he reports that the cardiac review that he had with Dr. Juliann Paresallwood in Dec. 2017, cleared him for surgery then.

## 2016-10-17 NOTE — Pre-Procedure Instructions (Signed)
Antinio L [place]  10/17/2016      [id number]-  [facility], [place] - [address] [place] [address] [place] [id number] Phone: [phone] Fax: [phone]  [facility] [place] - [address] [place] [id number] Phone: [phone] Fax: [phone]  [facility] [place] - [address] [place] [id number] Phone: [phone] Fax: [phone]    Your procedure is scheduled on 2/212/2018  [facility]:30 [facility].M.  Call this number if you have problems the morning of surgery:  [phone]   Remember:  Do not eat food or drink liquids after midnight.  Take these medicines the morning of surgery with A SIP OF WATER : AMLODIPINE  STOP ASPIRIN: 10/26/2016    Do not wear jewelry   Do not wear lotions, powders, or perfumes, or deoderant.   Do not shave 48 hours prior to surgery.  Men may shave face and neck.   Do not bring valuables to the hospital.   [facility] is not responsible for any belongings or valuables.  Contacts, dentures or bridgework may not be worn into surgery.  Leave your suitcase in the car.  After surgery it may be brought to your room.  For patients admitted to the hospital, discharge time will be determined by your treatment team.  Patients discharged the day of surgery will not be allowed to drive home.   Name and phone number of your driver:   Wife  & daughter  Special instructions:  Special Instructions: [facility] - Preparing for Surgery  Before surgery, you can play an important role.  Because skin is not sterile, your skin needs to be as free of germs as possible.  You can reduce the number of germs on you skin by washing with CHG (chlorahexidine gluconate) soap before surgery.  CHG is an antiseptic cleaner which kills germs and bonds with the skin to continue killing germs even after  washing.  Please DO NOT use if you have an allergy to CHG or antibacterial soaps.  If your skin becomes reddened/irritated stop using the CHG and inform your nurse when you arrive at Short Stay.  Do not shave (including legs and underarms) for at least 48 hours prior to the first CHG shower.  You may shave your face.  Please follow these instructions carefully:   1.  Shower with CHG Soap the night before surgery and the  morning of Surgery.  2.  If you choose to wash your hair, wash your hair first as usual with your  normal shampoo.  3.  After you shampoo, rinse your hair and body thoroughly to remove the  Shampoo.  4.  Use CHG as you would any other liquid soap.  You can apply chg directly to the skin and wash gently with scrungie or a clean washcloth.  5.  Apply the CHG Soap to your body ONLY FROM THE NECK DOWN.    Do not use on open wounds or open sores.  Avoid contact with your eyes, ears, mouth and genitals (private parts).  Wash genitals (private parts)   with your normal soap.  6.  Wash thoroughly, paying special attention to the area where your surgery will be performed.  7.  Thoroughly rinse your body with warm water from the neck down.  8.  DO NOT shower/wash with your normal  soap after using and rinsing off   the CHG Soap.  9.  Pat yourself dry with a clean towel.            10.  Wear clean pajamas.            11.  Place clean sheets on your bed the night of your first shower and do not sleep with pets.  Day of Surgery  Do not apply any lotions/deodorants the morning of surgery.  Please wear clean clothes to the hospital/surgery center.  Please read over the following fact sheets that you were given. Pain Booklet, Coughing and Deep Breathing, MRSA Information and Surgical Site Infection Prevention

## 2016-10-18 NOTE — Progress Notes (Signed)
Anesthesia Chart Review:  Pt is a 66 year old male scheduled for L total knee arthroplasty on 10/31/2016 with Warren Jewelaniel Murphy, MD.   - PCP is Warren GoutyMelinda Lada, MD  PMH includes:  HTN, hyperlipidemia, OSA. Never smoker. BMI 37. S/p R TKA 08/22/16.   Anesthesia history: Malignant hyperthermia ~1986  Medications include: amlodipine, ASA, losartan-hctz, cialis  Preoperative labs reviewed.    EKG 08/06/16: Sinus rhythm. RAD. RSR' (V1)-probable normal variant. Inferior/lateral T-wave changes may be due to myocardial ischemia  Nuclear stress test 08/17/16 (care everywhere):  - Borderline myocardial perfusion scan. - LV enlargement, mildly depressed overall LV function globally, no evidence of stress-induced myocardial ischemia, ejection fraction of 45%  Pt tolerated R TKA 2 months ago without issue. If no changes, I anticipate pt can proceed with surgery as scheduled.   Warren Mastngela Lynford Espinoza, FNP-BC [facility] Phone: [phone] 10/18/2016 4:39 PM

## 2016-10-19 ENCOUNTER — Other Ambulatory Visit (HOSPITAL_COMMUNITY): Payer: BLUE CROSS/BLUE SHIELD

## 2016-10-25 ENCOUNTER — Ambulatory Visit: Payer: BLUE CROSS/BLUE SHIELD | Admitting: Family Medicine

## 2016-10-30 ENCOUNTER — Ambulatory Visit (INDEPENDENT_AMBULATORY_CARE_PROVIDER_SITE_OTHER): Payer: BLUE CROSS/BLUE SHIELD | Admitting: Family Medicine

## 2016-10-30 ENCOUNTER — Encounter: Payer: Self-pay | Admitting: Family Medicine

## 2016-10-30 VITALS — BP 144/90 | HR 73 | Temp 98.6°F | Resp 16 | Wt 276.0 lb

## 2016-10-30 DIAGNOSIS — M170 Bilateral primary osteoarthritis of knee: Secondary | ICD-10-CM

## 2016-10-30 DIAGNOSIS — E782 Mixed hyperlipidemia: Secondary | ICD-10-CM

## 2016-10-30 DIAGNOSIS — Z6835 Body mass index (BMI) 35.0-35.9, adult: Secondary | ICD-10-CM

## 2016-10-30 DIAGNOSIS — E6609 Other obesity due to excess calories: Secondary | ICD-10-CM

## 2016-10-30 DIAGNOSIS — E876 Hypokalemia: Secondary | ICD-10-CM | POA: Diagnosis not present

## 2016-10-30 DIAGNOSIS — I10 Essential (primary) hypertension: Secondary | ICD-10-CM | POA: Diagnosis not present

## 2016-10-30 MED ORDER — CEFAZOLIN SODIUM 10 G IJ SOLR
3.0000 g | INTRAMUSCULAR | Status: AC
  Administered 2016-10-31: 3 g via INTRAVENOUS
  Filled 2016-10-30: qty 3000

## 2016-10-30 MED ORDER — TRANEXAMIC ACID 1000 MG/10ML IV SOLN
1000.0000 mg | INTRAVENOUS | Status: AC
  Administered 2016-10-31: 1000 mg via INTRAVENOUS
  Filled 2016-10-30: qty 10

## 2016-10-30 MED ORDER — AMLODIPINE BESYLATE 5 MG PO TABS: 7.5000 mg | ORAL_TABLET | Freq: Every day | ORAL | 3 refills | Status: DC

## 2016-10-30 MED ORDER — LACTATED RINGERS IV SOLN
INTRAVENOUS | Status: DC
  Administered 2016-10-31 (×2): via INTRAVENOUS

## 2016-10-30 MED ORDER — POTASSIUM CHLORIDE ER 10 MEQ PO TBCR: 10.0000 meq | EXTENDED_RELEASE_TABLET | Freq: Two times a day (BID) | ORAL | 0 refills | Status: DC

## 2016-10-30 NOTE — Patient Instructions (Addendum)
Increase the amlodipine to 7.5 mg daily If your diastolic isn't staying below 80-85, let me know Start the potassium for just two days Avoid salt substitutes and fruit smoothies Try the DASH guidelines Keep up your efforts for weight loss  DASH Eating Plan DASH stands for "Dietary Approaches to Stop Hypertension." The DASH eating plan is a healthy eating plan that has been shown to reduce high blood pressure (hypertension). Additional health benefits may include reducing the risk of type 2 diabetes mellitus, heart disease, and stroke. The DASH eating plan may also help with weight loss. What do I need to know about the DASH eating plan? For the DASH eating plan, you will follow these general guidelines:  Choose foods with less than 150 milligrams of sodium per serving (as listed on the food label).  Use salt-free seasonings or herbs instead of table salt or sea salt.  Check with your health care provider or pharmacist before using salt substitutes.  Eat lower-sodium products. These are often labeled as "low-sodium" or "no salt added."  Eat fresh foods. Avoid eating a lot of canned foods.  Eat more vegetables, fruits, and low-fat dairy products.  Choose whole grains. Look for the word "whole" as the first word in the ingredient list.  Choose fish and skinless chicken or [place] more often than red meat. Limit fish, poultry, and meat to 6 oz (170 g) each day.  Limit sweets, desserts, sugars, and sugary drinks.  Choose heart-healthy fats.  Eat more home-cooked food and less restaurant, buffet, and fast food.  Limit fried foods.  Do not fry foods. Cook foods using methods such as baking, boiling, grilling, and broiling instead.  When eating at a restaurant, ask that your food be prepared with less salt, or no salt if possible. What foods can I eat? Seek help from a dietitian for individual calorie needs. Grains  Whole grain or whole wheat bread. Brown rice. Whole grain or whole  wheat pasta. Quinoa, bulgur, and whole grain cereals. Low-sodium cereals. Corn or whole wheat flour tortillas. Whole grain cornbread. Whole grain crackers. Low-sodium crackers. Vegetables  Fresh or frozen vegetables (raw, steamed, roasted, or grilled). Low-sodium or reduced-sodium tomato and vegetable juices. Low-sodium or reduced-sodium tomato sauce and paste. Low-sodium or reduced-sodium canned vegetables. Fruits  All fresh, canned (in natural juice), or frozen fruits. Meat and Other Protein Products  Ground beef (85% or leaner), grass-fed beef, or beef trimmed of fat. Skinless chicken or [place]. Ground chicken or [place]. Pork trimmed of fat. All fish and seafood. Eggs. Dried beans, peas, or lentils. Unsalted nuts and seeds. Unsalted canned beans. Dairy  Low-fat dairy products, such as skim or 1% milk, 2% or reduced-fat cheeses, low-fat ricotta or cottage cheese, or plain low-fat yogurt. Low-sodium or reduced-sodium cheeses. Fats and Oils  Tub margarines without trans fats. Light or reduced-fat mayonnaise and salad dressings (reduced sodium). Avocado. Safflower, olive, or canola oils. Natural peanut or almond butter. Other  Unsalted popcorn and pretzels. The items listed above may not be a complete list of recommended foods or beverages. Contact your dietitian for more options.  What foods are not recommended? Grains  White bread. White pasta. White rice. Refined cornbread. Bagels and croissants. Crackers that contain trans fat. Vegetables  Creamed or fried vegetables. Vegetables in a cheese sauce. Regular canned vegetables. Regular canned tomato sauce and paste. Regular tomato and vegetable juices. Fruits  Canned fruit in light or heavy syrup. Fruit juice. Meat and Other Protein Products  Fatty cuts of  meat. Ribs, chicken wings, bacon, sausage, bologna, salami, chitterlings, fatback, hot dogs, bratwurst, and packaged luncheon meats. Salted nuts and seeds. Canned beans with salt. Dairy   Whole or 2% milk, cream, half-and-half, and cream cheese. Whole-fat or sweetened yogurt. Full-fat cheeses or blue cheese. Nondairy creamers and whipped toppings. Processed cheese, cheese spreads, or cheese curds. Condiments  Onion and garlic salt, seasoned salt, table salt, and sea salt. Canned and packaged gravies. Worcestershire sauce. Tartar sauce. Barbecue sauce. Teriyaki sauce. Soy sauce, including reduced sodium. Steak sauce. Fish sauce. Oyster sauce. Cocktail sauce. Horseradish. Ketchup and mustard. Meat flavorings and tenderizers. Bouillon cubes. Hot sauce. Tabasco sauce. Marinades. Taco seasonings. Relishes. Fats and Oils  Butter, stick margarine, lard, shortening, ghee, and bacon fat. Coconut, palm kernel, or palm oils. Regular salad dressings. Other  Pickles and olives. Salted popcorn and pretzels. The items listed above may not be a complete list of foods and beverages to avoid. Contact your dietitian for more information.  Where can I find more information? National Heart, Lung, and Blood Institute: CablePromo.itwww.nhlbi.nih.gov/health/health-topics/topics/dash/ This information is not intended to replace advice given to you by your health care provider. Make sure you discuss any questions you have with your health care provider. Document Released: 08/16/2011 Document Revised: 02/02/2016 Document Reviewed: 07/01/2013 Elsevier Interactive Patient Education  2017 [employer]

## 2016-10-30 NOTE — Assessment & Plan Note (Signed)
Increase amlodipine from 5 mg daily to 7.5 mg daily; patient to monitor and call if diastolic not better controlled; he agrees; continue to work on weight loss

## 2016-10-30 NOTE — Assessment & Plan Note (Signed)
Planning for total knee replacement tomorrow; hopefully, increased activity will help him lose weight, and manage his cholesterol and BP better

## 2016-10-30 NOTE — Assessment & Plan Note (Signed)
Encouraged healthy eating, weight loss, and hopefully he'll increase activity level after knee replacement

## 2016-10-30 NOTE — Progress Notes (Signed)
BP (!) 144/90   Pulse 73   Temp 98.6 F (37 C) (Oral)   Resp 16   Wt 276 lb (125.2 kg)   SpO2 96%   BMI 35.44 kg/m    Subjective:    Patient ID: Warren [name], male    DOB: [birth year], 66 y.o.   MRN: [id number]  HPI: Warren [name] is a 66 y.o. male  Chief Complaint  Patient presents with  . Follow-up   He is here for follow-up of hypertension and cholesterol  He has noticed that his BP has been inconsistent; it will be good sometimes, but then just an occasional rise in doctor's office or store He measures BP at home all the time; sometimes really good, then checks at grocery store, it will have diaastolic 89-90; sits there and it will drop to 82 diastolic He brought in readings: 147/80, 130/77, 126/81 Diastolic is almost always good at home I asked about DASH guideines, and he tries Has lost ten pounds since last visit  He had the right knee replaced August 22, 2016 He gets the other knee done tomorrow, Dr. Eulah Pont Hoping to get even more active after the 2nd knee replacement; took 11k steps yesterday; very active Had abnormal EKG, earlier, checked out by Dr. Juliann Pares; he said "abnormal, but normal for [me]" he says  After recent surgery, they came to the house for PT; the nurse noticed that his heart skipped a beat now and then; not noticing it now; stopped taking the 325 mg aspirin and went back to 81 mg daily; ortho cleared him for that  TG 369 last time; high for him; HDL low at 38; not doing fish oil because of surgery; he'll start back after surgery and expects this to come down once he is more active, able to be on the fish oil, and loses more weight  Depression screen [facility] 2/9 10/30/2016 08/06/2016 05/15/2016 02/27/2016 06/27/2015  Decreased Interest 0 0 0 0 0  Down, Depressed, Hopeless 0 0 0 0 0  PHQ - 2 Score 0 0 0 0 0   Relevant past medical, surgical, family and social history reviewed Past Medical History:  Diagnosis Date  . Erectile dysfunction 02/27/2016    . Hypercholesterolemia without hypertriglyceridemia 10/08/2007  . Hypertension   . Malignant hyperthermia    at [facility]  . OA (osteoarthritis) of knee 05/15/2016   Managed by ortho  . Obesity 02/24/2015  . Obstructive sleep apnea of adult 02/24/2015   CPAP q night    Past Surgical History:  Procedure Laterality Date  . FOOT SURGERY Bilateral    hammer toes repair   . FOOT SURGERY Bilateral    March and May  . TOTAL KNEE ARTHROPLASTY Right 08/22/2016  . TOTAL KNEE ARTHROPLASTY Right 08/22/2016   Procedure: TOTAL KNEE ARTHROPLASTY;  Surgeon: Loreta Ave, MD;  Location: [facility] OR;  Service: Orthopedics;  Laterality: Right;   Family History  Problem Relation Age of Onset  . Heart disease Mother   . Stroke Mother   . Suicidality Father   . Cancer Sister     breast   Social History  Substance Use Topics  . Smoking status: Never Smoker  . Smokeless tobacco: Never Used  . Alcohol use 0.0 oz/week     Comment: occasional wine   Interim medical history since last visit reviewed. Allergies and medications reviewed  Review of Systems Per HPI unless specifically indicated above     Objective:    BP Marland Kitchen)  144/90   Pulse 73   Temp 98.6 F (37 C) (Oral)   Resp 16   Wt 276 lb (125.2 kg)   SpO2 96%   BMI 35.44 kg/m   Wt Readings from Last 3 Encounters:  10/30/16 276 lb (125.2 kg)  10/17/16 286 lb 1 oz (129.8 kg)  08/22/16 279 lb 6 oz (126.7 kg)    Physical Exam  Constitutional: He appears well-developed and well-nourished. No distress.  Weight loss noted  HENT:  Head: Normocephalic and atraumatic.  Eyes: EOM are normal. No scleral icterus.  Cardiovascular: Normal rate and regular rhythm.   No extrasystoles are present.  No murmur heard. Pulmonary/Chest: Effort normal and breath sounds normal.  Abdominal: Soft. Bowel sounds are normal. He exhibits no distension.  Musculoskeletal: He exhibits no edema.  Crepitus with active ROM of the LEFT knee  Neurological: He displays  no tremor. Gait normal.  Skin: Skin is warm and dry. No pallor.  Psychiatric: He has a normal mood and affect. His behavior is normal. Judgment and thought content normal.   Results for orders placed or performed during the hospital encounter of 10/17/16  Surgical pcr screen  Result Value Ref Range   MRSA, PCR NEGATIVE NEGATIVE   Staphylococcus aureus NEGATIVE NEGATIVE  Basic metabolic panel  Result Value Ref Range   Sodium 140 135 - 145 mmol/L   Potassium 3.3 (L) 3.5 - 5.1 mmol/L   Chloride 104 101 - 111 mmol/L   CO2 26 22 - 32 mmol/L   Glucose, Bld 102 (H) 65 - 99 mg/dL   BUN 16 6 - 20 mg/dL   Creatinine, Ser 9.60 0.61 - 1.24 mg/dL   Calcium 9.5 8.9 - 45.4 mg/dL   GFR calc non Af Amer >60 >60 mL/min   GFR calc Af Amer >60 >60 mL/min   Anion gap 10 5 - 15  CBC  Result Value Ref Range   WBC 7.4 4.0 - 10.5 K/uL   RBC 4.78 4.22 - 5.81 MIL/uL   Hemoglobin 14.1 13.0 - 17.0 g/dL   HCT 09.8 11.9 - 14.7 %   MCV 87.2 78.0 - 100.0 fL   MCH 29.5 26.0 - 34.0 pg   MCHC 33.8 30.0 - 36.0 g/dL   RDW 82.9 56.2 - 13.0 %   Platelets 282 150 - 400 K/uL  Type and screen Order type and screen if day of surgery is less than 15 days from draw of preadmission visit or order morning of surgery if day of surgery is greater than 6 days from preadmission visit.  Result Value Ref Range   ABO/RH(D) O POS    Antibody Screen NEG    Sample Expiration 10/31/2016    Extend sample reason NO TRANSFUSIONS OR PREGNANCY IN THE PAST 3 MONTHS       Assessment & Plan:   Problem List Items Addressed This Visit      Cardiovascular and Mediastinum   Benign essential HTN    Increase amlodipine from 5 mg daily to 7.5 mg daily; patient to monitor and call if diastolic not better controlled; he agrees; continue to work on weight loss      Relevant Medications   amLODipine (NORVASC) 5 MG tablet     Musculoskeletal and Integument   OA (osteoarthritis) of knee    Planning for total knee replacement tomorrow;  hopefully, increased activity will help him lose weight, and manage his cholesterol and BP better        Other   Obesity  encourgement given to work on weight loss, which should help lipids and pressure      Hyperlipidemia    Encouraged healthy eating, weight loss, and hopefully he'll increase activity level after knee replacement      Relevant Medications   amLODipine (NORVASC) 5 MG tablet    Other Visit Diagnoses    Hypokalemia    -  Primary   noted on recent labs; may be cause of occasional palpitation, likely PVCs; few KCl pills, but short-term since on ACE-I       Follow up plan: Return in about 3 months (around 01/27/2017) for fasting labs and visit for cholesterol and blood pressure with Dr. Sherie DonLada.  An after-visit summary was printed and given to the patient at check-out.  Please see the patient instructions which may contain other information and recommendations beyond what is mentioned above in the assessment and plan.  Meds ordered this encounter  Medications  . amLODipine (NORVASC) 5 MG tablet    Sig: Take 1.5 tablets (7.5 mg total) by mouth daily.    Dispense:  135 tablet    Refill:  3    Changing dose  . potassium chloride (KLOR-CON 10) 10 MEQ tablet    Sig: Take 1 tablet (10 mEq total) by mouth 2 (two) times daily.    Dispense:  4 tablet    Refill:  0    No orders of the defined types were placed in this encounter.

## 2016-10-30 NOTE — Assessment & Plan Note (Signed)
encourgement given to work on weight loss, which should help lipids and pressure

## 2016-10-31 ENCOUNTER — Inpatient Hospital Stay (HOSPITAL_COMMUNITY): Payer: BLUE CROSS/BLUE SHIELD

## 2016-10-31 ENCOUNTER — Inpatient Hospital Stay (HOSPITAL_COMMUNITY): Payer: BLUE CROSS/BLUE SHIELD | Admitting: Vascular Surgery

## 2016-10-31 ENCOUNTER — Encounter (HOSPITAL_COMMUNITY): Payer: Self-pay | Admitting: *Deleted

## 2016-10-31 ENCOUNTER — Inpatient Hospital Stay (HOSPITAL_COMMUNITY): Payer: BLUE CROSS/BLUE SHIELD | Admitting: Certified Registered Nurse Anesthetist

## 2016-10-31 ENCOUNTER — Inpatient Hospital Stay (HOSPITAL_COMMUNITY)
Admission: RE | Admit: 2016-10-31 | Discharge: 2016-11-01 | DRG: 470 | Disposition: A | Discharge status: Home / Self Care | Payer: BLUE CROSS/BLUE SHIELD | Source: Ambulatory Visit | Attending: Orthopedic Surgery | Admitting: Orthopedic Surgery

## 2016-10-31 ENCOUNTER — Encounter (HOSPITAL_COMMUNITY)
Admission: RE | Disposition: A | Discharge status: Home / Self Care | Payer: Self-pay | Source: Ambulatory Visit | Attending: Orthopedic Surgery

## 2016-10-31 DIAGNOSIS — E669 Obesity, unspecified: Secondary | ICD-10-CM | POA: Diagnosis present

## 2016-10-31 DIAGNOSIS — Z96659 Presence of unspecified artificial knee joint: Secondary | ICD-10-CM

## 2016-10-31 DIAGNOSIS — I10 Essential (primary) hypertension: Secondary | ICD-10-CM | POA: Diagnosis present

## 2016-10-31 DIAGNOSIS — Z96651 Presence of right artificial knee joint: Secondary | ICD-10-CM | POA: Diagnosis present

## 2016-10-31 DIAGNOSIS — G4733 Obstructive sleep apnea (adult) (pediatric): Secondary | ICD-10-CM | POA: Diagnosis present

## 2016-10-31 DIAGNOSIS — R9431 Abnormal electrocardiogram [ECG] [EKG]: Secondary | ICD-10-CM | POA: Diagnosis present

## 2016-10-31 DIAGNOSIS — M1712 Unilateral primary osteoarthritis, left knee: Secondary | ICD-10-CM | POA: Diagnosis present

## 2016-10-31 DIAGNOSIS — E785 Hyperlipidemia, unspecified: Secondary | ICD-10-CM | POA: Diagnosis present

## 2016-10-31 DIAGNOSIS — Z6837 Body mass index (BMI) 37.0-37.9, adult: Secondary | ICD-10-CM

## 2016-10-31 DIAGNOSIS — M25562 Pain in left knee: Secondary | ICD-10-CM | POA: Diagnosis present

## 2016-10-31 HISTORY — DX: Dependence on other enabling machines and devices: Z99.89

## 2016-10-31 HISTORY — PX: TOTAL KNEE ARTHROPLASTY: SHX125

## 2016-10-31 HISTORY — DX: Obstructive sleep apnea (adult) (pediatric): G47.33

## 2016-10-31 HISTORY — DX: Unspecified osteoarthritis, unspecified site: M19.90

## 2016-10-31 SURGERY — ARTHROPLASTY, KNEE, TOTAL
Anesthesia: Spinal | Laterality: Left

## 2016-10-31 MED ORDER — MEPERIDINE HCL 25 MG/ML IJ SOLN: 6.2500 mg | INTRAMUSCULAR | Status: DC | PRN

## 2016-10-31 MED ORDER — ALUM & MAG HYDROXIDE-SIMETH 200-200-20 MG/5ML PO SUSP: 30.0000 mL | ORAL | Status: DC | PRN

## 2016-10-31 MED ORDER — SODIUM CHLORIDE 0.9 % IJ SOLN
INTRAMUSCULAR | Status: DC | PRN
  Administered 2016-10-31: 10 mL

## 2016-10-31 MED ORDER — PHENOL 1.4 % MT LIQD: 1.0000 | OROMUCOSAL | Status: DC | PRN

## 2016-10-31 MED ORDER — PROMETHAZINE HCL 25 MG/ML IJ SOLN: 6.2500 mg | INTRAMUSCULAR | Status: DC | PRN

## 2016-10-31 MED ORDER — FENTANYL CITRATE (PF) 100 MCG/2ML IJ SOLN
INTRAMUSCULAR | Status: DC | PRN
Start: 2016-10-31 — End: 2016-10-31
  Administered 2016-10-31: 50 ug via INTRAVENOUS

## 2016-10-31 MED ORDER — ZOLPIDEM TARTRATE 5 MG PO TABS: 5.0000 mg | ORAL_TABLET | Freq: Every evening | ORAL | Status: DC | PRN

## 2016-10-31 MED ORDER — PHENYLEPHRINE HCL 10 MG/ML IJ SOLN
INTRAVENOUS | Status: DC | PRN
  Administered 2016-10-31: 25 ug/min via INTRAVENOUS

## 2016-10-31 MED ORDER — OXYCODONE HCL 5 MG PO TABS
5.0000 mg | ORAL_TABLET | ORAL | Status: DC | PRN
  Administered 2016-10-31 – 2016-11-01 (×2): 10 mg via ORAL
  Filled 2016-10-31 (×2): qty 2

## 2016-10-31 MED ORDER — POTASSIUM CHLORIDE IN NACL 20-0.9 MEQ/L-% IV SOLN
INTRAVENOUS | Status: DC
  Administered 2016-10-31 – 2016-11-01 (×2): via INTRAVENOUS
  Filled 2016-10-31 (×2): qty 1000

## 2016-10-31 MED ORDER — BUPIVACAINE HCL 0.5 % IJ SOLN
INTRAMUSCULAR | Status: DC | PRN
  Administered 2016-10-31: 30 mL

## 2016-10-31 MED ORDER — METOCLOPRAMIDE HCL 5 MG PO TABS: 5.0000 mg | ORAL_TABLET | Freq: Three times a day (TID) | ORAL | Status: DC | PRN

## 2016-10-31 MED ORDER — METHOCARBAMOL 500 MG PO TABS: 500.0000 mg | ORAL_TABLET | Freq: Four times a day (QID) | ORAL | 0 refills | Status: DC

## 2016-10-31 MED ORDER — DIPHENHYDRAMINE HCL 12.5 MG/5ML PO ELIX: 12.5000 mg | ORAL_SOLUTION | ORAL | Status: DC | PRN

## 2016-10-31 MED ORDER — 0.9 % SODIUM CHLORIDE (POUR BTL) OPTIME
TOPICAL | Status: DC | PRN
  Administered 2016-10-31: 1000 mL

## 2016-10-31 MED ORDER — CHLORHEXIDINE GLUCONATE 4 % EX LIQD: 60.0000 mL | Freq: Once | CUTANEOUS | Status: DC

## 2016-10-31 MED ORDER — HYDROMORPHONE HCL 2 MG/ML IJ SOLN
0.5000 mg | INTRAMUSCULAR | Status: DC | PRN
  Administered 2016-10-31 – 2016-11-01 (×2): 1 mg via INTRAVENOUS
  Filled 2016-10-31 (×2): qty 1

## 2016-10-31 MED ORDER — ONDANSETRON HCL 4 MG PO TABS: 4.0000 mg | ORAL_TABLET | Freq: Three times a day (TID) | ORAL | 0 refills | Status: DC | PRN

## 2016-10-31 MED ORDER — ONDANSETRON HCL 4 MG PO TABS: 4.0000 mg | ORAL_TABLET | Freq: Four times a day (QID) | ORAL | Status: DC | PRN

## 2016-10-31 MED ORDER — DEXAMETHASONE SODIUM PHOSPHATE 10 MG/ML IJ SOLN
10.0000 mg | Freq: Once | INTRAMUSCULAR | Status: AC
  Administered 2016-11-01: 10 mg via INTRAVENOUS
  Filled 2016-10-31: qty 1

## 2016-10-31 MED ORDER — MIDAZOLAM HCL 5 MG/5ML IJ SOLN
INTRAMUSCULAR | Status: DC | PRN
  Administered 2016-10-31: 2 mg via INTRAVENOUS

## 2016-10-31 MED ORDER — CEFAZOLIN SODIUM-DEXTROSE 2-4 GM/100ML-% IV SOLN
2.0000 g | Freq: Four times a day (QID) | INTRAVENOUS | Status: AC
  Administered 2016-10-31 (×2): 2 g via INTRAVENOUS
  Filled 2016-10-31 (×2): qty 100

## 2016-10-31 MED ORDER — MIDAZOLAM HCL 2 MG/2ML IJ SOLN
INTRAMUSCULAR | Status: AC
  Filled 2016-10-31: qty 2

## 2016-10-31 MED ORDER — OXYCODONE-ACETAMINOPHEN 5-325 MG PO TABS: 1.0000 | ORAL_TABLET | ORAL | 0 refills | Status: DC | PRN

## 2016-10-31 MED ORDER — BISACODYL 10 MG RE SUPP: 10.0000 mg | Freq: Every day | RECTAL | Status: DC | PRN

## 2016-10-31 MED ORDER — AMLODIPINE BESYLATE 5 MG PO TABS
7.5000 mg | ORAL_TABLET | Freq: Every day | ORAL | Status: DC
  Administered 2016-11-01: 7.5 mg via ORAL
  Filled 2016-10-31: qty 1

## 2016-10-31 MED ORDER — MENTHOL 3 MG MT LOZG: 1.0000 | LOZENGE | OROMUCOSAL | Status: DC | PRN

## 2016-10-31 MED ORDER — BUPIVACAINE LIPOSOME 1.3 % IJ SUSP
20.0000 mL | INTRAMUSCULAR | Status: AC
  Administered 2016-10-31: 20 mL
  Filled 2016-10-31: qty 20

## 2016-10-31 MED ORDER — DOCUSATE SODIUM 100 MG PO CAPS
100.0000 mg | ORAL_CAPSULE | Freq: Two times a day (BID) | ORAL | Status: DC
  Administered 2016-10-31 – 2016-11-01 (×3): 100 mg via ORAL
  Filled 2016-10-31 (×3): qty 1

## 2016-10-31 MED ORDER — POTASSIUM CHLORIDE ER 10 MEQ PO TBCR
10.0000 meq | EXTENDED_RELEASE_TABLET | Freq: Two times a day (BID) | ORAL | Status: DC
  Administered 2016-10-31 – 2016-11-01 (×3): 10 meq via ORAL
  Filled 2016-10-31 (×6): qty 1

## 2016-10-31 MED ORDER — HYDROCHLOROTHIAZIDE 25 MG PO TABS
25.0000 mg | ORAL_TABLET | Freq: Every day | ORAL | Status: DC
  Administered 2016-11-01: 25 mg via ORAL
  Filled 2016-10-31: qty 1

## 2016-10-31 MED ORDER — ACETAMINOPHEN 650 MG RE SUPP: 650.0000 mg | Freq: Four times a day (QID) | RECTAL | Status: DC | PRN

## 2016-10-31 MED ORDER — PROPOFOL 500 MG/50ML IV EMUL
INTRAVENOUS | Status: DC | PRN
  Administered 2016-10-31: 25 ug/kg/min via INTRAVENOUS

## 2016-10-31 MED ORDER — ONDANSETRON HCL 4 MG/2ML IJ SOLN: 4.0000 mg | Freq: Four times a day (QID) | INTRAMUSCULAR | Status: DC | PRN

## 2016-10-31 MED ORDER — POLYETHYLENE GLYCOL 3350 17 G PO PACK
17.0000 g | PACK | Freq: Every day | ORAL | Status: DC | PRN
Start: 2016-10-31 — End: 2016-11-01

## 2016-10-31 MED ORDER — LOSARTAN POTASSIUM 50 MG PO TABS
100.0000 mg | ORAL_TABLET | Freq: Every day | ORAL | Status: DC
  Administered 2016-11-01: 100 mg via ORAL
  Filled 2016-10-31: qty 2

## 2016-10-31 MED ORDER — METHOCARBAMOL 1000 MG/10ML IJ SOLN: 500.0000 mg | Freq: Four times a day (QID) | INTRAVENOUS | Status: DC | PRN

## 2016-10-31 MED ORDER — ASPIRIN EC 325 MG PO TBEC
325.0000 mg | DELAYED_RELEASE_TABLET | Freq: Every day | ORAL | Status: DC
  Administered 2016-11-01: 325 mg via ORAL
  Filled 2016-10-31: qty 1

## 2016-10-31 MED ORDER — LOSARTAN POTASSIUM-HCTZ 100-25 MG PO TABS: 1.0000 | ORAL_TABLET | Freq: Every day | ORAL | Status: DC

## 2016-10-31 MED ORDER — ONDANSETRON HCL 4 MG/2ML IJ SOLN
INTRAMUSCULAR | Status: DC | PRN
  Administered 2016-10-31: 4 mg via INTRAVENOUS

## 2016-10-31 MED ORDER — HYDROMORPHONE HCL 1 MG/ML IJ SOLN: 0.2500 mg | INTRAMUSCULAR | Status: DC | PRN

## 2016-10-31 MED ORDER — METHOCARBAMOL 500 MG PO TABS
500.0000 mg | ORAL_TABLET | Freq: Four times a day (QID) | ORAL | Status: DC | PRN
  Administered 2016-10-31: 500 mg via ORAL
  Filled 2016-10-31: qty 1

## 2016-10-31 MED ORDER — SODIUM CHLORIDE 0.9 % IR SOLN
Status: DC | PRN
  Administered 2016-10-31: 3000 mL

## 2016-10-31 MED ORDER — ACETAMINOPHEN 325 MG PO TABS: 650.0000 mg | ORAL_TABLET | Freq: Four times a day (QID) | ORAL | Status: DC | PRN

## 2016-10-31 MED ORDER — CELECOXIB 200 MG PO CAPS
200.0000 mg | ORAL_CAPSULE | Freq: Two times a day (BID) | ORAL | Status: DC
  Administered 2016-10-31 – 2016-11-01 (×3): 200 mg via ORAL
  Filled 2016-10-31 (×3): qty 1

## 2016-10-31 MED ORDER — ASPIRIN EC 325 MG PO TBEC: 325.0000 mg | DELAYED_RELEASE_TABLET | Freq: Every day | ORAL | 0 refills | Status: DC

## 2016-10-31 MED ORDER — METOCLOPRAMIDE HCL 5 MG/ML IJ SOLN: 5.0000 mg | Freq: Three times a day (TID) | INTRAMUSCULAR | Status: DC | PRN

## 2016-10-31 MED ORDER — BUPIVACAINE HCL (PF) 0.5 % IJ SOLN
INTRAMUSCULAR | Status: AC
Start: 2016-10-31 — End: 2016-10-31
  Filled 2016-10-31: qty 30

## 2016-10-31 MED ORDER — BUPIVACAINE HCL (PF) 0.5 % IJ SOLN
INTRAMUSCULAR | Status: AC
  Filled 2016-10-31: qty 30

## 2016-10-31 MED ORDER — PROPOFOL 10 MG/ML IV BOLUS
INTRAVENOUS | Status: AC
  Filled 2016-10-31: qty 20

## 2016-10-31 MED ORDER — MAGNESIUM CITRATE PO SOLN: 1.0000 | Freq: Once | ORAL | Status: DC | PRN

## 2016-10-31 MED ORDER — FENTANYL CITRATE (PF) 100 MCG/2ML IJ SOLN
INTRAMUSCULAR | Status: AC
  Filled 2016-10-31: qty 2

## 2016-10-31 SURGICAL SUPPLY — 66 items
BANDAGE ACE 4X5 VEL STRL LF (GAUZE/BANDAGES/DRESSINGS) ×3 IMPLANT
BANDAGE ACE 6X5 VEL STRL LF (GAUZE/BANDAGES/DRESSINGS) ×3 IMPLANT
BANDAGE ESMARK 6X9 LF (GAUZE/BANDAGES/DRESSINGS) ×1 IMPLANT
BENZOIN TINCTURE PRP APPL 2/3 (GAUZE/BANDAGES/DRESSINGS) ×3 IMPLANT
BLADE SAG 18X100X1.27 (BLADE) ×6 IMPLANT
BNDG ESMARK 6X9 LF (GAUZE/BANDAGES/DRESSINGS) ×3
BOWL SMART MIX CTS (DISPOSABLE) ×3 IMPLANT
CAPT KNEE TOTAL 3 ×3 IMPLANT
CEMENT BONE SIMPLEX SPEEDSET (Cement) ×6 IMPLANT
CLOSURE STERI-STRIP 1/2X4 (GAUZE/BANDAGES/DRESSINGS) ×1
CLOSURE WOUND 1/2 X4 (GAUZE/BANDAGES/DRESSINGS) ×2
CLSR STERI-STRIP ANTIMIC 1/2X4 (GAUZE/BANDAGES/DRESSINGS) ×2 IMPLANT
COVER SURGICAL LIGHT HANDLE (MISCELLANEOUS) ×3 IMPLANT
CUFF TOURNIQUET SINGLE 34IN LL (TOURNIQUET CUFF) ×3 IMPLANT
DRAPE HALF SHEET 40X57 (DRAPES) ×3 IMPLANT
DRAPE IMP U-DRAPE 54X76 (DRAPES) ×3 IMPLANT
DRAPE PROXIMA HALF (DRAPES) ×3 IMPLANT
DRAPE U-SHAPE 47X51 STRL (DRAPES) ×3 IMPLANT
DRESSING AQUACEL AG SP 3.5X10 (GAUZE/BANDAGES/DRESSINGS) ×1 IMPLANT
DRSG AQUACEL AG ADV 3.5X10 (GAUZE/BANDAGES/DRESSINGS) ×3 IMPLANT
DRSG AQUACEL AG SP 3.5X10 (GAUZE/BANDAGES/DRESSINGS) ×3
DURAPREP 26ML APPLICATOR (WOUND CARE) ×6 IMPLANT
ELECT CAUTERY BLADE 6.4 (BLADE) ×3 IMPLANT
ELECT REM PT RETURN 9FT ADLT (ELECTROSURGICAL) ×3
ELECTRODE REM PT RTRN 9FT ADLT (ELECTROSURGICAL) ×1 IMPLANT
EVACUATOR 1/8 PVC DRAIN (DRAIN) IMPLANT
FACESHIELD WRAPAROUND (MASK) ×6 IMPLANT
GLOVE BIOGEL PI IND STRL 7.0 (GLOVE) ×1 IMPLANT
GLOVE BIOGEL PI INDICATOR 7.0 (GLOVE) ×2
GLOVE ORTHO TXT STRL SZ7.5 (GLOVE) ×3 IMPLANT
GLOVE SURG ORTHO 7.0 STRL STRW (GLOVE) ×3 IMPLANT
GOWN STRL REUS W/ TWL LRG LVL3 (GOWN DISPOSABLE) ×2 IMPLANT
GOWN STRL REUS W/ TWL XL LVL3 (GOWN DISPOSABLE) ×1 IMPLANT
GOWN STRL REUS W/TWL LRG LVL3 (GOWN DISPOSABLE) ×4
GOWN STRL REUS W/TWL XL LVL3 (GOWN DISPOSABLE) ×2
HANDPIECE INTERPULSE COAX TIP (DISPOSABLE) ×2
IMMOBILIZER KNEE 22 (SOFTGOODS) ×3 IMPLANT
IMMOBILIZER KNEE 22 UNIV (SOFTGOODS) ×3 IMPLANT
IMMOBILIZER KNEE 24 THIGH 36 (MISCELLANEOUS) IMPLANT
IMMOBILIZER KNEE 24 UNIV (MISCELLANEOUS)
KIT BASIN OR (CUSTOM PROCEDURE TRAY) ×3 IMPLANT
KIT ROOM TURNOVER OR (KITS) ×3 IMPLANT
MANIFOLD NEPTUNE II (INSTRUMENTS) ×3 IMPLANT
NEEDLE 18GX1X1/2 (RX/OR ONLY) (NEEDLE) ×3 IMPLANT
NEEDLE HYPO 25GX1X1/2 BEV (NEEDLE) ×3 IMPLANT
NS IRRIG 1000ML POUR BTL (IV SOLUTION) ×3 IMPLANT
PACK TOTAL JOINT (CUSTOM PROCEDURE TRAY) ×3 IMPLANT
PACK UNIVERSAL I (CUSTOM PROCEDURE TRAY) ×3 IMPLANT
PAD ARMBOARD 7.5X6 YLW CONV (MISCELLANEOUS) ×6 IMPLANT
SET HNDPC FAN SPRY TIP SCT (DISPOSABLE) ×1 IMPLANT
STRIP CLOSURE SKIN 1/2X4 (GAUZE/BANDAGES/DRESSINGS) ×4 IMPLANT
SUCTION FRAZIER HANDLE 10FR (MISCELLANEOUS) ×2
SUCTION TUBE FRAZIER 10FR DISP (MISCELLANEOUS) ×1 IMPLANT
SUT MNCRL AB 4-0 PS2 18 (SUTURE) ×3 IMPLANT
SUT VIC AB 0 CT1 27 (SUTURE)
SUT VIC AB 0 CT1 27XBRD ANBCTR (SUTURE) IMPLANT
SUT VIC AB 1 CTX 36 (SUTURE) ×2
SUT VIC AB 1 CTX36XBRD ANBCTR (SUTURE) ×1 IMPLANT
SUT VIC AB 2-0 CT1 27 (SUTURE) ×4
SUT VIC AB 2-0 CT1 TAPERPNT 27 (SUTURE) ×2 IMPLANT
SYR 50ML LL SCALE MARK (SYRINGE) ×3 IMPLANT
SYR CONTROL 10ML LL (SYRINGE) ×3 IMPLANT
TOWEL OR 17X24 6PK STRL BLUE (TOWEL DISPOSABLE) ×3 IMPLANT
TOWEL OR 17X26 10 PK STRL BLUE (TOWEL DISPOSABLE) ×3 IMPLANT
TRAY CATH 16FR W/PLASTIC CATH (SET/KITS/TRAYS/PACK) IMPLANT
WATER STERILE IRR 1000ML POUR (IV SOLUTION) ×3 IMPLANT

## 2016-10-31 NOTE — Progress Notes (Signed)
Orthopedic Tech Progress Note Patient Details:  Warren [name] [place] 01/05/1951 [id number]  CPM Left Knee CPM Left Knee: On Left Knee Flexion (Degrees): 90 Left Knee Extension (Degrees): 0 Additional Comments: trapeze bar patient helper   Nikki DomCrawford, Anis Cinelli 10/31/2016, 11:54 AM Viewed order from doctor's order list

## 2016-10-31 NOTE — Interval H&P Note (Signed)
History and Physical Interval Note:  10/31/2016 8:33 AM  Warren [name] [place]  has presented today for surgery, with the diagnosis of djd left knee  The various methods of treatment have been discussed with the patient and family. After consideration of risks, benefits and other options for treatment, the patient has consented to  Procedure(s): TOTAL KNEE ARTHROPLASTY (Left) as a surgical intervention .  The patient's history has been reviewed, patient examined, no change in status, stable for surgery.  I have reviewed the patient's chart and labs.  Questions were answered to the patient's satisfaction.     Loreta Aveaniel F Kerrie Timm

## 2016-10-31 NOTE — Discharge Summary (Addendum)
Patient ID: Warren [name] MRN: [id number] DOB/AGE: [birth year] 64 y.o.  Admit date: 10/31/2016 Discharge date: 11/01/2016  Admission Diagnoses:  Principal Problem:   Primary localized osteoarthritis of left knee Active Problems:   Benign essential HTN   Obesity   Obstructive sleep apnea of adult   Hyperlipidemia   Abnormal EKG   Discharge Diagnoses:  Same  Past Medical History:  Diagnosis Date  . Erectile dysfunction 02/27/2016  . Hypercholesterolemia without hypertriglyceridemia 10/08/2007  . Hypertension   . Malignant hyperthermia 1986   "[facility] I [facility]"  . Obesity 02/24/2015  . OSA on CPAP 02/24/2015   CPAP q night   . Osteoarthritis    "feet and knees"  (10/31/2016)    Surgeries: Procedure(s): TOTAL KNEE ARTHROPLASTY on 10/31/2016   Consultants:   Discharged Condition: Improved  Hospital Course: Warren [name] is an 66 y.o. male who was admitted 10/31/2016 for operative treatment ofPrimary localized osteoarthritis of left knee. Patient has severe unremitting pain that affects sleep, daily activities, and work/hobbies. After pre-op clearance the patient was taken to the operating room on 10/31/2016 and underwent  Procedure(s): TOTAL KNEE ARTHROPLASTY.    Patient was given perioperative antibiotics:  Anti-infectives    Start     Dose/Rate Route Frequency Ordered Stop   10/31/16 1500  ceFAZolin (ANCEF) IVPB 2g/100 mL premix     2 g 200 mL/hr over 30 Minutes Intravenous Every 6 hours 10/31/16 1223 10/31/16 2159   10/31/16 0800  ceFAZolin (ANCEF) 3 g in dextrose 5 % 50 mL IVPB     3 g 130 mL/hr over 30 Minutes Intravenous To ShortStay Surgical 10/30/16 1406 10/31/16 0847       Patient was given sequential compression devices, early ambulation, and chemoprophylaxis to prevent DVT.  Patient benefited maximally from hospital stay and there were no complications.    Recent vital signs:  Patient Vitals for the past 24  hrs:  BP Temp Temp src Pulse Resp SpO2 Height Weight  11/01/16 0534 (!) 154/96 99.6 F (37.6 C) Oral 82 18 98 % - -  10/31/16 2139 (!) 142/72 97.7 F (36.5 C) Oral 69 18 100 % - -  10/31/16 1700 - - - - - - - 124.3 kg (274 lb 0.5 oz)  10/31/16 1535 (!) 93/51 98.4 F (36.9 C) Oral (!) 54 18 98 % - -  10/31/16 1523 - - - - - - 6\' 2"  (1.88 m) -  10/31/16 1510 (!) 86/49 - - (!) 57 - - - -  10/31/16 1505 (!) 80/44 - - - - - - -  10/31/16 1430 128/73 97.3 F (36.3 C) Oral 70 16 100 % - -  10/31/16 1223 140/79 98.2 F (36.8 C) Oral (!) 52 16 100 % - -  10/31/16 1200 129/76 97.8 F (36.6 C) - (!) 50 13 99 % - -  10/31/16 1150 118/74 - - (!) 52 17 100 % - -  10/31/16 1130 104/84 - - (!) 53 16 100 % - -  10/31/16 1115 101/72 - - (!) 56 12 99 % - -  10/31/16 1103 98/69 - - (!) 57 17 96 % - -  10/31/16 1100 (!) 79/50 - - 63 17 99 % - -  10/31/16 1047 96/63 - - 62 (!) 21 98 % - -  10/31/16 1044 - 97.3 F (36.3 C) - 71 13 98 % - -  10/31/16 0722 (!) 154/85 97.9 F (36.6  C) Oral 64 20 99 % - -     Recent laboratory studies: No results for input(s): WBC, HGB, HCT, PLT, NA, K, CL, CO2, BUN, CREATININE, GLUCOSE, INR, CALCIUM in the last 72 hours.  Invalid input(s): PT, 2   Discharge Medications:   Allergies as of 11/01/2016   No Known Allergies     Medication List    TAKE these medications   amLODipine 5 MG tablet Commonly known as:  NORVASC Take 1.5 tablets (7.5 mg total) by mouth daily.   aspirin EC 325 MG tablet Take 1 tablet (325 mg total) by mouth daily. What changed:  medication strength  how much to take   losartan-hydrochlorothiazide 100-25 MG tablet Commonly known as:  HYZAAR Take 1 tablet by mouth daily.   methocarbamol 500 MG tablet Commonly known as:  ROBAXIN Take 1 tablet (500 mg total) by mouth 4 (four) times daily.   ondansetron 4 MG tablet Commonly known as:  ZOFRAN Take 1 tablet (4 mg total) by mouth every 8 (eight) hours as needed for nausea or  vomiting.   oxyCODONE-acetaminophen 5-325 MG tablet Commonly known as:  PERCOCET Take 1-2 tablets by mouth every 4 (four) hours as needed for severe pain.   potassium chloride 10 MEQ tablet Commonly known as:  KLOR-CON 10 Take 1 tablet (10 mEq total) by mouth 2 (two) times daily.   tadalafil 20 MG tablet Commonly known as:  CIALIS One by mouth every two days if needed prior for intimacy (only for intermittent use) What changed:  how much to take  how to take this  when to take this  reasons to take this  additional instructions       Diagnostic Studies: Dg Knee Left Port  Result Date: 10/31/2016 CLINICAL DATA:  66 year old male status post left knee replacement. Initial encounter. EXAM: PORTABLE LEFT KNEE - 1-2 VIEW COMPARISON:  None. FINDINGS: Portable AP and cross-table lateral views. Sequelae of left total knee arthroplasty. Hardware appears intact and normally aligned. Postoperative changes to the surrounding soft tissues including gas within the joint space. No unexpected osseous changes identified. IMPRESSION: Left total knee arthroplasty with no adverse features. Electronically Signed   By: Odessa Fleming M.D.   On: 10/31/2016 11:55    Disposition: 06-Home-Health Care Svc    Follow-up Information    Loreta Ave, MD. Schedule an appointment as soon as possible for a visit in 2 week(s).   Specialty:  Orthopedic Surgery Contact information: [address] [place] [address] [phone]            Signed: Otilio Saber 11/01/2016, 7:13 AM

## 2016-10-31 NOTE — Evaluation (Addendum)
Physical Therapy Evaluation Patient Details Name: Warren [name] [place] MRN: [id number] DOB: 20-Apr-1951 Today's Date: 10/31/2016   History of Present Illness  66 y.o. male admitted  for elective [name] TKA.  Pt with significant PMhx of RKTA, obesity, HTN, and bil foot surgery (for hammer toes).   Clinical Impression   Pt is s/p TKA resulting in the deficits listed below (see PT Problem List). Overall moving very well with nice, stable [name] knee in stance; activity tolerance limited by hypotension; RN aware Pt will benefit from skilled PT to increase their independence and safety with mobility to allow discharge to the venue listed below.     10/31/16 1505 10/31/16 1510  Vital Signs  Pulse Rate (!) 51 (!) 57  BP (!) 80/44 (post ambulation and upright activity) (!) 86/49  Patient Position (if appropriate) Sitting (Feet up in recliner; post ambulation) Sitting (Feet up in recliner; RN notified of low BPs)       Follow Up Recommendations Home health PT    Equipment Recommendations  None recommended by PT    Recommendations for Other Services       Precautions / Restrictions Precautions Precautions: Knee Precaution Booklet Issued: Yes (comment) Precaution Comments: Pt educated to not allow any pillow or bolster under knee for healing with optimal range of motion.  Restrictions Weight Bearing Restrictions: No      Mobility  Bed Mobility Overal bed mobility: Modified Independent             General bed mobility comments: Used rails; no difficulty  Transfers Overall transfer level: Needs assistance Equipment used: Rolling walker (2 wheeled) Transfers: Sit to/from Stand Sit to Stand: Supervision         General transfer comment: Cues to self-monitor for activity tolerance  Ambulation/Gait Ambulation/Gait assistance: Supervision Ambulation Distance (Feet): 150 Feet Assistive device: Rolling walker (2 wheeled) Gait Pattern/deviations: Step-through pattern Gait velocity:  approaching WNL   General Gait Details: Cues to self-monitor for knee stability in stance and for activity tolerance  Stairs Stairs: Yes Stairs assistance: Supervision Stair Management: No rails;With walker;Step to pattern;Forwards;Backwards Number of Stairs: 1 General stair comments: x2  Wheelchair Mobility    Modified Rankin (Stroke Patients Only)       Balance Overall balance assessment: No apparent balance deficits (not formally assessed)                                           Pertinent Vitals/Pain Pain Assessment: 0-10 Pain Score: 2  Pain Location: [name] knee Pain Descriptors / Indicators: Aching Pain Intervention(s): Monitored during session    Home Living Family/patient expects to be discharged to:: Private residence Living Arrangements: Spouse/significant other Available Help at Discharge: Family;Available PRN/intermittently Type of Home: House Home Access: Level entry;Other (comment) (1 small step up into the kitchen)     Home Layout: One level Home Equipment: Grab bars - tub/shower;Hand held shower head;Crutches      Prior Function Level of Independence: Independent         Comments: 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     Hand Dominance   Dominant Hand: Left    Extremity/Trunk Assessment   Upper Extremity Assessment Upper Extremity Assessment: Overall WFL for tasks assessed    Lower Extremity Assessment Lower Extremity Assessment: RLE deficits/detail RLE Deficits / Details: Very nice strength, with good knee extension control; some flexion  deficits, though minimal       Communication   Communication: No difficulties  Cognition Arousal/Alertness: Awake/alert Behavior During Therapy: WFL for tasks assessed/performed Overall Cognitive Status: Within Functional Limits for tasks assessed                      General Comments General comments (skin integrity, edema, etc.): Pt reported some  lightheadedness towards the end of his walk; took BPs above      Exercises Total Joint Exercises Quad Sets: AROM;Left;10 reps Heel Slides: AROM;Left;10 reps Straight Leg Raises: AROM;Left;10 reps   Assessment/Plan    PT Assessment Patient needs continued PT services  PT Problem List Decreased strength;Decreased range of motion;Cardiopulmonary status limiting activity;Decreased activity tolerance;Decreased mobility;Pain       PT Treatment Interventions DME instruction;Gait training;Functional mobility training;Therapeutic activities;Therapeutic exercise;Patient/family education    PT Goals (Current goals can be found in the Care Plan section)  Acute Rehab PT Goals Patient Stated Goal: Did not state PT Goal Formulation: With patient Time For Goal Achievement: 11/07/16 Potential to Achieve Goals: Good    Frequency 7X/week   Barriers to discharge        Co-evaluation               End of Session Equipment Utilized During Treatment: Gait belt Activity Tolerance: Other (comment) (Hypotensive post amb) Patient left: in chair;with call bell/phone within reach (feet up) Nurse Communication: Mobility status (Hypotensive) PT Visit Diagnosis: Pain Pain - Right/Left: Left Pain - part of body: Knee         Time: 1440-1510 PT Time Calculation (min) (ACUTE ONLY): 30 min   Charges:   PT Evaluation $PT Eval Low Complexity: 1 Procedure PT Treatments $Gait Training: 8-22 mins   PT G CodesLevi [name]:         Warren [name] 10/31/2016, 4:49 PM  Warren ClinesHolly Geddy [name], PT  Acute Rehabilitation Services Pager [phone] Office [phone]

## 2016-10-31 NOTE — Progress Notes (Signed)
Pt received from PACU with no noted distress. Pt stable, neuro intact. Surgical compression wrap noted to left leg, CPM on and aligned. Pt denies pain or discomfort. Pt oriented to room. Safety measures in place. Call bell within reach. Will continue to monitor.

## 2016-10-31 NOTE — Transfer of Care (Signed)
Immediate Anesthesia Transfer of Care Note  Patient: Warren [name]  Procedure(s) Performed: Procedure(s): TOTAL KNEE ARTHROPLASTY (Left)  Patient Location: PACU  Anesthesia Type:MAC and Spinal  Level of Consciousness: awake, alert  and oriented  Airway & Oxygen Therapy: Patient Spontanous Breathing and Patient connected to nasal cannula oxygen  Post-op Assessment: Report given to RN and Post -op Vital signs reviewed and stable  Post vital signs: Reviewed and stable  Last Vitals:  Vitals:   10/31/16 0722  BP: (!) 154/85  Pulse: 64  Resp: 20  Temp: 36.6 C    Last Pain:  Vitals:   10/31/16 0722  TempSrc: Oral      Patients Stated Pain Goal: 2 (38/88/75 7972)  Complications: No apparent anesthesia complications

## 2016-10-31 NOTE — Anesthesia Procedure Notes (Signed)
Spinal  Patient location during procedure: OR Staffing Anesthesiologist: Mikell Kazlauskas Performed: anesthesiologist  Preanesthetic Checklist Completed: patient identified, site marked, surgical consent, pre-op evaluation, timeout performed, IV checked, risks and benefits discussed and monitors and equipment checked Spinal Block Patient position: sitting Prep: ChloraPrep and site prepped and draped Patient monitoring: heart rate, continuous pulse ox and blood pressure Location: L3-4 Injection technique: single-shot Needle Needle type: Sprotte  Needle gauge: 24 G Needle length: 9 cm Additional Notes Expiration date of kit checked and confirmed. Patient tolerated procedure well, without complications.       

## 2016-10-31 NOTE — Anesthesia Preprocedure Evaluation (Addendum)
Anesthesia Evaluation  Patient identified by MRN, date of birth, ID band Patient awake    Reviewed: Allergy & Precautions, NPO status , Patient's Chart, lab work & pertinent test results  History of Anesthesia Complications (+) MALIGNANT HYPERTHERMIA and history of anesthetic complications (Muscle biopsy tested positive in 1980's at [facility].)  Airway Mallampati: I  TM Distance: >3 FB Neck ROM: Full    Dental  (+) Missing   Pulmonary neg pulmonary ROS, sleep apnea ,    Pulmonary exam normal breath sounds clear to auscultation       Cardiovascular hypertension, Pt. on medications negative cardio ROS Normal cardiovascular exam Rhythm:Regular Rate:Normal     Neuro/Psych negative neurological ROS  negative psych ROS   GI/Hepatic negative GI ROS, Neg liver ROS,   Endo/Other  negative endocrine ROS  Renal/GU negative Renal ROS     Musculoskeletal negative musculoskeletal ROS (+)   Abdominal   Peds  Hematology negative hematology ROS (+)   Anesthesia Other Findings   Reproductive/Obstetrics negative OB ROS                            Anesthesia Physical  Anesthesia Plan  ASA: II  Anesthesia Plan: Spinal   Post-op Pain Management:    Induction:   Airway Management Planned: Simple Face Mask  Additional Equipment:   Intra-op Plan:   Post-operative Plan:   Informed Consent: I have reviewed the patients History and Physical, chart, labs and discussed the procedure including the risks, benefits and alternatives for the proposed anesthesia with the patient or authorized representative who has indicated his/her understanding and acceptance.   Dental advisory given  Plan Discussed with: CRNA  Anesthesia Plan Comments: (Legitimate documented history of Malignant hyperthermia. Non triggering anesthetics only.  SAB today )       Anesthesia Quick Evaluation

## 2016-10-31 NOTE — Discharge Instructions (Signed)

## 2016-11-01 ENCOUNTER — Encounter (HOSPITAL_COMMUNITY): Payer: Self-pay | Admitting: Orthopedic Surgery

## 2016-11-01 LAB — BASIC METABOLIC PANEL
Anion gap: 8 (ref 5–15)
BUN: 10 mg/dL (ref 6–20)
CALCIUM: 8.8 mg/dL — AB (ref 8.9–10.3)
CO2: 28 mmol/L (ref 22–32)
CREATININE: 1 mg/dL (ref 0.61–1.24)
Chloride: 102 mmol/L (ref 101–111)
GFR calc Af Amer: >60 mL/min (ref >60)
GFR calc non Af Amer: >60 mL/min (ref >60)
Glucose, Bld: 105 mg/dL — ABNORMAL HIGH (ref 65–99)
Potassium: 3.8 mmol/L (ref 3.5–5.1)
Sodium: 138 mmol/L (ref 135–145)

## 2016-11-01 LAB — CBC
HEMATOCRIT: 39.4 % (ref 39.0–52.0)
Hemoglobin: 13.1 g/dL (ref 13.0–17.0)
MCH: 29.6 pg (ref 26.0–34.0)
MCHC: 33.2 g/dL (ref 30.0–36.0)
MCV: 89.1 fL (ref 78.0–100.0)
PLATELETS: 235 10*3/uL (ref 150–400)
RBC: 4.42 MIL/uL (ref 4.22–5.81)
RDW: 13 % (ref 11.5–15.5)
WBC: 9.8 10*3/uL (ref 4.0–10.5)

## 2016-11-01 NOTE — Care Management Note (Signed)
Case Management Note  Patient Details  Name: Tito L [place] MRN: [id number] Date of Birth: 08/06/1951  Subjective/Objective:  66 y.o. M s/p/ L TKA. To be discharged today with spouse to home. Will be attending Outpt PT at [facility]. No CM needs. Has RW and 3n1 at home.                   Action/Plan: No CM needs at present.    Expected Discharge Date:  11/01/16               Expected Discharge Plan:  Home/Self Care  In-House Referral:  NA  Discharge planning Services  CM Consult  Post Acute Care Choice:  NA Choice offered to:  Patient, Spouse  DME Arranged:   (has RW and 3n1) DME Agency:  NA  HH Arranged:   (will be going to [facility]) HH Agency:  NA  Status of Service:  Completed, signed off  If discussed at Long Length of Stay Meetings, dates discussed:    Additional Comments:  Yvone NeuCrutchfield, Favor Hackler M, RN 11/01/2016, 10:49 AM

## 2016-11-01 NOTE — Progress Notes (Signed)
Discharge instructions given. Pt verbalized understanding and all questions were answered.  

## 2016-11-01 NOTE — Progress Notes (Signed)
Physical Therapy Treatment Patient Details Name: Warren [name] MRN: [id number] DOB: 07-28-1951 Today's Date: 11/01/2016    History of Present Illness 66 y.o. male admitted  for elective L TKA.  Pt with significant PMhx of RKTA, obesity, HTN, and bil foot surgery (for hammer toes).     PT Comments    Pt is POD 1 and moving well with therapy. Pt is able to ambulate with RW with supervision progressing toward Mod I. Improved sequencing and cadence noted with increased knee flexion LLE with gait noted through swing phase. Pt is knowledgeable of exercises and gait sequencing.     Follow Up Recommendations  Outpatient PT     Equipment Recommendations  None recommended by PT    Recommendations for Other Services       Precautions / Restrictions Precautions Precautions: Knee Precaution Booklet Issued: Yes (comment) Precaution Comments: Reviewed precautions, pt aware Restrictions Weight Bearing Restrictions: Yes LLE Weight Bearing: Weight bearing as tolerated    Mobility  Bed Mobility               General bed mobility comments: OOB in recliner when PT arrives  Transfers   Equipment used: Rolling walker (2 wheeled) Transfers: Sit to/from Stand Sit to Stand: Supervision         General transfer comment: Cues to self-monitor for activity tolerance  Ambulation/Gait Ambulation/Gait assistance: Supervision Ambulation Distance (Feet): 500 Feet Assistive device: Rolling walker (2 wheeled) Gait Pattern/deviations: Step-through pattern Gait velocity: approaching WNL   General Gait Details: Cues to self-monitor for knee stability in stance and for activity tolerance   Stairs            Wheelchair Mobility    Modified Rankin (Stroke Patients Only)       Balance Overall balance assessment: No apparent balance deficits (not formally assessed)                                  Cognition Arousal/Alertness: Awake/alert Behavior During  Therapy: WFL for tasks assessed/performed Overall Cognitive Status: Within Functional Limits for tasks assessed                      Exercises Total Joint Exercises Heel Slides: AROM;Left;10 reps;Seated Long Arc Quad: AROM;Left;10 reps;Seated Goniometric ROM: 0-90    General Comments        Pertinent Vitals/Pain Pain Assessment: 0-10 Pain Score: 3  Pain Location: L knee Pain Descriptors / Indicators: Aching Pain Intervention(s): Monitored during session;Premedicated before session;Ice applied    Home Living                      Prior Function            PT Goals (current goals can now be found in the care plan section) Acute Rehab PT Goals Patient Stated Goal: To get home today Progress towards PT goals: Progressing toward goals    Frequency    7X/week      PT Plan Current plan remains appropriate    Co-evaluation             End of Session Equipment Utilized During Treatment: Gait belt Activity Tolerance: Patient tolerated treatment well Patient left: in chair;with call bell/phone within reach Nurse Communication: Mobility status PT Visit Diagnosis: Pain Pain - Right/Left: Left Pain - part of body: Knee     Time: [id number] PT Time Calculation (min) (ACUTE ONLY):  27 min  Charges:  $Gait Training: 23-37 mins                    G Codes:       Colin Broach PT, DPT  [phone]  11/01/2016, 9:28 AM

## 2016-11-01 NOTE — Op Note (Signed)
NAME:  [place], [place]                ACCOUNT NO.:  [id number]  MEDICAL RECORD NO.:  [id number]  LOCATION:                                 FACILITY:  PHYSICIAN:  Loreta Aveaniel F. Andrey Hoobler, M.D. DATE OF BIRTH:  09/21/1950  DATE OF PROCEDURE:  10/31/2016 DATE OF DISCHARGE:                              OPERATIVE REPORT   PREOPERATIVE DIAGNOSIS:  Left knee end-stage arthritis, primary generalized.  POSTOPERATIVE DIAGNOSIS:  Left knee end-stage arthritis, primary generalized.  PROCEDURE:  Left knee modified minimally-invasive total knee replacement with Stryker triathlon prosthesis.  A cemented pegged posterior stabilized #7 femoral component.  Cemented #7 tibial component, 9-mm PS insert.  Cemented resurfacing 38-mm patellar component.  SURGEON:  Loreta Aveaniel F. Rahel Carlton, M.D.  ASSISTANT:  Mikey KirschnerLindsey Stanberry, PA, present throughout the entire case and necessary for timely completion of procedure.  ANESTHESIA:  Spinal.  SPECIMENS:  None.  CULTURES:  None.  COMPLICATIONS:  None.  DRESSINGS:  Soft compressive knee immobilizer.  TOURNIQUET TIME:  1 hour.  DESCRIPTION OF PROCEDURE:  The patient was brought to the operating room and after adequate anesthesia had been obtained, tourniquet applied, prepped and draped in usual sterile fashion.  Exsanguinated with elevation of Esmarch.  Tourniquet inflated to 350 mmHg.  Longitudinal incision above the patella down to tibial tubercle.  Medial arthrotomy, vastus splitting.  Flexible intramedullary guide, distal femur.  8 mm resection, 5 degrees of valgus.  Femur was then sized, cut and fitted for a posterior stabilized pegged #7 component using epicondylar axis. Proximal tibial resection with extramedullary guide.  Size #7 component rotation set with trials, hand reamed.  Patella exposed.  Posterior 10 mm removed.  Drilled, sized, and fitted for a 38-mm component.  With trials in place, very pleased with mechanical axis, full motion, good stability,  good patellar tracking.  All trials were removed.  Copious irrigation with a pulse irrigating device.  Cement prepared, placed on all components, firmly seated.  Polyethylene attached to tibia, knee reduced.  Patella held with a clamp.  Once the cement hardened, the knee was irrigated again.  Injected with Exparel.  Arthrotomy closed with #1 Vicryl in the subcutaneous, subcuticular closure.  Sterile compressive dressing applied.  Tourniquet deflated and removed.  Knee immobilizer applied.  Anesthesia reversed.  Brought to the recovery room.  Tolerated the surgery well.  No complications.     Loreta Aveaniel F. Yumalay Circle, M.D.   ______________________________ Loreta Aveaniel F. Julliana Whitmyer, M.D.    DFM/MEDQ  D:  10/31/2016  T:  10/31/2016  Job:  [id number]

## 2016-11-01 NOTE — Progress Notes (Signed)
Orthopedic Tech Progress Note Patient Details:  [name] 01-07-1951 [id number]  Patient ID: Warren [name], male   DOB: 01-07-1951, 66 y.o.   MRN: [id number] Pt has not to long ago come out of cpm. Will call when ready to go back in.  Trinna PostMartinez, Denario Bagot J 11/01/2016, 6:33 AM

## 2016-11-01 NOTE — Progress Notes (Signed)
Subjective: 1 Day Post-Op Procedure(s) (LRB): TOTAL KNEE ARTHROPLASTY (Left) Patient reports pain as mild.    Objective: Vital signs in last 24 hours: Temp:  [97.3 F (36.3 C)-99.6 F (37.6 C)] 99.6 F (37.6 C) (02/22 0534) Pulse Rate:  [50-82] 82 (02/22 0534) Resp:  [12-21] 18 (02/22 0534) BP: (79-154)/(44-96) 154/96 (02/22 0534) SpO2:  [96 %-100 %] 98 % (02/22 0534) Weight:  [124.3 kg (274 lb 0.5 oz)] 124.3 kg (274 lb 0.5 oz) (02/21 1700)  Intake/Output from previous day: 02/21 0701 - 02/22 0700 In: 1956.7 [P.O.:615; I.V.:1241.7; IV Piggyback:100] Out: 850 [Urine:850] Intake/Output this shift: No intake/output data recorded.  No results for input(s): HGB in the last 72 hours. No results for input(s): WBC, RBC, HCT, PLT in the last 72 hours. No results for input(s): NA, K, CL, CO2, BUN, CREATININE, GLUCOSE, CALCIUM in the last 72 hours. No results for input(s): LABPT, INR in the last 72 hours.  Neurologically intact Neurovascular intact Sensation intact distally Intact pulses distally Dorsiflexion/Plantar flexion intact Incision: dressing C/D/I No cellulitis present Compartment soft  Assessment/Plan: 1 Day Post-Op Procedure(s) (LRB): TOTAL KNEE ARTHROPLASTY (Left) Advance diet Up with therapy D/C IV fluids Discharge home with home health after FIRST session of PT WBAT LLE Please remove ace bandage and apply ted hose prior to d/c  Otilio SaberM Lindsey Stanbery 11/01/2016, 7:22 AM

## 2016-11-01 NOTE — Anesthesia Postprocedure Evaluation (Signed)
Anesthesia Post Note  Patient: Zedric L [place]  Procedure(s) Performed: Procedure(s) (LRB): TOTAL KNEE ARTHROPLASTY (Left)  Patient location during evaluation: PACU Anesthesia Type: Spinal Level of consciousness: awake and alert Pain management: pain level controlled Vital Signs Assessment: post-procedure vital signs reviewed and stable Respiratory status: spontaneous breathing and respiratory function stable Cardiovascular status: blood pressure returned to baseline and stable Postop Assessment: spinal receding Anesthetic complications: no       Last Vitals:  Vitals:   10/31/16 2139 11/01/16 0534  BP: (!) 142/72 (!) 154/96  Pulse: 69 82  Resp: 18 18  Temp: 36.5 C 37.6 C    Last Pain:  Vitals:   11/01/16 1023  TempSrc:   PainSc: 0-No pain                 Lewie LoronJohn Alaine Loughney

## 2016-11-11 ENCOUNTER — Other Ambulatory Visit: Payer: Self-pay | Admitting: Family Medicine

## 2016-12-10 ENCOUNTER — Other Ambulatory Visit: Payer: Self-pay

## 2016-12-10 ENCOUNTER — Telehealth: Payer: Self-pay | Admitting: Family Medicine

## 2016-12-10 NOTE — Telephone Encounter (Signed)
Pt states he would like refills on all of his medication. Due to his insurance he now has to use [facility]. Please advise.

## 2016-12-10 NOTE — Telephone Encounter (Signed)
Needs sent to mail order for 90 day supply

## 2016-12-11 MED ORDER — AMLODIPINE BESYLATE 5 MG PO TABS: 7.5000 mg | ORAL_TABLET | Freq: Every day | ORAL | 2 refills | Status: DC

## 2016-12-11 MED ORDER — LOSARTAN POTASSIUM-HCTZ 100-25 MG PO TABS: 1.0000 | ORAL_TABLET | Freq: Every day | ORAL | 2 refills | Status: DC

## 2016-12-11 NOTE — Telephone Encounter (Signed)
Potassium was just short term; other rxs approved

## 2017-01-16 ENCOUNTER — Other Ambulatory Visit: Payer: Self-pay

## 2017-01-16 MED ORDER — TADALAFIL 20 MG PO TABS: ORAL_TABLET | ORAL | 1 refills | Status: DC

## 2017-01-28 ENCOUNTER — Ambulatory Visit: Payer: BLUE CROSS/BLUE SHIELD | Admitting: Family Medicine

## 2017-04-30 LAB — LIPID PANEL
Cholesterol: 177 (ref 0–200)
HDL: 38 (ref 35–70)
LDL Cholesterol: 118
Triglycerides: 107 (ref 40–160)

## 2017-04-30 LAB — BASIC METABOLIC PANEL: Glucose: 98

## 2017-06-06 ENCOUNTER — Encounter: Payer: Self-pay | Admitting: Family Medicine

## 2017-06-06 ENCOUNTER — Ambulatory Visit (INDEPENDENT_AMBULATORY_CARE_PROVIDER_SITE_OTHER): Payer: 59 | Admitting: Family Medicine

## 2017-06-06 VITALS — BP 140/82 | HR 63 | Temp 98.3°F | Resp 14 | Wt 272.3 lb

## 2017-06-06 DIAGNOSIS — Z6834 Body mass index (BMI) 34.0-34.9, adult: Secondary | ICD-10-CM

## 2017-06-06 DIAGNOSIS — Z125 Encounter for screening for malignant neoplasm of prostate: Secondary | ICD-10-CM

## 2017-06-06 DIAGNOSIS — E782 Mixed hyperlipidemia: Secondary | ICD-10-CM | POA: Diagnosis not present

## 2017-06-06 DIAGNOSIS — I10 Essential (primary) hypertension: Secondary | ICD-10-CM

## 2017-06-06 DIAGNOSIS — Z23 Encounter for immunization: Secondary | ICD-10-CM

## 2017-06-06 DIAGNOSIS — E6609 Other obesity due to excess calories: Secondary | ICD-10-CM | POA: Diagnosis not present

## 2017-06-06 MED ORDER — AMLODIPINE BESYLATE 5 MG PO TABS: 7.5000 mg | ORAL_TABLET | Freq: Every day | ORAL | 2 refills | Status: DC

## 2017-06-06 NOTE — Assessment & Plan Note (Addendum)
Not to goal; encouraged DASH guidelines; increase CCB from 5 mg to 7.5 mg daily

## 2017-06-06 NOTE — Assessment & Plan Note (Signed)
Encouraged weight loss 

## 2017-06-06 NOTE — Progress Notes (Signed)
BP 140/82   Pulse 63   Temp 98.3 F (36.8 C) (Oral)   Resp 14   Wt 272 lb 4.8 oz (123.5 kg)   SpO2 97%   BMI 34.96 kg/m    Subjective:    Patient ID: Warren [name], male    DOB: 1951/06/18, 66 y.o.   MRN: [id number]  HPI: Warren [name] is a 66 y.o. male  Chief Complaint  Patient presents with  . Follow-up    HPI Patient is here for f/u; last visit was February 2018; BP was 144/90, weight 276 pounds then  He has high blood pressure; he takes amlodipine 5 mg daily instead of 7.5 mg daily as prescribed, and also taking losartan-hctz daily He checks his BP all the time; he gets more consistent BP readings than last visit; diastolic used to be 90, but now it's 84 at the highest; occasionally 69, 73, 75; pleased with more consistent readings BP Readings from Last 3 Encounters:  06/06/17 140/82  11/01/16 (!) 154/96  10/30/16 (!) 144/90  creatinine was 1.03 on recent labs Aug 2018  He had the other knee replacement done since last visit; did physical therapy; quit short-hopping it he was told, tried [place] him walk normal  He has high cholesterol; he had recent biometric screening, HDL 38, LDL was 118, total was 177, TG 107 Lab Results  Component Value Date   CHOL 179 05/15/2016   HDL 38 (L) 05/15/2016   LDLCALC 67 05/15/2016   TRIG 369 (H) 05/15/2016   CHOLHDL 4.7 05/15/2016    He has obstructive sleep apnea  He has obesity; he is more active, plays golf and walks; losing weight We reviewed BMI calculator and found weights to get his BMI under 30 and down to 27.5  No trouble with slowed urine stream; no fam hx of prostate cancer; interested in getting PSA checked today; used to be done at work, but they're not doing that for him any more  As a child, may have had an ulcer; no NSAIDs; no gastritis or heartburn; he asked about aspirin therapy  Depression screen [facility] 2/9 06/06/2017 10/30/2016 08/06/2016 05/15/2016 02/27/2016  Decreased Interest 0 0 0 0 0  Down, Depressed,  Hopeless 0 0 0 0 0  PHQ - 2 Score 0 0 0 0 0    Relevant past medical, surgical, family and social history reviewed Past Medical History:  Diagnosis Date  . Erectile dysfunction 02/27/2016  . Hypercholesterolemia without hypertriglyceridemia 10/08/2007  . Hypertension   . Malignant hyperthermia 1986   "told I had maybe a mild case of imalignant hyperthermia at [facility]"  . Obesity 02/24/2015  . OSA on CPAP 02/24/2015   CPAP q night   . Osteoarthritis    "feet and knees"  (10/31/2016)   Past Surgical History:  Procedure Laterality Date  . HAMMER TOE SURGERY Bilateral 11/2015 - 01/2016   "right-left"  . JOINT REPLACEMENT    . SOFT TISSUE BIOPSY Right ~ 1986   "@ [facility]"  . TOTAL KNEE ARTHROPLASTY Right 08/22/2016   Procedure: TOTAL KNEE ARTHROPLASTY;  Surgeon: Loreta Ave, MD;  Location: [facility] OR;  Service: Orthopedics;  Laterality: Right;  . TOTAL KNEE ARTHROPLASTY Left 10/31/2016  . TOTAL KNEE ARTHROPLASTY Left 10/31/2016   Procedure: TOTAL KNEE ARTHROPLASTY;  Surgeon: Loreta Ave, MD;  Location: [facility] OR;  Service: Orthopedics;  Laterality: Left;   Family History  Problem Relation Age of Onset  . Heart disease Mother   . Stroke  Mother   . Suicidality Father   . Cancer Sister        breast   Social History   Social History  . Marital status: Married    Spouse name: N/A  . Number of children: N/A  . Years of education: N/A   Occupational History  . Not on file.   Social History Main Topics  . Smoking status: Never Smoker  . Smokeless tobacco: Never Used  . Alcohol use 0.0 oz/week     Comment: 10/31/2016 "wine a couple times/month"  . Drug use: No  . Sexual activity: Yes    Partners: Female   Other Topics Concern  . Not on file   Social History Narrative  . No narrative on file    Interim medical history since last visit reviewed. Allergies and medications reviewed  Review of Systems Per HPI unless specifically indicated above     Objective:    BP 140/82    Pulse 63   Temp 98.3 F (36.8 C) (Oral)   Resp 14   Wt 272 lb 4.8 oz (123.5 kg)   SpO2 97%   BMI 34.96 kg/m   Wt Readings from Last 3 Encounters:  06/06/17 272 lb 4.8 oz (123.5 kg)  10/31/16 274 lb 0.5 oz (124.3 kg)  10/30/16 276 lb (125.2 kg)    Physical Exam  Constitutional: He appears well-developed and well-nourished. No distress.  HENT:  Head: Normocephalic and atraumatic.  Eyes: EOM are normal. No scleral icterus.  Neck: No thyromegaly present.  Cardiovascular: Normal rate and regular rhythm.   Pulmonary/Chest: Effort normal and breath sounds normal.  Abdominal: Soft. He exhibits no distension.  Musculoskeletal: He exhibits no edema.  Surgical scars on both knees; no erythema  Neurological: Coordination normal.  Skin: Skin is warm and dry. No pallor.  Psychiatric: He has a normal mood and affect. His behavior is normal. Judgment and thought content normal.   Results for orders placed or performed during the hospital encounter of 10/31/16  CBC  Result Value Ref Range   WBC 9.8 4.0 - 10.5 K/uL   RBC 4.42 4.22 - 5.81 MIL/uL   Hemoglobin 13.1 13.0 - 17.0 g/dL   HCT 16.1 09.6 - 04.5 %   MCV 89.1 78.0 - 100.0 fL   MCH 29.6 26.0 - 34.0 pg   MCHC 33.2 30.0 - 36.0 g/dL   RDW 40.9 81.1 - 91.4 %   Platelets 235 150 - 400 K/uL  Basic metabolic panel  Result Value Ref Range   Sodium 138 135 - 145 mmol/L   Potassium 3.8 3.5 - 5.1 mmol/L   Chloride 102 101 - 111 mmol/L   CO2 28 22 - 32 mmol/L   Glucose, Bld 105 (H) 65 - 99 mg/dL   BUN 10 6 - 20 mg/dL   Creatinine, Ser 7.82 0.61 - 1.24 mg/dL   Calcium 8.8 (L) 8.9 - 10.3 mg/dL   GFR calc non Af Amer >60 >60 mL/min   GFR calc Af Amer >60 >60 mL/min   Anion gap 8 5 - 15      Assessment & Plan:   Problem List Items Addressed This Visit      Cardiovascular and Mediastinum   Benign essential HTN - Primary    Not to goal; encouraged DASH guidelines; increase CCB from 5 mg to 7.5 mg daily      Relevant Medications    aspirin EC 81 MG tablet   amLODipine (NORVASC) 5 MG tablet  Other   Obesity    Encouraged weight loss      Hyperlipidemia    10 year ASCVD risk calculated at 19%; advised moderate to high intensity statin; he would like other treatments; urged giving up red meat completely, as well as processed meat, anything from cows and pigs; limit egg yolks to no more than 3 per week      Relevant Medications   aspirin EC 81 MG tablet   amLODipine (NORVASC) 5 MG tablet    Other Visit Diagnoses    Needs flu shot       Relevant Orders   Flu Vaccine QUAD 6+ mos PF IM (Fluarix Quad PF) (Completed)   Screening for prostate cancer       Relevant Orders   PSA       Follow up plan: Return in about 3 months (around 09/05/2017) for twenty minute follow-up with fasting labs.  An after-visit summary was printed and given to the patient at check-out.  Please see the patient instructions which may contain other information and recommendations beyond what is mentioned above in the assessment and plan.  Meds ordered this encounter  Medications  . Omega-3 Fatty Acids (FISH OIL) 1200 MG CAPS    Sig: Take 1,200 mg by mouth.  Marland Kitchen aspirin EC 81 MG tablet    Sig: Take 1 tablet (81 mg total) by mouth daily.  Marland Kitchen amLODipine (NORVASC) 5 MG tablet    Sig: Take 1.5 tablets (7.5 mg total) by mouth daily.    Dispense:  135 tablet    Refill:  2    Changing dose    Orders Placed This Encounter  Procedures  . Flu Vaccine QUAD 6+ mos PF IM (Fluarix Quad PF)  . PSA

## 2017-06-06 NOTE — Assessment & Plan Note (Signed)
10 year ASCVD risk calculated at 19%; advised moderate to high intensity statin; he would like other treatments; urged giving up red meat completely, as well as processed meat, anything from cows and pigs; limit egg yolks to no more than 3 per week

## 2017-06-06 NOTE — Patient Instructions (Addendum)
Try to limit saturated fats in your diet (bologna, hot dogs, barbeque, cheeseburgers, hamburgers, steak, bacon, sausage, cheese, etc.) and get more fresh fruits, vegetables, and whole grains Check out the information at familydoctor.org entitled "Nutrition for Weight Loss: What You Need to Know about Fad Diets" Try to lose between 1-2 pounds per week by taking in fewer calories and burning off more calories You can succeed by limiting portions, limiting foods dense in calories and fat, becoming more active, and drinking 8 glasses of water a day (64 ounces) Don't skip meals, especially breakfast, as skipping meals may alter your metabolism Do not use over-the-counter weight loss pills or gimmicks that claim rapid weight loss A healthy BMI (or body mass index) is between 18.5 and 24.9 You can calculate your ideal BMI at the NIH website JobEconomics.hu Your goal blood pressure is less than 140 mmHg on top at least, but under 130 is ideal Try to follow the DASH guidelines (DASH stands for Dietary Approaches to Stop Hypertension) Try to limit the sodium in your diet.  Ideally, consume less than 1.5 grams (less than 1,500mg ) per day. Do not add salt when cooking or at the table.  Check the sodium amount on labels when shopping, and choose items lower in sodium when given a choice. Avoid or limit foods that already contain a lot of sodium. Eat a diet rich in fruits and vegetables and whole grains. We are going to aim for 236 pounds as our next target to get you out of the obesity category I'll suggest quinoa as a complete protein that is healthy  DASH Eating Plan DASH stands for "Dietary Approaches to Stop Hypertension." The DASH eating plan is a healthy eating plan that has been shown to reduce high blood pressure (hypertension). It may also reduce your risk for type 2 diabetes, heart disease, and stroke. The DASH eating plan may also help with weight  loss. What are tips for following this plan? General guidelines  Avoid eating more than 2,300 mg (milligrams) of salt (sodium) a day. If you have hypertension, you may need to reduce your sodium intake to 1,500 mg a day.  Limit alcohol intake to no more than 1 drink a day for nonpregnant women and 2 drinks a day for men. One drink equals 12 oz of beer, 5 oz of wine, or 1 oz of hard liquor.  Work with your health care provider to maintain a healthy body weight or to lose weight. Ask what an ideal weight is for you.  Get at least 30 minutes of exercise that causes your heart to beat faster (aerobic exercise) most days of the week. Activities may include walking, swimming, or biking.  Work with your health care provider or diet and nutrition specialist (dietitian) to adjust your eating plan to your individual calorie needs. Reading food labels  Check food labels for the amount of sodium per serving. Choose foods with less than 5 percent of the Daily Value of sodium. Generally, foods with less than 300 mg of sodium per serving fit into this eating plan.  To find whole grains, look for the word "whole" as the first word in the ingredient list. Shopping  Buy products labeled as "low-sodium" or "no salt added."  Buy fresh foods. Avoid canned foods and premade or frozen meals. Cooking  Avoid adding salt when cooking. Use salt-free seasonings or herbs instead of table salt or sea salt. Check with your health care provider or pharmacist before using salt substitutes.  Do not fry foods. Cook foods using healthy methods such as baking, boiling, grilling, and broiling instead.  Cook with heart-healthy oils, such as olive, canola, soybean, or sunflower oil. Meal planning   Eat a balanced diet that includes: ? 5 or more servings of fruits and vegetables each day. At each meal, try to fill half of your plate with fruits and vegetables. ? Up to 6-8 servings of whole grains each day. ? Less than 6  oz of lean meat, poultry, or fish each day. A 3-oz serving of meat is about the same size as a deck of cards. One egg equals 1 oz. ? 2 servings of low-fat dairy each day. ? A serving of nuts, seeds, or beans 5 times each week. ? Heart-healthy fats. Healthy fats called Omega-3 fatty acids are found in foods such as flaxseeds and coldwater fish, like sardines, salmon, and mackerel.  Limit how much you eat of the following: ? Canned or prepackaged foods. ? Food that is high in trans fat, such as fried foods. ? Food that is high in saturated fat, such as fatty meat. ? Sweets, desserts, sugary drinks, and other foods with added sugar. ? Full-fat dairy products.  Do not salt foods before eating.  Try to eat at least 2 vegetarian meals each week.  Eat more home-cooked food and less restaurant, buffet, and fast food.  When eating at a restaurant, ask that your food be prepared with less salt or no salt, if possible. What foods are recommended? The items listed may not be a complete list. Talk with your dietitian about what dietary choices are best for you. Grains Whole-grain or whole-wheat bread. Whole-grain or whole-wheat pasta. Brown rice. Orpah Cobb. Bulgur. Whole-grain and low-sodium cereals. Pita bread. Low-fat, low-sodium crackers. Whole-wheat flour tortillas. Vegetables Fresh or frozen vegetables (raw, steamed, roasted, or grilled). Low-sodium or reduced-sodium tomato and vegetable juice. Low-sodium or reduced-sodium tomato sauce and tomato paste. Low-sodium or reduced-sodium canned vegetables. Fruits All fresh, dried, or frozen fruit. Canned fruit in natural juice (without added sugar). Meat and other protein foods Skinless chicken or [place]. Ground chicken or [place]. Pork with fat trimmed off. Fish and seafood. Egg whites. Dried beans, peas, or lentils. Unsalted nuts, nut butters, and seeds. Unsalted canned beans. Lean cuts of beef with fat trimmed off. Low-sodium, lean deli  meat. Dairy Low-fat (1%) or fat-free (skim) milk. Fat-free, low-fat, or reduced-fat cheeses. Nonfat, low-sodium ricotta or cottage cheese. Low-fat or nonfat yogurt. Low-fat, low-sodium cheese. Fats and oils Soft margarine without trans fats. Vegetable oil. Low-fat, reduced-fat, or light mayonnaise and salad dressings (reduced-sodium). Canola, safflower, olive, soybean, and sunflower oils. Avocado. Seasoning and other foods Herbs. Spices. Seasoning mixes without salt. Unsalted popcorn and pretzels. Fat-free sweets. What foods are not recommended? The items listed may not be a complete list. Talk with your dietitian about what dietary choices are best for you. Grains Baked goods made with fat, such as croissants, muffins, or some breads. Dry pasta or rice meal packs. Vegetables Creamed or fried vegetables. Vegetables in a cheese sauce. Regular canned vegetables (not low-sodium or reduced-sodium). Regular canned tomato sauce and paste (not low-sodium or reduced-sodium). Regular tomato and vegetable juice (not low-sodium or reduced-sodium). Rosita Fire. Olives. Fruits Canned fruit in a light or heavy syrup. Fried fruit. Fruit in cream or butter sauce. Meat and other protein foods Fatty cuts of meat. Ribs. Fried meat. Tomasa Blase. Sausage. Bologna and other processed lunch meats. Salami. Fatback. Hotdogs. Bratwurst. Salted nuts and seeds. Canned beans  with added salt. Canned or smoked fish. Whole eggs or egg yolks. Chicken or [place] with skin. Dairy Whole or 2% milk, cream, and half-and-half. Whole or full-fat cream cheese. Whole-fat or sweetened yogurt. Full-fat cheese. Nondairy creamers. Whipped toppings. Processed cheese and cheese spreads. Fats and oils Butter. Stick margarine. Lard. Shortening. Ghee. Bacon fat. Tropical oils, such as coconut, palm kernel, or palm oil. Seasoning and other foods Salted popcorn and pretzels. Onion salt, garlic salt, seasoned salt, table salt, and sea salt. Worcestershire  sauce. Tartar sauce. Barbecue sauce. Teriyaki sauce. Soy sauce, including reduced-sodium. Steak sauce. Canned and packaged gravies. Fish sauce. Oyster sauce. Cocktail sauce. Horseradish that you find on the shelf. Ketchup. Mustard. Meat flavorings and tenderizers. Bouillon cubes. Hot sauce and Tabasco sauce. Premade or packaged marinades. Premade or packaged taco seasonings. Relishes. Regular salad dressings. Where to find more information:  National Heart, Lung, and Blood Institute: PopSteam.is  American Heart Association: www.heart.org Summary  The DASH eating plan is a healthy eating plan that has been shown to reduce high blood pressure (hypertension). It may also reduce your risk for type 2 diabetes, heart disease, and stroke.  With the DASH eating plan, you should limit salt (sodium) intake to 2,300 mg a day. If you have hypertension, you may need to reduce your sodium intake to 1,500 mg a day.  When on the DASH eating plan, aim to eat more fresh fruits and vegetables, whole grains, lean proteins, low-fat dairy, and heart-healthy fats.  Work with your health care provider or diet and nutrition specialist (dietitian) to adjust your eating plan to your individual calorie needs. This information is not intended to replace advice given to you by your health care provider. Make sure you discuss any questions you have with your health care provider. Document Released: 08/16/2011 Document Revised: 08/20/2016 Document Reviewed: 08/20/2016 Elsevier Interactive Patient Education  2017 [employer].

## 2017-06-07 ENCOUNTER — Other Ambulatory Visit: Payer: Self-pay | Admitting: Family Medicine

## 2017-06-07 ENCOUNTER — Encounter: Payer: Self-pay | Admitting: Family Medicine

## 2017-06-07 DIAGNOSIS — R972 Elevated prostate specific antigen [PSA]: Secondary | ICD-10-CM | POA: Insufficient documentation

## 2017-06-07 HISTORY — DX: Elevated prostate specific antigen (PSA): R97.20

## 2017-06-07 LAB — PSA: Prostate Specific Ag, Serum: 6.2 ng/mL — ABNORMAL HIGH (ref 0.0–4.0)

## 2017-06-07 NOTE — Progress Notes (Signed)
Refer to urologist; message left on phone for patient, and note sent through MyChart

## 2017-06-07 NOTE — Assessment & Plan Note (Signed)
Refer to uro

## 2017-06-11 ENCOUNTER — Ambulatory Visit (INDEPENDENT_AMBULATORY_CARE_PROVIDER_SITE_OTHER): Payer: 59 | Admitting: Urology

## 2017-06-11 ENCOUNTER — Encounter: Payer: Self-pay | Admitting: Urology

## 2017-06-11 VITALS — BP 134/57 | HR 73 | Ht 74.0 in | Wt 272.0 lb

## 2017-06-11 DIAGNOSIS — R972 Elevated prostate specific antigen [PSA]: Secondary | ICD-10-CM

## 2017-06-11 DIAGNOSIS — R351 Nocturia: Secondary | ICD-10-CM | POA: Diagnosis not present

## 2017-06-11 NOTE — Progress Notes (Signed)
06/11/2017 4:50 PM   Kirsten L [place] 1951/09/02 [id number]  Referring provider: Kerman Passey, MD [address] [place], [place] [address]  Chief Complaint  Patient presents with  . Elevated PSA    New Patient    HPI: 66 year old male referred for further evaluation of elevated PSA.  He recently underwent routine screening PSA by his PCP found to have an elevated PSA of 6.2. Previous PSAs in the past and been in the 3 range dating back greater than 5 years ago. PSA trend as below.  He's never been seen and evaluated by urologist for elevated PSA.  No weight loss or bone pain.  No family history of prostate cancer.  He does have nocturia x0-2.  He also has feel that he voids frequency during the day but drinks plenty of water.  His stream is good but occationally feels like he doesn't feel like he emtpys, worse at night.  No dysuria or gross hematuria. Overall, he stopped it fairly bothered by the symptoms and is not interested in starting any medication to help.  He did have surgery in 10/2016 and reports some difficulty voiding initially after the procedure. He reports that he waited it out and ultimately did void. He recalls being told that they had a difficult time with the catheter possibly intraoperatively but I see no documentation of this.  3/ 2012- 3.3 03/2012-  3.4 9/ 2018- 6.2  PMH: Past Medical History:  Diagnosis Date  . Elevated PSA 06/07/2017   6.2 -- refer to urologist Sept 2018  . Erectile dysfunction 02/27/2016  . Hypercholesterolemia without hypertriglyceridemia 10/08/2007  . Hypertension   . Malignant hyperthermia 1986   "told I had maybe a mild case of imalignant hyperthermia at [facility]"  . Obesity 02/24/2015  . OSA on CPAP 02/24/2015   CPAP q night   . Osteoarthritis    "feet and knees"  (10/31/2016)    Surgical History: Past Surgical History:  Procedure Laterality Date  . HAMMER TOE SURGERY Bilateral 11/2015 - 01/2016   "right-left"  . JOINT  REPLACEMENT    . SOFT TISSUE BIOPSY Right ~ 1986   "@ [facility]"  . TOTAL KNEE ARTHROPLASTY Right 08/22/2016   Procedure: TOTAL KNEE ARTHROPLASTY;  Surgeon: Loreta Ave, MD;  Location: [facility] OR;  Service: Orthopedics;  Laterality: Right;  . TOTAL KNEE ARTHROPLASTY Left 10/31/2016  . TOTAL KNEE ARTHROPLASTY Left 10/31/2016   Procedure: TOTAL KNEE ARTHROPLASTY;  Surgeon: Loreta Ave, MD;  Location: [facility] OR;  Service: Orthopedics;  Laterality: Left;    Home Medications:  Allergies as of 06/11/2017   No Known Allergies     Medication List       Accurate as of 06/11/17 11:59 PM. Always use your most recent med list.          amLODipine 5 MG tablet Commonly known as:  NORVASC Take 1.5 tablets (7.5 mg total) by mouth daily.   aspirin EC 81 MG tablet Take 1 tablet (81 mg total) by mouth daily.   Fish Oil 1200 MG Caps Take 1,200 mg by mouth.   losartan-hydrochlorothiazide 100-25 MG tablet Commonly known as:  HYZAAR Take 1 tablet by mouth daily.   tadalafil 20 MG tablet Commonly known as:  CIALIS One by mouth every two days if needed prior for intimacy (only for intermittent use)       Allergies: No Known Allergies  Family History: Family History  Problem Relation Age of Onset  . Heart disease  Mother   . Stroke Mother   . Suicidality Father   . Cancer Sister        breast    Social History:  reports that he has never smoked. He has never used smokeless tobacco. He reports that he drinks alcohol. He reports that he does not use drugs.  ROS: UROLOGY Frequent Urination?: Yes Hard to postpone urination?: No Burning/pain with urination?: No Get up at night to urinate?: Yes Leakage of urine?: No Urine stream starts and stops?: Yes Trouble starting stream?: No Do you have to strain to urinate?: No Blood in urine?: No Urinary tract infection?: No Sexually transmitted disease?: No Injury to kidneys or bladder?: No Painful intercourse?: No Weak stream?: Yes Erection  problems?: No Penile pain?: No  Gastrointestinal Nausea?: No Vomiting?: No Indigestion/heartburn?: No Diarrhea?: No Constipation?: No  Constitutional Fever: No Night sweats?: No Weight loss?: No Fatigue?: No  Skin Skin rash/lesions?: No Itching?: No  Eyes Blurred vision?: No Double vision?: No  Ears/Nose/Throat Sore throat?: No Sinus problems?: No  Hematologic/Lymphatic Swollen glands?: No Easy bruising?: No  Cardiovascular Leg swelling?: No Chest pain?: No  Respiratory Cough?: No Shortness of breath?: No  Endocrine Excessive thirst?: No  Musculoskeletal Back pain?: No Joint pain?: Yes  Neurological Headaches?: No Dizziness?: No  Psychologic Depression?: No Anxiety?: No  Physical Exam: BP (!) 134/57   Pulse 73   Ht  (1.88 m)   Wt 272 lb (123.4 kg)   BMI 34.92 kg/m   Constitutional:  Alert and oriented, No acute distress. HEENT: [place] AT, moist mucus membranes.  Trachea midline, no masses. Cardiovascular: No clubbing, cyanosis, or edema. Respiratory: Normal respiratory effort, no increased work of breathing. GI: Abdomen is soft, nontender, nondistended, no abdominal masses GU: No CVA tenderness.  Rectal exam: Normal sphincter tone.  Enlarged prostate, nontender, no obvious nodules.  Unable to adequately palpate base of prostate due to habitus and size of the gland. Skin: No rashes, bruises or suspicious lesions. Neurologic: Grossly intact, no focal deficits, moving all 4 extremities. Psychiatric: Normal mood and affect.  Laboratory Data: Lab Results  Component Value Date   WBC 9.8 11/01/2016   HGB 13.1 11/01/2016   HCT 39.4 11/01/2016   MCV 89.1 11/01/2016   PLT 235 11/01/2016    Lab Results  Component Value Date   CREATININE 1.00 11/01/2016    Lab Results  Component Value Date   PSA1 6.2 (H) 06/06/2017    Urinalysis n/a  Pertinent Imaging: N/a  Assessment & Plan:    1. Elevated PSA  We reviewed the implications of  an elevated PSA and the uncertainty surrounding it. In general, a man's PSA increases with age and is produced by both normal and cancerous prostate tissue. Differential for elevated PSA is BPH, prostate cancer, infection, recent intercourse/ejaculation, prostate infarction, recent urethroscopic manipulation (foley placement/cystoscopy) and prostatitis. Management of an elevated PSA can include observation or prostate biopsy and wediscussed this in detail.  We discussed that indications for prostate biopsy are defined by age and race specific PSA cutoffs as well as a PSA velocity of 0.75/year.  Plan to repeat PSA in about 4 weeks. If PSA remains elevated, we'll recommend pursuing prostate biopsy. I'll call him with results and recommendations.  We did go ahead and review risks of prostate biopsy and discussed the procedure at length.   2. Nocturia Voiding symptoms at baseline including nocturia. Behavioral modification including avoidance of beverages in the late evening were reviewed. Offered trial of Flomax  but declined, minimal bother.   Return in about 4 weeks (around 07/09/2017) for PSA lab visit only (will call with plan based on this PSA).  Vanna Scotland, MD  [facility] [address] [place], [place] [address] [phone]

## 2017-06-17 ENCOUNTER — Encounter: Payer: Self-pay | Admitting: Podiatry

## 2017-06-17 ENCOUNTER — Ambulatory Visit (INDEPENDENT_AMBULATORY_CARE_PROVIDER_SITE_OTHER): Payer: 59 | Admitting: Podiatry

## 2017-06-17 DIAGNOSIS — L603 Nail dystrophy: Secondary | ICD-10-CM | POA: Diagnosis not present

## 2017-06-17 DIAGNOSIS — M2042 Other hammer toe(s) (acquired), left foot: Secondary | ICD-10-CM

## 2017-06-20 NOTE — Progress Notes (Signed)
Subjective: [name] presents today for his main concern being that his toenails are curving down causing some pressure into the skin at the tips the toes. He states that from the hammertoes he is doing well he has not had any pain although he has noticed the toe surgically curled some. He denies any swelling or redness to the toes himself and he has no pain. He's been wearing a regular shoe without any issues. He has not yet returned to work because he's had bilateral knee replacements and he has not been to retire. He states he's been playing golf been active without any issues to his feet. Denies any systemic complaints such as fevers, chills, nausea, vomiting. No acute changes since last appointment, and no other complaints at this time.   Objective: AAO x3, NAD DP/PT pulses palpable bilaterally, CRT less than 3 seconds Scars the prior surgery are well-healed. There is mild recurrence of the hammertoe deformity bilaterally however there is no pain in the toes and there is no edema, erythema, increase in warmth. Specifically to the toenails himself they do curved distally putting pressure over the distal aspect of the toe how there is no edema, erythema, drainage or pus was no clinical signs of infection of the toenails. Overall he states he is still happy the results of the surgery No other areas of tenderness identified bilaterally there is no other deformity present. No open lesions or pre-ulcerative lesions.  No pain with calf compression, swelling, warmth, erythema  Assessment: Mild recurrence of hammertoe with onychodystrophy  Plan: -All treatment options discussed with the patient including all alternatives, risks, complications.  -At this point having no pain to the toes from the surgeries thereby held off on x-rays today. His main concern with this toenails. I discussed the nail trimming techniques to help prevent the distal port of the nail from growing into the skin. There is no  incurvation along the sides of this is only distally. He can try tea tree oil as well the nails to see if this well help.  -Continue supportive shoes. Continue toe crescent needed be has not been wearing this or needing this he felt. -He had no further questions or concerns.  -Patient encouraged to call the office with any questions, concerns, change in symptoms.   Ovid Curd, DPM

## 2017-07-03 ENCOUNTER — Ambulatory Visit (INDEPENDENT_AMBULATORY_CARE_PROVIDER_SITE_OTHER): Payer: 59 | Admitting: Family Medicine

## 2017-07-03 ENCOUNTER — Encounter: Payer: Self-pay | Admitting: Family Medicine

## 2017-07-03 VITALS — BP 122/70 | HR 68 | Temp 98.0°F | Resp 16 | Ht 72.75 in | Wt 274.8 lb

## 2017-07-03 DIAGNOSIS — Z7189 Other specified counseling: Secondary | ICD-10-CM | POA: Insufficient documentation

## 2017-07-03 DIAGNOSIS — H6123 Impacted cerumen, bilateral: Secondary | ICD-10-CM | POA: Diagnosis not present

## 2017-07-03 DIAGNOSIS — Z9189 Other specified personal risk factors, not elsewhere classified: Secondary | ICD-10-CM

## 2017-07-03 DIAGNOSIS — Z0000 Encounter for general adult medical examination without abnormal findings: Secondary | ICD-10-CM | POA: Insufficient documentation

## 2017-07-03 DIAGNOSIS — Z1211 Encounter for screening for malignant neoplasm of colon: Secondary | ICD-10-CM | POA: Insufficient documentation

## 2017-07-03 NOTE — Assessment & Plan Note (Signed)
USPSTF grade A and B recommendations reviewed with patient; age-appropriate recommendations, preventive care, screening tests, etc discussed and encouraged; healthy living encouraged; see AVS for patient education given to patient Offered additional labs (he had labs through work in August 2018); patient declined Offered shingrix; patient declined but was given information about this vaccine

## 2017-07-03 NOTE — Progress Notes (Signed)
Patient ID: Zidane L [name], male   DOB: [birth year], 66 y.o.   MRN: [id number]   Subjective:   Warren [name] is a 66 y.o. male here for a complete physical exam  Interim issues since last visit: he has tried to cut down on the pigs and cows; hardest thing was cheese; trying to cut on sausage and hams  USPSTF grade A and B recommendations Depression:  Depression screen [facility] 2/9 07/03/2017 06/06/2017 10/30/2016 08/06/2016 05/15/2016  Decreased Interest 0 0 0 0 0  Down, Depressed, Hopeless 0 0 0 0 0  PHQ - 2 Score 0 0 0 0 0   Hypertension: BP Readings from Last 3 Encounters:  07/03/17 122/70  06/11/17 (!) 134/57  06/06/17 140/82   Obesity: working this, has a plan, has a BMI to reach Wt Readings from Last 3 Encounters:  07/03/17 274 lb 12.8 oz (124.6 kg)  06/11/17 272 lb (123.4 kg)  06/06/17 272 lb 4.8 oz (123.5 kg)   BMI Readings from Last 3 Encounters:  07/03/17 36.51 kg/m  06/11/17 34.92 kg/m  06/06/17 34.96 kg/m    Alcohol: occasional Tobacco use: no HIV, hep B, hep C: not necesssary Married STD testing and prevention (chl/gon/syphilis): not necessary Lipids: not fasting today He has his [facility] results with him today on his phone Total 177, HDL 38, LDL 118, TG 107 drawn on 04/30/17 We reviewed the 10 year ASCVD risk, 14.9% risk of 10 year event He'll consider Lab Results  Component Value Date   CHOL 177 04/30/2017   CHOL 179 05/15/2016   Lab Results  Component Value Date   HDL 38 04/30/2017   HDL 38 (L) 05/15/2016   Lab Results  Component Value Date   LDLCALC 118 04/30/2017   LDLCALC 67 05/15/2016   Lab Results  Component Value Date   TRIG 107 04/30/2017   TRIG 369 (H) 05/15/2016   Lab Results  Component Value Date   CHOLHDL 4.7 05/15/2016   No results found for: LDLDIRECT Glucose: 98 Glucose, Bld  Date Value Ref Range Status  11/01/2016 105 (H) 65 - 99 mg/dL Final  40/98/1191 478 (H) 65 - 99 mg/dL Final  29/56/2130 90 65 - 99 mg/dL Final    Colorectal cancer: 2014; they gave him 10 year pass; no fam hx Prostate cancer: scheduled to see urologist; his PSA was up and he goes back at the end of the month to recheck No results found for: PSA Lung cancer:  Never smoker AAA: n/a Aspirin: taking daily Diet: really working on this, cutting down on the fatty meats and cheese Exercise: playing golf and walking; not using the cart Skin cancer: no worrisome moles; nothing changed  Past Medical History:  Diagnosis Date  . Elevated PSA 06/07/2017   6.2 -- refer to urologist Sept 2018  . Erectile dysfunction 02/27/2016  . Hypercholesterolemia without hypertriglyceridemia 10/08/2007  . Hypertension   . Malignant hyperthermia 1986   "told I had maybe a mild case of imalignant hyperthermia at [facility]"  . Obesity 02/24/2015  . OSA on CPAP 02/24/2015   CPAP q night   . Osteoarthritis    "feet and knees"  (10/31/2016)   Past Surgical History:  Procedure Laterality Date  . HAMMER TOE SURGERY Bilateral 11/2015 - 01/2016   "right-left"  . JOINT REPLACEMENT    . SOFT TISSUE BIOPSY Right ~ 1986   "@ [facility]"  . TOTAL KNEE ARTHROPLASTY Right 08/22/2016   Procedure: TOTAL KNEE ARTHROPLASTY;  Surgeon: Loreta Ave,  MD;  Location: [facility] OR;  Service: Orthopedics;  Laterality: Right;  . TOTAL KNEE ARTHROPLASTY Left 10/31/2016  . TOTAL KNEE ARTHROPLASTY Left 10/31/2016   Procedure: TOTAL KNEE ARTHROPLASTY;  Surgeon: Loreta Ave, MD;  [facility];  Service: Orthopedics;  Laterality: Left;   Family History  Problem Relation Age of Onset  . Heart disease Mother   . Stroke Mother   . Suicidality Father   . Cancer Sister        breast   Social History  Substance Use Topics  . Smoking status: Never Smoker  . Smokeless tobacco: Never Used  . Alcohol use 0.0 oz/week     Comment: 10/31/2016 "wine a couple times/month"   Review of Systems  Cardiovascular: Negative for chest pain and leg swelling.    Objective:   Vitals:   07/03/17 1034   BP: 122/70  Pulse: 68  Resp: 16  Temp: 98 F (36.7 C)  TempSrc: Oral  SpO2: 95%  Weight: 274 lb 12.8 oz (124.6 kg)  Height: 6' 0.75" (1.848 m)   Body mass index is 36.51 kg/m. Wt Readings from Last 3 Encounters:  07/03/17 274 lb 12.8 oz (124.6 kg)  06/11/17 272 lb (123.4 kg)  06/06/17 272 lb 4.8 oz (123.5 kg)   Physical Exam  Constitutional: He appears well-developed and well-nourished. No distress.  HENT:  Head: Normocephalic and atraumatic.  Right Ear: Tympanic membrane and ear canal normal.  Left Ear: Tympanic membrane and ear canal normal.  Nose: Nose normal.  Mouth/Throat: Oropharynx is clear and moist.  Impacted cerumen bilaterally; ear lavage performed on both sides with good results; patient tolerated well; TMs clearly visible afterwards with no erythema or perforation  Eyes: EOM are normal. No scleral icterus.  Neck: No JVD present. No thyromegaly present.  Cardiovascular: Normal rate, regular rhythm and normal heart sounds.   Pulmonary/Chest: Effort normal and breath sounds normal. No respiratory distress. He has no wheezes. He has no rales.  Abdominal: Soft. Bowel sounds are normal. He exhibits no distension. There is no tenderness. There is no guarding.  Musculoskeletal: Normal range of motion. He exhibits no edema.  Lymphadenopathy:    He has no cervical adenopathy.  Neurological: He is alert. He displays normal reflexes. He exhibits normal muscle tone. Coordination normal.  Skin: Skin is warm and dry. No rash noted. He is not diaphoretic. No erythema. No pallor.  Psychiatric: He has a normal mood and affect. His behavior is normal. Judgment and thought content normal.    Assessment/Plan:   Problem List Items Addressed This Visit      Other   Preventative health care - Primary    USPSTF grade A and B recommendations reviewed with patient; age-appropriate recommendations, preventive care, screening tests, etc discussed and encouraged; healthy living  encouraged; see AVS for patient education given to patient Offered additional labs (he had labs through work in August 2018); patient declined Offered shingrix; patient declined but was given information about this vaccine       Other Visit Diagnoses    At risk for heart disease       ASCVD 10 year risk assessment done with patient today, 14.9%; recommended mod-to-high intensity statin; he declined; he'll try to work on weight loss; cont ASA   Bilateral impacted cerumen          No orders of the defined types were placed in this encounter.  Orders Placed This Encounter  Procedures  . Basic metabolic panel  This external order was created through the Results Console.  . Lipid panel    This external order was created through the Results Console.    Follow up plan: Return in about 7 months (around 01/20/2018) [facility].  An After Visit Summary was printed and given to the patient.

## 2017-07-03 NOTE — Patient Instructions (Addendum)
Health Maintenance, Male A healthy lifestyle and preventive care is important for your health and wellness. Ask your health care provider about what schedule of regular examinations is right for you. What should I know about weight and diet? Eat a Healthy Diet  Eat plenty of vegetables, fruits, whole grains, low-fat dairy products, and lean protein.  Do not eat a lot of foods high in solid fats, added sugars, or salt.  Maintain a Healthy Weight Regular exercise can help you achieve or maintain a healthy weight. You should:  Do at least 150 minutes of exercise each week. The exercise should increase your heart rate and make you sweat (moderate-intensity exercise).  Do strength-training exercises at least twice a week.  Watch Your Levels of Cholesterol and Blood Lipids  Have your blood tested for lipids and cholesterol every 5 years starting at 64 years of age. If you are at high risk for heart disease, you should start having your blood tested when you are 64 years old. You may need to have your cholesterol levels checked more often if: ? Your lipid or cholesterol levels are high. ? You are older than 64 years of age. ? You are at high risk for heart disease.  What should I know about cancer screening? Many types of cancers can be detected early and may often be prevented. Lung Cancer  You should be screened every year for lung cancer if: ? You are a current smoker who has smoked for at least 30 years. ? You are a former smoker who has quit within the past 15 years.  Talk to your health care provider about your screening options, when you should start screening, and how often you should be screened.  Colorectal Cancer  Routine colorectal cancer screening usually begins at 64 years of age and should be repeated every 5-10 years until you are 64 years old. You may need to be screened more often if early forms of precancerous polyps or small growths are found. Your health care provider  may recommend screening at an earlier age if you have risk factors for colon cancer.  Your health care provider may recommend using home test kits to check for hidden blood in the stool.  A small camera at the end of a tube can be used to examine your colon (sigmoidoscopy or colonoscopy). This checks for the earliest forms of colorectal cancer.  Prostate and Testicular Cancer  Depending on your age and overall health, your health care provider may do certain tests to screen for prostate and testicular cancer.  Talk to your health care provider about any symptoms or concerns you have about testicular or prostate cancer.  Skin Cancer  Check your skin from head to toe regularly.  Tell your health care provider about any new moles or changes in moles, especially if: ? There is a change in a mole's size, shape, or color. ? You have a mole that is larger than a pencil eraser.  Always use sunscreen. Apply sunscreen liberally and repeat throughout the day.  Protect yourself by wearing long sleeves, pants, a wide-brimmed hat, and sunglasses when outside.  What should I know about heart disease, diabetes, and high blood pressure?  If you are 18-39 years of age, have your blood pressure checked every 3-5 years. If you are 40 years of age or older, have your blood pressure checked every year. You should have your blood pressure measured twice-once when you are at a hospital or clinic, and once when   you are not at a hospital or clinic. Record the average of the two measurements. To check your blood pressure when you are not at a hospital or clinic, you can use: ? An automated blood pressure machine at a pharmacy. ? A home blood pressure monitor.  Talk to your health care provider about your target blood pressure.  If you are between 5345-564 years old, ask your health care provider if you should take aspirin to prevent heart disease.  Have regular diabetes screenings by checking your fasting blood  sugar level. ? If you are at a normal weight and have a low risk for diabetes, have this test once every three years after the age of 64. ? If you are overweight and have a high risk for diabetes, consider being tested at a younger age or more often.  A one-time screening for abdominal aortic aneurysm (AAA) by ultrasound is recommended for men aged 65-75 years who are current or former smokers. What should I know about preventing infection? Hepatitis B If you have a higher risk for hepatitis B, you should be screened for this virus. Talk with your health care provider to find out if you are at risk for hepatitis B infection. Hepatitis C Blood testing is recommended for:  Everyone born from [id number] through 1965.  Anyone with known risk factors for hepatitis C.  Sexually Transmitted Diseases (STDs)  You should be screened each year for STDs including gonorrhea and chlamydia if: ? You are sexually active and are younger than 64 years of age. ? You are older than 64 years of age and your health care provider tells you that you are at risk for this type of infection. ? Your sexual activity has changed since you were last screened and you are at an increased risk for chlamydia or gonorrhea. Ask your health care provider if you are at risk.  Talk with your health care provider about whether you are at high risk of being infected with HIV. Your health care provider may recommend a prescription medicine to help prevent HIV infection.  What else can I do?  Schedule regular health, dental, and eye exams.  Stay current with your vaccines (immunizations).  Do not use any tobacco products, such as cigarettes, chewing tobacco, and e-cigarettes. If you need help quitting, ask your health care provider.  Limit alcohol intake to no more than 2 drinks per day. One drink equals 12 ounces of beer, 5 ounces of wine, or 1 ounces of hard liquor.  Do not use street drugs.  Do not share needles.  Ask your  health care provider for help if you need support or information about quitting drugs.  Tell your health care provider if you often feel depressed.  Tell your health care provider if you have ever been abused or do not feel safe at home. This information is not intended to replace advice given to you by your health care provider. Make sure you discuss any questions you have with your health care provider. Document Released: 02/23/2008 Document Revised: 04/25/2016 Document Reviewed: 05/31/2015 Elsevier Interactive Patient Education  2018 [employer] Inc[employer]  Heart Disease Prevention Heart disease is a leading cause of death. There are many things you can do to help prevent heart disease. Be physically active Physical activity is good for your heart. It helps control your blood pressure, cholesterol levels, and weight. Try to be physically active every day. Ask your health care provider what activities are best for you. Be a healthy  weight Extra weight can strain your heart and affect your blood pressure and cholesterol levels. Lose weight with diet and exercise if recommended by your health care provider. Eat heart-healthy foods Follow a healthy eating plan as recommended by your health care provider or dietitian. Heart-healthy foods include:  High-fiber foods. These include oat bran, oatmeal, and whole-grain breads and cereals.  Fruits and vegetables.  Avoid:  Alcohol.  Fried foods.  Foods high in saturated fat. These include meats, butter, whole dairy products, shortening, and coconut or palm oil.  Salty foods. These include canned food, luncheon meat, salty snacks, and fast food.  Keep your cholesterol levels under control Cholesterol is a substance that is used for many important functions. When your cholesterol levels are high, cholesterol can stick to the insides of your blood vessels, making them narrow or clog. This can lead to chest pain (angina) and a heart attack. Keep your  cholesterol levels under control as recommended by your health care provider. Have your cholesterol checked at least once a year. Target cholesterol levels (in mg/dL) for most people are:  Total cholesterol below 200.  LDL cholesterol below 100.  HDL cholesterol above 40 in men and above 50 in women.  Triglycerides below 150.  Keep your blood pressure under control Having high blood pressure (hypertension) puts you at risk for stroke and other forms of heart disease. Keep your blood pressure under control as recommended by your health care provider. Ask your health care provider if you need treatment to lower your blood pressure. If you are 22-38 years of age, have your blood pressure checked every 3-5 years. If you are 16 years of age or older, have your blood pressure checked every year. Do not use tobacco products Tobacco smoke can damage your heart and blood vessels. Do not use any tobacco products including cigarettes, chewing tobacco, or electronic cigarettes. If you need help quitting, ask your health care provider. Take medicines as directed Take medicines only as directed by your health care provider. Ask your health care provider whether you should take an aspirin every day. Taking aspirin can help reduce your risk of heart disease and stroke. Where to find more information: To find out more about heart disease, visit the American Heart Association's website at www.americanheart.org This information is not intended to replace advice given to you by your health care provider. Make sure you discuss any questions you have with your health care provider. Document Released: 04/10/2004 Document Revised: 01/25/2016 Document Reviewed: 10/21/2013 Elsevier Interactive Patient Education  2017 [employer].

## 2017-07-08 ENCOUNTER — Other Ambulatory Visit: Payer: Self-pay

## 2017-07-08 DIAGNOSIS — R972 Elevated prostate specific antigen [PSA]: Secondary | ICD-10-CM

## 2017-07-09 ENCOUNTER — Other Ambulatory Visit: Payer: 59

## 2017-07-09 DIAGNOSIS — R972 Elevated prostate specific antigen [PSA]: Secondary | ICD-10-CM

## 2017-07-10 ENCOUNTER — Telehealth: Payer: Self-pay

## 2017-07-10 LAB — PSA: Prostate Specific Ag, Serum: 6.4 ng/mL — ABNORMAL HIGH (ref 0.0–4.0)

## 2017-07-10 NOTE — Telephone Encounter (Signed)
Spoke with pt in reference to rising PSA and prostate bx. Reviewed pre procedure instructions. Bx and results appts made. Pt voiced understanding. Appts and pre procedure info sheet mailed.

## 2017-07-10 NOTE — Telephone Encounter (Signed)
-----   Message from Vanna ScotlandAshley Brandon, MD sent at 07/10/2017 12:25 PM EDT ----- PSA still up (and rising slightly).  This is very concerning and recommend biopsy as discussed.  Please arrange.    Vanna ScotlandAshley Brandon, MD

## 2017-07-23 ENCOUNTER — Ambulatory Visit (INDEPENDENT_AMBULATORY_CARE_PROVIDER_SITE_OTHER): Payer: 59 | Admitting: Urology

## 2017-07-23 ENCOUNTER — Encounter: Payer: Self-pay | Admitting: Urology

## 2017-07-23 ENCOUNTER — Other Ambulatory Visit: Payer: Self-pay | Admitting: Urology

## 2017-07-23 VITALS — BP 136/70 | HR 74 | Ht 74.0 in | Wt 272.0 lb

## 2017-07-23 DIAGNOSIS — R972 Elevated prostate specific antigen [PSA]: Secondary | ICD-10-CM | POA: Diagnosis not present

## 2017-07-23 MED ORDER — GENTAMICIN SULFATE 40 MG/ML IJ SOLN
80.0000 mg | Freq: Once | INTRAMUSCULAR | Status: AC
  Administered 2017-07-23: 80 mg via INTRAMUSCULAR

## 2017-07-23 MED ORDER — LEVOFLOXACIN 500 MG PO TABS
500.0000 mg | ORAL_TABLET | Freq: Once | ORAL | Status: AC
  Administered 2017-07-23: 500 mg via ORAL

## 2017-07-23 NOTE — Progress Notes (Signed)
   07/23/17  CC:  Chief Complaint  Patient presents with  . Prostate Biopsy    HPI: 66 year old male who presents today for prostate biopsy.  He had a slowly rising PSA, most recently up to 6.4.  Blood pressure 136/70, pulse 74, height 6\' 2"  (1.88 m), weight 272 lb (123.4 kg). NED. A&Ox3.   No respiratory distress   Abd soft, NT, ND Normal sphincter tone  Prostate Biopsy Procedure   Informed consent was obtained after discussing risks/benefits of the procedure.  A time out was performed to ensure correct patient identity.  Pre-Procedure: - Gentamicin given prophylactically - Levaquin 500 mg administered PO -Transrectal Ultrasound performed revealing a 21.6 gm prostate -Significant median lobe with intravesical protrusion appreciated.  Diffuse nonspecific calcifications.  No discrete hypoechoic lesions.  BPH nodules appreciated symmetrically bilateral.  Procedure: - Prostate block performed using 10 cc 1% lidocaine and biopsies taken from sextant areas, a total of 12 under ultrasound guidance.  Post-Procedure: - Patient tolerated the procedure well - He was counseled to seek immediate medical attention if experiences any severe pain, significant bleeding, or fevers - Return in one week to discuss biopsy results  Assessment/ Plan:  1. Elevated PSA s/p uncomplicated biopsy Warning symptoms reviewed Follow-up as scheduled - gentamicin (GARAMYCIN) injection 80 mg - levofloxacin (LEVAQUIN) tablet 500 mg   Return for as scheduled for results.  Vanna ScotlandAshley Blake Goya, MD

## 2017-07-27 ENCOUNTER — Other Ambulatory Visit: Payer: Self-pay | Admitting: Family Medicine

## 2017-07-30 ENCOUNTER — Other Ambulatory Visit: Payer: Self-pay | Admitting: Urology

## 2017-07-30 LAB — PATHOLOGY REPORT

## 2017-08-06 ENCOUNTER — Ambulatory Visit: Payer: Self-pay | Admitting: Urology

## 2017-08-08 ENCOUNTER — Encounter: Payer: Self-pay | Admitting: Urology

## 2017-08-08 ENCOUNTER — Ambulatory Visit (INDEPENDENT_AMBULATORY_CARE_PROVIDER_SITE_OTHER): Payer: 59 | Admitting: Urology

## 2017-08-08 VITALS — BP 106/63 | HR 71 | Ht 74.0 in | Wt 273.0 lb

## 2017-08-08 DIAGNOSIS — R972 Elevated prostate specific antigen [PSA]: Secondary | ICD-10-CM | POA: Diagnosis not present

## 2017-08-08 NOTE — Progress Notes (Signed)
08/08/2017 11:56 AM   [place] 1951-02-12 [id number]  Referring provider: Kerman PasseyLada, Melinda P, MD [address] [place] [id number]  No chief complaint on file.   HPI: 66 year old male with elevated PSA to 6.4 status post prostate biopsy on 07/23/2017.    Prostate biopsy reveals no evidence of malignancy.  He does have one core at the left base which showed evidence of high-grade PIN with adjacent small atypical glands.  This is not diagnostic for prostate cancer but somewhat suspicious.  TRUS volume 21.6 cc  No post biopsy issues.  Gross hematuria and rectal bleeding have subsided.    IPSS/ SHIM as below.    His primary complaint today is dizziness/vertigo for which he is undergoing further workup treatment by his primary care physician.  IPSS    Row Name 08/15/17 1100         International Prostate Symptom Score   How often have you had the sensation of not emptying your bladder?  Less than half the time     How often have you had to urinate less than every two hours?  About half the time     How often have you found you stopped and started again several times when you urinated?  Less than half the time     How often have you found it difficult to postpone urination?  Less than 1 in 5 times     How often have you had a weak urinary stream?  About half the time     How often have you had to strain to start urination?  Less than half the time     Total IPSS Score  13       Quality of Life due to urinary symptoms   If you were to spend the rest of your life with your urinary condition just the way it is now how would you feel about that?  Mostly Satisfied        Score:  1-7 Mild 8-19 Moderate 20-35 Severe   SHIM    Row Name 08/15/17 1155         SHIM: Over the last 6 months:   How do you rate your confidence that you could get and keep an erection?  Moderate     When you had erections with sexual stimulation, how often were your erections  hard enough for penetration (entering your partner)?  Most Times (much more than half the time)     During sexual intercourse, how often were you able to maintain your erection after you had penetrated (entered) your partner?  Most Times (much more than half the time)     During sexual intercourse, how difficult was it to maintain your erection to completion of intercourse?  Not Difficult     When you attempted sexual intercourse, how often was it satisfactory for you?  Almost Always or Always       SHIM Total Score   SHIM  21         PMH: Past Medical History:  Diagnosis Date  . Elevated PSA 06/07/2017   6.2 -- refer to urologist Sept 2018  . Erectile dysfunction 02/27/2016  . Hypercholesterolemia without hypertriglyceridemia 10/08/2007  . Hypertension   . Malignant hyperthermia 1986   [facility]  . Obesity 02/24/2015  . OSA on CPAP 02/24/2015   CPAP q night   . Osteoarthritis    "  feet and knees"  (10/31/2016)    Surgical History: Past Surgical History:  Procedure Laterality Date  . HAMMER TOE SURGERY Bilateral 11/2015 - 01/2016   "right-left"  . JOINT REPLACEMENT    . SOFT TISSUE BIOPSY Right ~ 1986   "@ [facility]"  . TOTAL KNEE ARTHROPLASTY Right 08/22/2016   Procedure: TOTAL KNEE ARTHROPLASTY;  Surgeon: Loreta Aveaniel F Murphy, MD;  [facility];  Service: Orthopedics;  Laterality: Right;  . TOTAL KNEE ARTHROPLASTY Left 10/31/2016  . TOTAL KNEE ARTHROPLASTY Left 10/31/2016   Procedure: TOTAL KNEE ARTHROPLASTY;  Surgeon: Loreta Aveaniel F Murphy, MD;  [facility];  Service: Orthopedics;  Laterality: Left;    Home Medications:  Allergies as of 08/08/2017   No Known Allergies     Medication List        Accurate as of 08/08/17 11:59 PM. Always use your most recent med list.          amLODipine 5 MG tablet Commonly known as:  NORVASC TAKE 1 AND 1/2 TABLETS BY  MOUTH DAILY   aspirin EC 81 MG tablet Take 1 tablet (81 mg total)  by mouth daily.   Fish Oil 1200 MG Caps Take 1,200 mg by mouth.   losartan-hydrochlorothiazide 100-25 MG tablet Commonly known as:  HYZAAR TAKE 1 TABLET BY MOUTH  DAILY   tadalafil 20 MG tablet Commonly known as:  CIALIS One by mouth every two days if needed prior for intimacy (only for intermittent use)       Allergies: No Known Allergies  Family History: Family History  Problem Relation Age of Onset  . Heart disease Mother   . Stroke Mother   . Suicidality Father   . Cancer Sister        breast    Social History:  reports that  has never smoked. he has never used smokeless tobacco. He reports that he drinks alcohol. He reports that he does not use drugs.  ROS: UROLOGY Frequent Urination?: No Hard to postpone urination?: No Burning/pain with urination?: No Get up at night to urinate?: No Leakage of urine?: No Urine stream starts and stops?: No Trouble starting stream?: No Do you have to strain to urinate?: No Blood in urine?: Yes Urinary tract infection?: No Sexually transmitted disease?: No Injury to kidneys or bladder?: No Painful intercourse?: No Weak stream?: No Erection problems?: No Penile pain?: No  Gastrointestinal Nausea?: No Vomiting?: No Indigestion/heartburn?: No Diarrhea?: No Constipation?: No  Constitutional Fever: No Night sweats?: No Weight loss?: No Fatigue?: No  Skin Skin rash/lesions?: No Itching?: No  Eyes Blurred vision?: No Double vision?: No  Ears/Nose/Throat Sore throat?: No Sinus problems?: No  Hematologic/Lymphatic Swollen glands?: No Easy bruising?: No  Cardiovascular Leg swelling?: No Chest pain?: No  Respiratory Cough?: No Shortness of breath?: No  Endocrine Excessive thirst?: No  Musculoskeletal Back pain?: No Joint pain?: No  Neurological Headaches?: No Dizziness?: No  Psychologic Depression?: No Anxiety?: No  Physical Exam: BP 106/63   Pulse 71   Ht 6\' 2"  (1.88 m)   Wt 273 lb  (123.8 kg)   BMI 35.05 kg/m   Constitutional:  Alert and oriented, No acute distress. HEENT: [place] AT, moist mucus membranes.  Trachea midline, no masses. Cardiovascular: No clubbing, cyanosis, or edema. Respiratory: Normal respiratory effort, no increased work of breathing. Skin: No rashes, bruises or suspicious lesions. Neurologic: Grossly intact, no focal deficits, moving all 4 extremities.  Unsteady gait today ambulating down the hall. Psychiatric: Normal mood and affect.  Laboratory Data: Lab Results  Component Value Date   WBC 9.8 11/01/2016   HGB 13.1 11/01/2016   HCT 39.4 11/01/2016   MCV 89.1 11/01/2016   PLT 235 11/01/2016    Lab Results  Component Value Date   CREATININE 1.00 11/01/2016    Lab Results  Component Value Date   PSA1 6.4 (H) 07/09/2017   PSA1 6.2 (H) 06/06/2017   Urinalysis N/a  Pertinent Imaging: N/a  Assessment & Plan:    1. Elevated PSA Biopsy results reviewed with patient No overt findings of prostate cancer but there was evidence of high-grade PIN as well as one suspicious area which may represent low risk prostate cancer but not diagnostic Reviewed the natural history of prostate cancer At this point, I would recommend surveillance with repeat PSA/DRE in 6 months Strongly consider repeat biopsy at 1 year- patient reports he had a very difficult time with the biopsy.  He may be willing to consider MRI in the future as a compromise.  Return in about 6 months (around 02/05/2018) for PSA/ DRE.  Vanna Scotland, MD  [facility] [address], [address] [place], [place] [phone]

## 2017-08-12 ENCOUNTER — Ambulatory Visit
Admission: RE | Admit: 2017-08-12 | Discharge: 2017-08-12 | Disposition: A | Discharge status: Home / Self Care | Payer: 59 | Source: Ambulatory Visit | Attending: Family Medicine | Admitting: Family Medicine

## 2017-08-12 ENCOUNTER — Ambulatory Visit: Payer: 59 | Admitting: Family Medicine

## 2017-08-12 ENCOUNTER — Encounter: Payer: Self-pay | Admitting: Family Medicine

## 2017-08-12 VITALS — BP 124/70 | HR 64 | Ht 74.0 in | Wt 277.1 lb

## 2017-08-12 DIAGNOSIS — S0990XA Unspecified injury of head, initial encounter: Secondary | ICD-10-CM | POA: Diagnosis not present

## 2017-08-12 DIAGNOSIS — R269 Unspecified abnormalities of gait and mobility: Secondary | ICD-10-CM

## 2017-08-12 DIAGNOSIS — X58XXXA Exposure to other specified factors, initial encounter: Secondary | ICD-10-CM | POA: Diagnosis not present

## 2017-08-12 DIAGNOSIS — R42 Dizziness and giddiness: Secondary | ICD-10-CM | POA: Diagnosis not present

## 2017-08-12 DIAGNOSIS — R29818 Other symptoms and signs involving the nervous system: Secondary | ICD-10-CM | POA: Diagnosis not present

## 2017-08-12 NOTE — Progress Notes (Signed)
Name: Warren [name]   MRN: [id number]    DOB: 09/14/1950   Date:08/12/2017       Progress Note  Subjective  Chief Complaint  Chief Complaint  Patient presents with  . Dizziness    Pt states on x tuesday his garage door hit him on his head and he believes this was the cause of the vertigo, pt states he went to urgent care on Wednesday because he thought the dizziness was from obtaining his ears cleaned or his prostate procedure   . Medication Management    Pt was placed on meclizine and given zofran which he has ran out of the meclizine     HPI  Pt presents for complaint of dizziness and "feeling off balance":  - Had ears lavage 07/03/2017- developed vertigo with right ear being cleaned out, this mostly resolved in a couple of days.  - Had prostate biopsy (07/23/2017) and was told he may have some vertigo, but felt well after the procedure.   - 3-4 days afterward biopsy, he hit his head on his garage door; later in the day he became lightheaded, blood pressure at home was a little on the low side at 110/73.    - The lightheadedness persisted, and he had an episode of nausea and blurred vision on 08/07/2017 -resented to Urgent Care ([facility] on 08/07/2017) and was given Meclizine  And zofran - has been feeling progressively better since then - no more dizziness, vision changes, or nausea.  - He is still feeling "off-balance" which he describes more as lightheadedness. Denies fatigue, extremity weakness, facial droop, confusion, or speech difficulties, no injury aside from hitting head on garage door - no bruising or swelling to head.  HTN: Well controlled, taking daily 81mg  ASA.  Patient Active Problem List   Diagnosis Date Noted  . Preventative health care 07/03/2017  . Elevated PSA 06/07/2017  . Primary localized osteoarthritis of left knee 10/31/2016  . Abnormal EKG 08/06/2016  . Encounter for medication monitoring 05/15/2016  . OA (osteoarthritis) of knee 05/15/2016  .  Erectile dysfunction 02/27/2016  . Status post left foot surgery 01/26/2016  . Hammer toe of left foot 12/25/2015  . Hammertoe 11/29/2015  . Status post right foot surgery 11/29/2015  . Obesity 02/24/2015  . Chondromalacia 02/24/2015  . Obstructive sleep apnea of adult 02/24/2015  . Screening examination for poliomyelitis 02/24/2015  . Acquired acanthosis nigricans 02/01/2010  . Flat foot 07/05/2008  . Allergic rhinitis 01/12/2008  . Decreased libido 01/06/2008  . Hyperlipidemia 10/08/2007  . Benign essential HTN 08/13/2007    Social History   Tobacco Use  . Smoking status: Never Smoker  . Smokeless tobacco: Never Used  Substance Use Topics  . Alcohol use: Yes    Alcohol/week: 0.0 oz    Comment: 10/31/2016 "wine a couple times/month"     Current Outpatient Medications:  .  amLODipine (NORVASC) 5 MG tablet, TAKE 1 AND 1/2 TABLETS BY  MOUTH DAILY, Disp: 135 tablet, Rfl: 2 .  aspirin EC 81 MG tablet, Take 1 tablet (81 mg total) by mouth daily., Disp: , Rfl:  .  losartan-hydrochlorothiazide (HYZAAR) 100-25 MG tablet, TAKE 1 TABLET BY MOUTH  DAILY, Disp: 90 tablet, Rfl: 2 .  Omega-3 Fatty Acids (FISH OIL) 1200 MG CAPS, Take 1,200 mg by mouth., Disp: , Rfl:  .  tadalafil (CIALIS) 20 MG tablet, One by mouth every two days if needed prior for intimacy (only for intermittent use), Disp: 45 tablet, Rfl: 1 .  meclizine (ANTIVERT) 12.5 MG tablet, Take 12.5 mg by mouth 3 (three) times daily as needed for dizziness., Disp: , Rfl:  .  ondansetron (ZOFRAN) 8 MG tablet, Take by mouth every 8 (eight) hours as needed for nausea or vomiting., Disp: , Rfl:   No Known Allergies  ROS  Constitutional: Negative for fever or weight change.  Respiratory: Negative for cough and shortness of breath.   Cardiovascular: Negative for chest pain or palpitations.  Gastrointestinal: Negative for abdominal pain, no bowel changes.  Musculoskeletal: Negative for joint swelling.  Skin: Negative for rash.    Neurological: Endorses very mild headache - says he thinks it is related to worrying about his condition.  See HPI No other specific complaints in a complete review of systems (except as listed in HPI above).  Objective  Vitals:   08/12/17 0858  BP: 124/70  Pulse: 64  SpO2: 98%  Weight: 277 lb 1.6 oz (125.7 kg)  Height: 6\' 2"  (1.88 m)   Body mass index is 35.58 kg/m.  Nursing Note and Vital Signs reviewed.  Physical Exam  Constitutional: Patient appears well-developed and well-nourished. Obese.  No distress.  HEENT: head atraumatic, normocephalic, pupils equal and reactive to light, EOM's intact, TM's without erythema or bulging, neck supple without lymphadenopathy, oropharynx pink and moist without exudate Cardiovascular: Normal rate, regular rhythm, S1/S2 present.  No murmur or rub heard. No BLE edema. Pulmonary/Chest: Effort normal and breath sounds clear. No respiratory distress or retractions. Psychiatric: Patient has a normal mood and affect. behavior is normal. Judgment and thought content normal. Neck: Normal range of motion. Neck supple. No JVD present. No thyromegaly present.  Cardiovascular: Normal rate, regular rhythm and normal heart sounds.  No murmur heard. No BLE edema. Neurological: he is alert and oriented to person, place, and time. No cranial nerve deficit. Coordination, balance, strength, speech and are normal. Heel to toe ambulation is mildly ataxic and patient is unable to complete full length of steps requested. Skin: Skin is warm and dry. No rash noted. No erythema. No ecchymosis or swelling at site of reported head injury.  Assessment & Plan  1. Other symptoms and signs involving the nervous system - MR Brain Wo Contrast; Future 2. Gait disturbance - MR Brain Wo Contrast; Future 3. Dizziness - MR Brain Wo Contrast; Future 4. Traumatic injury of head, initial encounter - MR Brain Wo Contrast; Future  - STAT CT head ordered, Insurance requires  Peer-to-Peer discussion, spoke with Dr. Jonny RuizJohn Huntington with [facility], and he recommends MR Brain Wo Contrast STAT. Order is changed as is reflected above. MRI Scheduled for 6pm today. Discussed acute signs of stroke as below and pt verbalizes understanding of when to present for emergency care. -Red flags and when to present for emergency care or RTC including fever >101.48F, chest pain, shortness of breath, extremity, speech difficulty, confusion, new/worsening/un-resolving symptoms, reviewed with patient at time of visit. Follow up and care instructions discussed and provided in AVS.

## 2017-08-13 ENCOUNTER — Telehealth: Payer: Self-pay

## 2017-08-13 NOTE — Telephone Encounter (Signed)
-----   Message from Doren CustardEmily E Boyce, FNP sent at 08/13/2017  8:06 AM EST ----- Please call Mr. [place] and let him know that he had a normal MRI of the head/brain which is excellent news.  I know that he felt he was getting better each day, but if he continues to feel off-balance over the next few days, he needs to call in and I will refer him to neurology.  Thanks!

## 2017-08-13 NOTE — Telephone Encounter (Signed)
Per the request of Maurice SmallEmily Boyce, FNP, I contacted this patient to review the results from his recent MRI. After he verified his date of birth, results were reviewed.   I then informed him that Irving Burtonmily wants him to give it a few days and if there is no improvement of his symptoms, to give us a call back and we will refer him to neurology. He said ok and thanks.

## 2017-09-05 ENCOUNTER — Encounter: Payer: Self-pay | Admitting: Family Medicine

## 2017-09-05 ENCOUNTER — Ambulatory Visit: Payer: 59 | Admitting: Family Medicine

## 2017-09-05 ENCOUNTER — Other Ambulatory Visit: Payer: Self-pay

## 2017-09-05 DIAGNOSIS — Z6836 Body mass index (BMI) 36.0-36.9, adult: Secondary | ICD-10-CM

## 2017-09-05 DIAGNOSIS — E782 Mixed hyperlipidemia: Secondary | ICD-10-CM

## 2017-09-05 DIAGNOSIS — E6609 Other obesity due to excess calories: Secondary | ICD-10-CM

## 2017-09-05 DIAGNOSIS — I10 Essential (primary) hypertension: Secondary | ICD-10-CM

## 2017-09-05 DIAGNOSIS — N4231 Prostatic intraepithelial neoplasia: Secondary | ICD-10-CM | POA: Diagnosis not present

## 2017-09-05 HISTORY — DX: Prostatic intraepithelial neoplasia: N42.31

## 2017-09-05 MED ORDER — TADALAFIL 20 MG PO TABS
ORAL_TABLET | ORAL | 1 refills | Status: DC
Start: 2017-09-05 — End: 2018-04-28

## 2017-09-05 NOTE — Assessment & Plan Note (Signed)
Patient is motivated and will work on achievable goals over the coming months

## 2017-09-05 NOTE — Assessment & Plan Note (Signed)
Managed by urologist 

## 2017-09-05 NOTE — Patient Instructions (Signed)
Try to limit saturated fats in your diet (bologna, hot dogs, barbeque, cheeseburgers, hamburgers, steak, bacon, sausage, cheese, etc.) and get more fresh fruits, vegetables, and whole grains Check out the information at familydoctor.org entitled "Nutrition for Weight Loss: What You Need to Know about Fad Diets" Try to lose between 1-2 pounds per week by taking in fewer calories and burning off more calories You can succeed by limiting portions, limiting foods dense in calories and fat, becoming more active, and drinking 8 glasses of water a day (64 ounces) Don't skip meals, especially breakfast, as skipping meals may alter your metabolism Do not use over-the-counter weight loss pills or gimmicks that claim rapid weight loss A healthy BMI (or body mass index) is between 18.5 and 24.9 You can calculate your ideal BMI at the NIH website http://www.nhlbi.nih.gov/health/educational/lose_wt/BMI/bmicalc.htm  

## 2017-09-05 NOTE — Assessment & Plan Note (Signed)
Patient really does not want to start cholesterol medicine; he would like to work on weight loss first and then recheck

## 2017-09-05 NOTE — Assessment & Plan Note (Signed)
Well-controlled right now; expect numbers to even improve further; proud of him cutting back on salt

## 2017-09-05 NOTE — Progress Notes (Signed)
BP 126/84 (BP Location: Right Arm, Patient Position: Sitting, Cuff Size: Normal)   Pulse 74   Resp 14   Ht 6\' 2"  (1.88 m)   Wt 281 lb 8 oz (127.7 kg)   SpO2 97%   BMI 36.14 kg/m    Subjective:    Patient ID: Warren [name] [place], male    DOB: 30-Dec-1950, 66 y.o.   MRN: [id number]  HPI: Bates [name] [place] is a 66 y.o. male  Chief Complaint  Patient presents with  . Hypertension    HPI Patient is here for f/u  He had a prostate cancer scare, and everything there came out "okay" per patient; PSA was elevated and he saw urologist; they did a biopsy; reviewed, PIN; that whole process set him back a bit, just worried about that  Then the garage door came down on his head and he suffered a concussion  HTN; both of these numbers are a little high for him He has seen diastolics in the 69-72 range Checks often Not having the spikes any more Limiting salt, cutting back quite a bit  Hx of high TG; they were 369 one year ago and came down; reviewed together LDL was 118 four months ago; his goal was to lose weight; he is coming in after the holidays and says this is not a good time; he does not want medicine for his cholesterol, but wants to lose weight  He would like to try to lose weight; get out on the golf course, go to the gym 3-4 days a week He is going to get back into it; he is behind schedule with his weight goals about a month and a half Thinking about it a little bit at a time; breaking it up into stages that are more manageable  [facility] 2/9 07/03/2017 06/06/2017 10/30/2016 08/06/2016 05/15/2016  Decreased Interest 0 0 0 0 0  Down, Depressed, Hopeless 0 0 0 0 0  PHQ - 2 Score 0 0 0 0 0    Relevant past medical, surgical, family and social history reviewed Past Medical History:  Diagnosis Date  . Elevated PSA 06/07/2017   6.2 -- refer to urologist Sept 2018  . Erectile dysfunction 02/27/2016  . Hypercholesterolemia without hypertriglyceridemia 10/08/2007  .  Hypertension   . Malignant hyperthermia 1986   "[facility]"  . Obesity 02/24/2015  . OSA on CPAP 02/24/2015   CPAP q night   . Osteoarthritis    "feet and knees"  (10/31/2016)  . PIN (prostatic intraepithelial neoplasia) 09/05/2017   Prostate biopsy Nov 2018   Past Surgical History:  Procedure Laterality Date  . HAMMER TOE SURGERY Bilateral 11/2015 - 01/2016   "right-left"  . JOINT REPLACEMENT    . SOFT TISSUE BIOPSY Right ~ 1986   "@ [facility]"  . TOTAL KNEE ARTHROPLASTY Right 08/22/2016   Procedure: TOTAL KNEE ARTHROPLASTY;  Surgeon: Loreta Aveaniel F Murphy, MD;  [facility];  Service: Orthopedics;  Laterality: Right;  . TOTAL KNEE ARTHROPLASTY Left 10/31/2016  . TOTAL KNEE ARTHROPLASTY Left 10/31/2016   Procedure: TOTAL KNEE ARTHROPLASTY;  Surgeon: Loreta Aveaniel F Murphy, MD;  [facility];  Service: Orthopedics;  Laterality: Left;   Family History  Problem Relation Age of Onset  . Heart disease Mother   . Stroke Mother   . Suicidality Father   . Cancer Sister        breast   Social History  Tobacco Use  . Smoking status: Never Smoker  . Smokeless tobacco: Never Used  Substance Use Topics  . Alcohol use: Yes    Alcohol/week: 0.0 oz    Comment: 10/31/2016 "wine a couple times/month"  . Drug use: No   Interim medical history since last visit reviewed. Allergies and medications reviewed  Review of Systems Per HPI unless specifically indicated above     Objective:    BP 126/84 (BP Location: Right Arm, Patient Position: Sitting, Cuff Size: Normal)   Pulse 74   Resp 14   Ht 6\' 2"  (1.88 m)   Wt 281 lb 8 oz (127.7 kg)   SpO2 97%   BMI 36.14 kg/m   Wt Readings from Last 3 Encounters:  09/05/17 281 lb 8 oz (127.7 kg)  08/12/17 277 lb 1.6 oz (125.7 kg)  08/08/17 273 lb (123.8 kg)    Physical Exam  Constitutional: He appears well-developed and well-nourished. No distress.  Obese; weight up 7+ pounds over the last month    HENT:  Head: Normocephalic and atraumatic.  Eyes: EOM are normal. No scleral icterus.  Neck: No thyromegaly present.  Cardiovascular: Normal rate and regular rhythm.  Pulmonary/Chest: Effort normal and breath sounds normal.  Abdominal: Soft. He exhibits no distension.  Musculoskeletal: He exhibits no edema.  Neurological: Coordination normal.  Skin: Skin is warm. No pallor.  Psychiatric: He has a normal mood and affect. His behavior is normal. His mood appears not anxious. He does not exhibit a depressed mood.      Assessment & Plan:   Problem List Items Addressed This Visit      Cardiovascular and Mediastinum   Benign essential HTN    Well-controlled right now; expect numbers to even improve further; proud of him cutting back on salt        Genitourinary   PIN (prostatic intraepithelial neoplasia)    Managed by urologist        Other   Obesity    Patient is motivated and will work on achievable goals over the coming months      Hyperlipidemia    Patient really does not want to start cholesterol medicine; he would like to work on weight loss first and then recheck         Follow up plan: Return in about 3 months (around 12/04/2017) for twenty minute follow-up with fasting labs.  An after-visit summary was printed and given to the patient at check-out.  Please see the patient instructions which may contain other information and recommendations beyond what is mentioned above in the assessment and plan.  No orders of the defined types were placed in this encounter.   No orders of the defined types were placed in this encounter.

## 2017-12-05 ENCOUNTER — Telehealth: Payer: Self-pay | Admitting: Family Medicine

## 2017-12-05 MED ORDER — TELMISARTAN-HCTZ 80-25 MG PO TABS: 1.0000 | ORAL_TABLET | Freq: Every day | ORAL | 0 refills | Status: DC

## 2017-12-05 NOTE — Telephone Encounter (Signed)
Pt notified and verbalized understanding. Nurse visit scheduled for 12/16/17.

## 2017-12-05 NOTE — Telephone Encounter (Signed)
Please let the patient know that his BP medicine was part of the recall We'll have him stop the losartan-HCT and start Micardis-HCT Return for BP check with CMA and BMP about 10 days after the switch If any problems before them, call us

## 2017-12-06 ENCOUNTER — Ambulatory Visit: Payer: 59 | Admitting: Family Medicine

## 2017-12-16 ENCOUNTER — Other Ambulatory Visit: Payer: Self-pay | Admitting: Family Medicine

## 2017-12-16 ENCOUNTER — Ambulatory Visit: Payer: Self-pay

## 2017-12-16 VITALS — BP 136/82 | HR 88

## 2017-12-16 DIAGNOSIS — I10 Essential (primary) hypertension: Secondary | ICD-10-CM

## 2017-12-16 LAB — BASIC METABOLIC PANEL WITH GFR
BUN: 18 mg/dL (ref 7–25)
CO2: 31 mmol/L (ref 20–32)
CREATININE: 1.14 mg/dL (ref 0.70–1.25)
Calcium: 9.9 mg/dL (ref 8.6–10.3)
Chloride: 103 mmol/L (ref 98–110)
GFR, EST NON AFRICAN AMERICAN: 68 mL/min/{1.73_m2} (ref >=60)
GFR, Est African American: 78 mL/min/{1.73_m2} (ref >=60)
Glucose, Bld: 93 mg/dL (ref 65–99)
Potassium: 3.9 mmol/L (ref 3.5–5.3)
Sodium: 140 mmol/L (ref 135–146)

## 2017-12-16 MED ORDER — TELMISARTAN-HCTZ 80-25 MG PO TABS: 1.0000 | ORAL_TABLET | Freq: Every day | ORAL | 5 refills | Status: DC

## 2017-12-16 NOTE — Progress Notes (Signed)
Refill ARB-thiazide combo; BMP normal

## 2018-01-27 ENCOUNTER — Encounter: Payer: Self-pay | Admitting: Family Medicine

## 2018-01-27 ENCOUNTER — Ambulatory Visit (INDEPENDENT_AMBULATORY_CARE_PROVIDER_SITE_OTHER): Payer: Managed Care, Other (non HMO) | Admitting: Family Medicine

## 2018-01-27 VITALS — BP 124/80 | HR 80 | Temp 98.6°F | Resp 14 | Ht 74.0 in | Wt 269.4 lb

## 2018-01-27 DIAGNOSIS — L83 Acanthosis nigricans: Secondary | ICD-10-CM

## 2018-01-27 DIAGNOSIS — Z5181 Encounter for therapeutic drug level monitoring: Secondary | ICD-10-CM

## 2018-01-27 DIAGNOSIS — Z6834 Body mass index (BMI) 34.0-34.9, adult: Secondary | ICD-10-CM | POA: Diagnosis not present

## 2018-01-27 DIAGNOSIS — R739 Hyperglycemia, unspecified: Secondary | ICD-10-CM | POA: Diagnosis not present

## 2018-01-27 DIAGNOSIS — E782 Mixed hyperlipidemia: Secondary | ICD-10-CM

## 2018-01-27 DIAGNOSIS — R972 Elevated prostate specific antigen [PSA]: Secondary | ICD-10-CM

## 2018-01-27 DIAGNOSIS — I10 Essential (primary) hypertension: Secondary | ICD-10-CM | POA: Diagnosis not present

## 2018-01-27 DIAGNOSIS — N4231 Prostatic intraepithelial neoplasia: Secondary | ICD-10-CM | POA: Diagnosis not present

## 2018-01-27 DIAGNOSIS — E6609 Other obesity due to excess calories: Secondary | ICD-10-CM

## 2018-01-27 NOTE — Addendum Note (Signed)
Addended by: Davene Costain on: 01/27/2018 11:28 AM   Modules accepted: Orders

## 2018-01-27 NOTE — Assessment & Plan Note (Signed)
Encouraged health eating, weight loss

## 2018-01-27 NOTE — Assessment & Plan Note (Signed)
Managed by urologist 

## 2018-01-27 NOTE — Assessment & Plan Note (Signed)
Controlled today; try to follow DASH guidelines 

## 2018-01-27 NOTE — Patient Instructions (Addendum)
Keep up the great job with your healthier lifestyle and eating habits We'll get labs today If you have not heard anything from my staff in a week about any orders/referrals/studies from today, please contact us here to follow-up [phone] We'll forward your labs to your urologist, Dr. Apolinar Junes

## 2018-01-27 NOTE — Progress Notes (Signed)
BP 124/80   Pulse 80   Temp 98.6 F (37 C) (Oral)   Resp 14   Ht  (1.88 m)   Wt 269 lb 6.4 oz (122.2 kg)   SpO2 96%   BMI 34.59 kg/m    Subjective:    Patient ID: Warren [name] [place], male    DOB: Nov 12, 1950, 67 y.o.   MRN: [id number]  HPI: Warren [name] [place] is a 67 y.o. male  Chief Complaint  Patient presents with  . Follow-up    HPI Patient is here for follow-up  Hypertension; he is working on weight loss, limiting salt intake; working out; keeping up with his steps, 10-15k steps a day, 18k on days he plays golf  High cholesterol Lab Results  Component Value Date   CHOL 177 04/30/2017   HDL 38 04/30/2017   LDLCALC 118 04/30/2017   TRIG 107 04/30/2017   CHOLHDL 4.7 05/15/2016   High PSA; referred to urologist in Sept 2018; had a prostate biopsy; last PSA climbed from 6.2 to 6.4 (last done 07/09/17) by urologist  Obesity; he is losing a little bit at a time; no weird diet; he is working out and watching what he eats; what really worked for him was our conversation to go in steps; he has a target and then he'll have the next target; he has the 239 pounds written down at home and he is working on this step by step; his wife is motivated too; has a step Chief Technology Officer and FitBit regularly  Depression screen [facility] 2/9 01/27/2018 07/03/2017 06/06/2017 10/30/2016 08/06/2016  Decreased Interest 0 0 0 0 0  Down, Depressed, Hopeless 0 0 0 0 0  PHQ - 2 Score 0 0 0 0 0    Relevant past medical, surgical, family and social history reviewed Past Medical History:  Diagnosis Date  . Elevated PSA 06/07/2017   6.2 -- refer to urologist Sept 2018  . Erectile dysfunction 02/27/2016  . Hypercholesterolemia without hypertriglyceridemia 10/08/2007  . Hypertension   . Malignant hyperthermia 1986   "told I had maybe a mild case of imalignant hyperthermia at [facility]"  . Obesity 02/24/2015  . OSA on CPAP 02/24/2015   CPAP q night   . Osteoarthritis    "feet and knees"  (10/31/2016)  . PIN  (prostatic intraepithelial neoplasia) 09/05/2017   Prostate biopsy Nov 2018   Past Surgical History:  Procedure Laterality Date  . HAMMER TOE SURGERY Bilateral 11/2015 - 01/2016   "right-left"  . JOINT REPLACEMENT    . SOFT TISSUE BIOPSY Right ~ 1986   "@ [facility]"  . TOTAL KNEE ARTHROPLASTY Right 08/22/2016   Procedure: TOTAL KNEE ARTHROPLASTY;  Surgeon: Loreta Ave, MD;  Location: [facility];  Service: Orthopedics;  Laterality: Right;  . TOTAL KNEE ARTHROPLASTY Left 10/31/2016  . TOTAL KNEE ARTHROPLASTY Left 10/31/2016   Procedure: TOTAL KNEE ARTHROPLASTY;  Surgeon: Loreta Ave, MD;  Location: [facility] OR;  Service: Orthopedics;  Laterality: Left;   Family History  Problem Relation Age of Onset  . Heart disease Mother   . Stroke Mother   . Suicidality Father   . Cancer Sister        breast   Social History   Tobacco Use  . Smoking status: Never Smoker  . Smokeless tobacco: Never Used  Substance Use Topics  . Alcohol use: Yes    Alcohol/week: 0.0 oz    Comment: 10/31/2016 "wine a couple times/month"  . Drug use: No  Interim medical history since last visit reviewed. Allergies and medications reviewed  Review of Systems  Constitutional: Negative for unexpected weight change.  Cardiovascular: Negative for chest pain and leg swelling.   Per HPI unless specifically indicated above     Objective:    BP 124/80   Pulse 80   Temp 98.6 F (37 C) (Oral)   Resp 14   Ht  (1.88 m)   Wt 269 lb 6.4 oz (122.2 kg)   SpO2 96%   BMI 34.59 kg/m   Wt Readings from Last 3 Encounters:  01/27/18 269 lb 6.4 oz (122.2 kg)  09/05/17 281 lb 8 oz (127.7 kg)  08/12/17 277 lb 1.6 oz (125.7 kg)    Physical Exam  Constitutional: He appears well-developed and well-nourished. No distress.  HENT:  Head: Normocephalic and atraumatic.  Eyes: EOM are normal. No scleral icterus.  Neck: No thyromegaly present.  Cardiovascular: Normal rate and regular rhythm.  Pulmonary/Chest: Effort normal  and breath sounds normal.  Abdominal: Soft. Bowel sounds are normal. He exhibits no distension.  Musculoskeletal: He exhibits no edema.  Neurological: Coordination normal.  Skin: Skin is warm and dry. No pallor.  Psychiatric: He has a normal mood and affect. His behavior is normal. Judgment and thought content normal.    Results for orders placed or performed in visit on 12/16/17  BASIC METABOLIC PANEL WITH GFR  Result Value Ref Range   Glucose, Bld 93 65 - 99 mg/dL   BUN 18 7 - 25 mg/dL   Creat 4.09 8.11 - 9.14 mg/dL   GFR, Est Non African American 68 > OR = 60 mL/min/1.54m2   GFR, Est African American 78 > OR = 60 mL/min/1.71m2   BUN/Creatinine Ratio NOT APPLICABLE 6 - 22 (calc)   Sodium 140 135 - 146 mmol/[name]   Potassium 3.9 3.5 - 5.3 mmol/[name]   Chloride 103 98 - 110 mmol/[name]   CO2 31 20 - 32 mmol/[name]   Calcium 9.9 8.6 - 10.3 mg/dL      Assessment & Plan:   Problem List Items Addressed This Visit      Cardiovascular and Mediastinum   Benign essential HTN - Primary (Chronic)    Controlled today; try to follow DASH guidelines        Musculoskeletal and Integument   Acquired acanthosis nigricans    Check A1c and glucose today; weight loss encouraged to help prevent progression to type 2 DM        Genitourinary   PIN (prostatic intraepithelial neoplasia)    Managed by urologist      Relevant Orders   PSA     Other   Encounter for medication monitoring   Relevant Orders   COMPLETE METABOLIC PANEL WITH GFR   Elevated PSA   Relevant Orders   PSA   Obesity (Chronic)    Patient has lost 12 pounds since last visit, praise given; see AVS      Hyperlipidemia (Chronic)    Encouraged health eating, weight loss      Relevant Orders   Lipid panel    Other Visit Diagnoses    BMI 34.0-34.9,adult       Hyperglycemia       Relevant Orders   Hemoglobin A1c       Follow up plan: Return in about 6 months (around 07/30/2018) for follow-up visit with Dr. Sherie Don.  An  after-visit summary was printed and given to the patient at check-out.  Please see the patient instructions  which may contain other information and recommendations beyond what is mentioned above in the assessment and plan.  No orders of the defined types were placed in this encounter.   Orders Placed This Encounter  Procedures  . PSA  . Lipid panel  . Hemoglobin A1c  . COMPLETE METABOLIC PANEL WITH GFR

## 2018-01-27 NOTE — Assessment & Plan Note (Signed)
Patient has lost 12 pounds since last visit, praise given; see AVS

## 2018-01-27 NOTE — Assessment & Plan Note (Signed)
Check A1c and glucose today; weight loss encouraged to help prevent progression to type 2 DM

## 2018-01-28 ENCOUNTER — Other Ambulatory Visit: Payer: Self-pay | Admitting: Family Medicine

## 2018-01-28 DIAGNOSIS — R972 Elevated prostate specific antigen [PSA]: Secondary | ICD-10-CM

## 2018-01-28 MED ORDER — TELMISARTAN-HCTZ 80-25 MG PO TABS: 1.0000 | ORAL_TABLET | Freq: Every day | ORAL | 1 refills | Status: DC

## 2018-01-28 NOTE — Telephone Encounter (Signed)
Pt needs a 90 day

## 2018-01-28 NOTE — Telephone Encounter (Signed)
Sent to local pharmacy for 90 days as requested

## 2018-01-28 NOTE — Telephone Encounter (Signed)
Copied from CRM [id number]. Topic: Quick Communication - Rx Refill/Question >> Jan 28, 2018  8:33 AM Eston Mould B wrote: Medication:    telmisartan-hydrochlorothiazide (MICARDIS HCT) 80-25 MG tablet   90 day supply  Has the patient contacted their pharmacy?yes  (Agent: If no, request that the patient contact the pharmacy for the refill.)  (Agent: If yes, when and what did the pharmacy advise?)  Preferred Pharmacy (with phone number or street name): [id number] - [place], [place] - [address]  Agent: Please be advised that RX refills may take up to 3 business days. We ask that you follow-up with your pharmacy.

## 2018-01-29 ENCOUNTER — Other Ambulatory Visit: Payer: Self-pay | Admitting: Family Medicine

## 2018-01-29 DIAGNOSIS — E782 Mixed hyperlipidemia: Secondary | ICD-10-CM

## 2018-01-29 LAB — COMPREHENSIVE METABOLIC PANEL
A/G RATIO: 1.7 (ref 1.2–2.2)
ALBUMIN: 4.3 g/dL (ref 3.6–4.8)
ALT: 18 IU/L (ref 0–44)
AST: 18 IU/L (ref 0–40)
Alkaline Phosphatase: 43 IU/L (ref 39–117)
BUN / CREAT RATIO: 14 (ref 10–24)
BUN: 16 mg/dL (ref 8–27)
Bilirubin Total: 0.7 mg/dL (ref 0.0–1.2)
CALCIUM: 9.3 mg/dL (ref 8.6–10.2)
CO2: 25 mmol/L (ref 20–29)
CREATININE: 1.12 mg/dL (ref 0.76–1.27)
Chloride: 100 mmol/L (ref 96–106)
GFR, EST AFRICAN AMERICAN: 79 mL/min/{1.73_m2} (ref >59)
GFR, EST NON AFRICAN AMERICAN: 69 mL/min/{1.73_m2} (ref >59)
GLOBULIN, TOTAL: 2.5 g/dL (ref 1.5–4.5)
Glucose: 87 mg/dL (ref 65–99)
POTASSIUM: 3.6 mmol/L (ref 3.5–5.2)
SODIUM: 140 mmol/L (ref 134–144)
Total Protein: 6.8 g/dL (ref 6.0–8.5)

## 2018-01-29 LAB — LIPID PANEL
Chol/HDL Ratio: 4.6 ratio (ref 0.0–5.0)
Cholesterol, Total: 170 mg/dL (ref 100–199)
HDL: 37 mg/dL — AB (ref >39)
LDL Calculated: 118 mg/dL — ABNORMAL HIGH (ref 0–99)
TRIGLYCERIDES: 73 mg/dL (ref 0–149)
VLDL CHOLESTEROL CAL: 15 mg/dL (ref 5–40)

## 2018-01-29 LAB — PSA: PROSTATE SPECIFIC AG, SERUM: 6.2 ng/mL — AB (ref 0.0–4.0)

## 2018-01-29 LAB — HEMOGLOBIN A1C
Est. average glucose Bld gHb Est-mCnc: 108 mg/dL
HEMOGLOBIN A1C: 5.4 % (ref 4.8–5.6)

## 2018-01-29 MED ORDER — ATORVASTATIN CALCIUM 20 MG PO TABS: 20.0000 mg | ORAL_TABLET | Freq: Every day | ORAL | 1 refills | Status: DC

## 2018-01-29 NOTE — Progress Notes (Signed)
Start statin; check lipids after July 4th

## 2018-01-31 ENCOUNTER — Other Ambulatory Visit: Payer: 59

## 2018-02-04 ENCOUNTER — Telehealth: Payer: Self-pay | Admitting: Family Medicine

## 2018-02-04 NOTE — Telephone Encounter (Signed)
Called into preferred pharmacy.

## 2018-02-04 NOTE — Telephone Encounter (Signed)
Copied from CRM [id number]. Topic: Quick Communication - Rx Refill/Question >> Feb 04, 2018  8:29 AM Eston Mould B wrote: Medication: pt had refilled but he doesn't want them at that pharmacy he wants to use [facility]  and 90 day supply telmisartan-hydrochlorothiazide (MICARDIS HCT) 80-25 MG tablet   Has the patient contacted their pharmacy? Yes changing pharmacies   (Agent: If no, request that the patient contact the pharmacy for the refill.) (Agent: If yes, when and what did the pharmacy advise?)  Preferred Pharmacy (with phone number or street name):[id number] - [place], [place] - [address] [phone] (Phone) [phone] (Fax)      Agent: Please be advised that RX refills may take up to 3 business days. We ask that you follow-up with your pharmacy.

## 2018-02-05 ENCOUNTER — Ambulatory Visit: Payer: 59 | Admitting: Urology

## 2018-03-01 ENCOUNTER — Other Ambulatory Visit: Payer: Self-pay | Admitting: Family Medicine

## 2018-03-02 NOTE — Telephone Encounter (Signed)
rx denied; new arb combo being used

## 2018-03-12 ENCOUNTER — Encounter: Payer: Self-pay | Admitting: Urology

## 2018-03-12 ENCOUNTER — Ambulatory Visit: Payer: Managed Care, Other (non HMO) | Admitting: Urology

## 2018-03-12 DIAGNOSIS — R972 Elevated prostate specific antigen [PSA]: Secondary | ICD-10-CM | POA: Diagnosis not present

## 2018-03-12 NOTE — Progress Notes (Signed)
03/12/2018 2:47 PM   Warren [name], Warren [name] [id number]  Referring provider: Kerman Passey, MD [address] [place], [place] [address]  Chief Complaint  Patient presents with  . Elevated PSA    51month    HPI: 67 yo M with history of elevated PSA who returns today with for routine follow up.  He was last seen on 07/2017 noted to have an elevated PSA to 6.4.  He underwent prostate biopsy without evidence of malignancy.  There was high-grade PIN with adjacent small atypical glands but not diagnostic for prostate cancer.  TRUS volume 21.6 cc.  Most recent repeat PSA 6.7 on 03/12/2018.  Today, he has minimal urinary symptoms other than nocturia x 2.  He is not bothered by this.     PMH: Past Medical History:  Diagnosis Date  . Elevated PSA 06/07/2017   6.2 -- refer to urologist Sept 2018  . Erectile dysfunction 02/27/2016  . Hypercholesterolemia without hypertriglyceridemia 10/08/2007  . Hypertension   . Malignant hyperthermia 1986   "told I had maybe a mild case of imalignant hyperthermia at [facility]"  . Obesity 02/24/2015  . OSA on CPAP 02/24/2015   CPAP q night   . Osteoarthritis    "feet and knees"  (10/31/2016)  . PIN (prostatic intraepithelial neoplasia) 12/[name]/2018   Prostate biopsy Nov 2018    Surgical History: Past Surgical History:  Procedure Laterality Date  . HAMMER TOE SURGERY Bilateral 11/2015 - 01/2016   "right-left"  . JOINT REPLACEMENT    . SOFT TISSUE BIOPSY Right ~ 1986   "@ [facility]"  . TOTAL KNEE ARTHROPLASTY Right 08/22/2016   Procedure: TOTAL KNEE ARTHROPLASTY;  Surgeon: Loreta Ave, MD;  Location: [facility] OR;  Service: Orthopedics;  Laterality: Right;  . TOTAL KNEE ARTHROPLASTY Left 10/31/2016  . TOTAL KNEE ARTHROPLASTY Left 10/31/2016   Procedure: TOTAL KNEE ARTHROPLASTY;  Surgeon: Loreta Ave, MD;  Location: [facility];  Service: Orthopedics;  Laterality: Left;    Home Medications:  Allergies as of 03/12/2018   No Known Allergies       Medication List        Accurate as of 03/12/18 11:59 PM. Always use your most recent med list.          amLODipine 5 MG tablet Commonly known as:  NORVASC TAKE 1 AND 1/2 TABLETS BY  MOUTH DAILY   aspirin EC 81 MG tablet Take 1 tablet (81 mg total) by mouth daily.   atorvastatin 20 MG tablet Commonly known as:  LIPITOR Take 1 tablet (20 mg total) by mouth at bedtime.   Fish Oil 1200 MG Caps Take 1,200 mg by mouth.   meclizine 12.5 MG tablet Commonly known as:  ANTIVERT Take 12.5 mg by mouth 3 (three) times daily as needed for dizziness.   ondansetron 8 MG tablet Commonly known as:  ZOFRAN Take by mouth every 8 (eight) hours as needed for nausea or vomiting.   tadalafil 20 MG tablet Commonly known as:  CIALIS One by mouth every two days if needed prior for intimacy (only for intermittent use)   telmisartan-hydrochlorothiazide 80-25 MG tablet Commonly known as:  MICARDIS HCT Take 1 tablet by mouth daily. For blood pressure; this replaces losartan-HCT       Allergies: No Known Allergies  Family History: Family History  Problem Relation Age of Onset  . Heart disease Mother   . Stroke Mother   . Suicidality Father   . Cancer Sister  breast    Social History:  reports that he has never smoked. He has never used smokeless tobacco. He reports that he drinks alcohol. He reports that he does not use drugs.  ROS: UROLOGY Frequent Urination?: Yes Hard to postpone urination?: No Burning/pain with urination?: No Get up at night to urinate?: Yes Leakage of urine?: No Urine stream starts and stops?: No Trouble starting stream?: No Do you have to strain to urinate?: No Blood in urine?: No Urinary tract infection?: No Sexually transmitted disease?: No Injury to kidneys or bladder?: No Painful intercourse?: No Weak stream?: No Erection problems?: No Penile pain?: No  Gastrointestinal Nausea?: No Vomiting?: No Indigestion/heartburn?: No Diarrhea?:  No Constipation?: No  Constitutional Fever: No Night sweats?: No Weight loss?: No Fatigue?: No  Skin Skin rash/lesions?: No Itching?: No  Eyes Blurred vision?: No Double vision?: No  Ears/Nose/Throat Sore throat?: No Sinus problems?: No  Hematologic/Lymphatic Swollen glands?: No Easy bruising?: No  Cardiovascular Leg swelling?: No Chest pain?: No  Respiratory Cough?: No Shortness of breath?: No  Endocrine Excessive thirst?: No  Musculoskeletal Back pain?: No Joint pain?: No  Neurological Headaches?: No Dizziness?: No  Psychologic Depression?: No Anxiety?: No  Physical Exam: BP (!) 142/77   Pulse 77   Ht 6\' 2"  (1.88 m)   Wt 269 lb (122 kg)   BMI 34.54 kg/m   Constitutional:  Alert and oriented, No acute distress. HEENT: [place] AT, moist mucus membranes.  Trachea midline, no masses. Cardiovascular: No clubbing, cyanosis, or edema. Respiratory: Normal respiratory effort, no increased work of breathing. Rectal: Normal sphincter tone, enlarged prostate with no nodules, nontender. Skin: No rashes, bruises or suspicious lesions. Neurologic: Grossly intact, no focal deficits, moving all 4 extremities. Psychiatric: Normal mood and affect.  Laboratory Data: Lab Results  Component Value Date   WBC 9.8 11/01/2016   HGB 13.1 11/01/2016   HCT 39.4 11/01/2016   MCV 89.1 11/01/2016   PLT 235 11/01/2016    Lab Results  Component Value Date   CREATININE 1.12 01/28/2018    Component     Latest Ref Rng & Units 9/[name]/2018 07/09/2017 01/28/2018 03/12/2018  Prostate Specific Ag, Serum     0.0 - 4.0 ng/mL 6.2 (H) 6.4 (H) 6.2 (H) 6.7 (H)    Lab Results  Component Value Date   HGBA1C 5.4 01/28/2018    Urinalysis N/A  Pertinent Imaging: N/A  Assessment & Plan:    1. Elevated PSA/ BPH without LUTS Personal history of elevated PSA On exam today, the prostate feels more enlarged than indicated by TRUS volume but otherwise asymptomatic with minimal urinary  symptoms PSA remains relatively stable We will continue to follow closely with serial PSA/rectal exam Recent negative biopsy is reassuring All questions answered - PSA   Return in about 1 year (around 03/13/2019) for PSA/ DRE (lab only in 6 months).  Vanna ScotlandAshley Kailen Name, MD  [facility] [address], [address] [place], [place] [address] [phone]

## 2018-03-13 LAB — PSA: PROSTATE SPECIFIC AG, SERUM: 6.7 ng/mL — AB (ref 0.0–4.0)

## 2018-03-18 IMAGING — MR MR HEAD W/O CM
10 series · 48 of 48 positions shown · non-contrast
Comparison: None.

CLINICAL DATA: Struck on head with garage door 1 week ago. Mild
headache, gait imbalance. Blurry vision has resolved.

EXAM:
MRI HEAD WITHOUT CONTRAST
TECHNIQUE: Multiplanar, multiecho pulse sequences of the brain and surrounding
structures were obtained without intravenous contrast.

[Series 2: T1 · sagittal · 5.0mm · 0.45mm/px · 2 of 29 slices shown (1 of 2)]
[im 1/29]
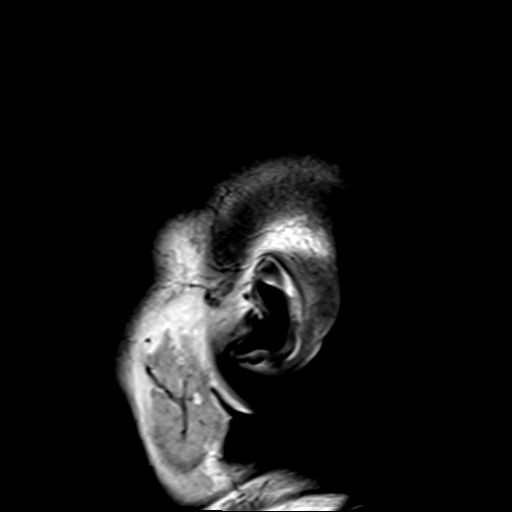
[im 29/29]
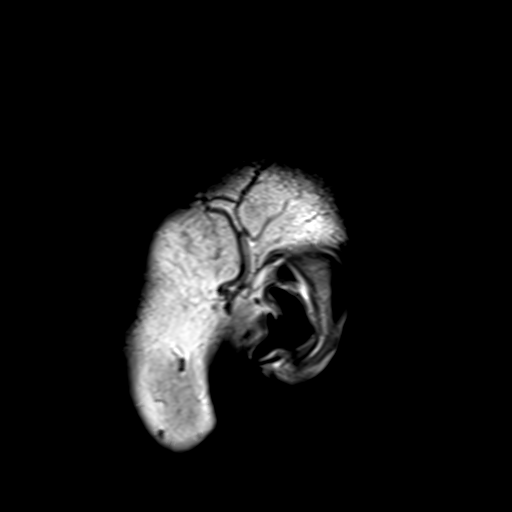

[Series 4: DWI · axial · 3.0mm · 1.80mm/px · z∈[-52,+105]mm · 5 of 55 slices shown (1 of 2)]
[im 1/55]
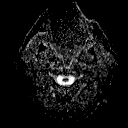
[im 14/55]
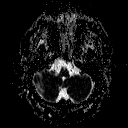
[im 28/55]
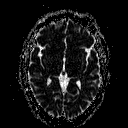
[im 41/55]
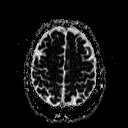
[im 55/55]
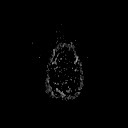

[Series 6: DWI · coronal · 3.0mm · 1.80mm/px · 4 of 49 slices shown (2 of 2)]
[im 1/49]
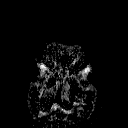
[im 17/49]
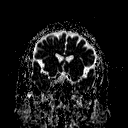
[im 33/49]
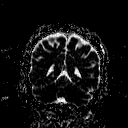
[im 49/49]
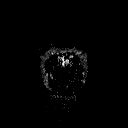

[Series 7: T2 · axial · 5.0mm · 0.60mm/px · z∈[-50,+100]mm · 2 of 25 slices shown (1 of 3)]
[im 1/25]
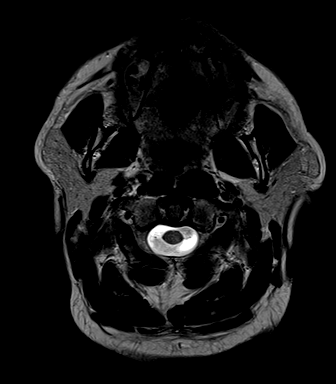
[im 25/25]
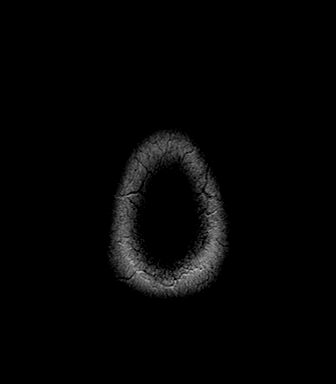

[Series 8: FLAIR · axial · 3.0mm · 0.45mm/px · z∈[-50,+100]mm · 5 of 53 slices shown]
[im 1/53]
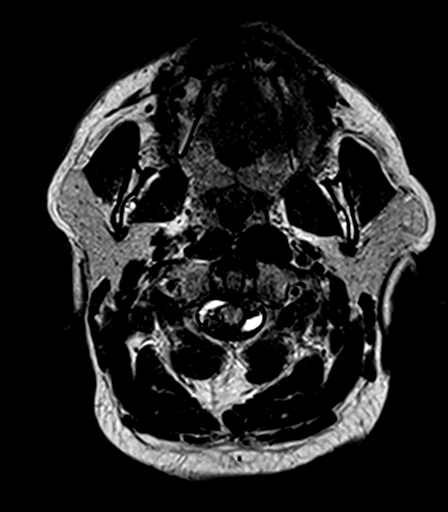
[im 14/53]
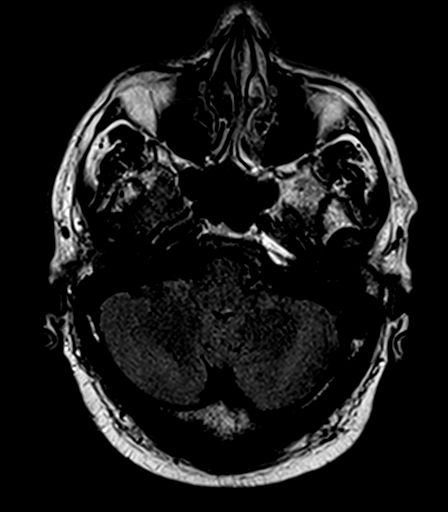
[im 27/53]
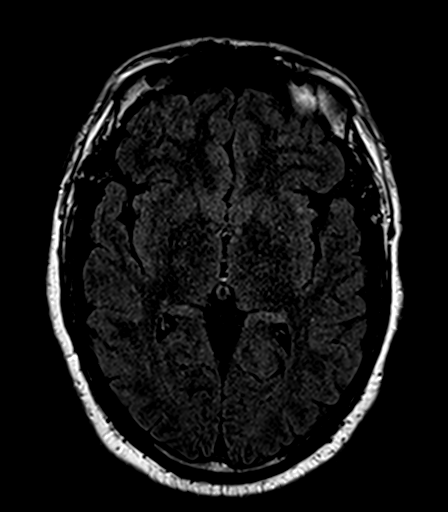
[im 40/53]
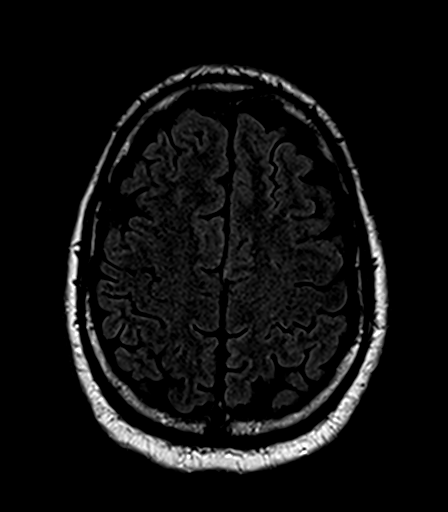
[im 53/53]
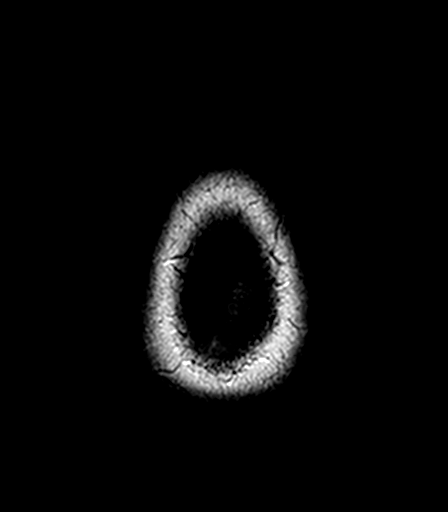

[Series 9: T2 · axial · 5.0mm · 0.45mm/px · z∈[-50,+100]mm · 2 of 25 slices shown (2 of 3)]
[im 1/25]
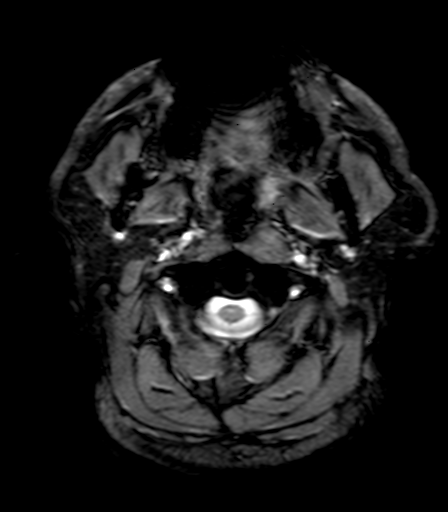
[im 25/25]
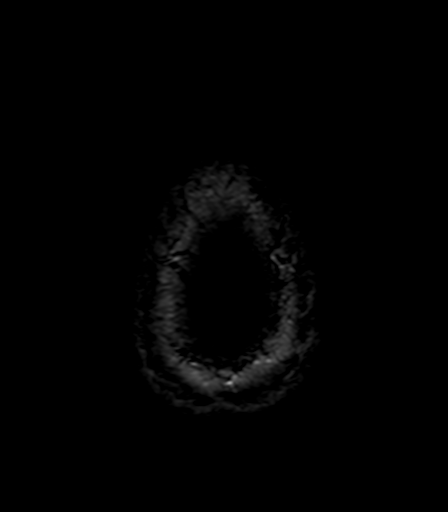

[Series 10: T1 · axial · 1.0mm · 1.00mm/px · z∈[-57,+112]mm · 16 of 176 slices shown (2 of 2)]
[im 1/176]
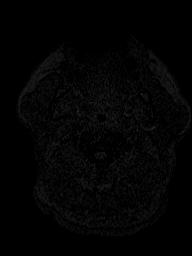
[im 12/176]
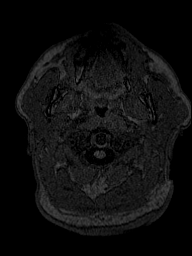
[im 24/176]
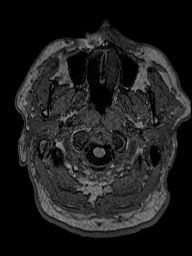
[im 36/176]
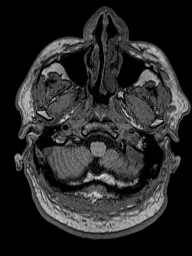
[im 47/176]
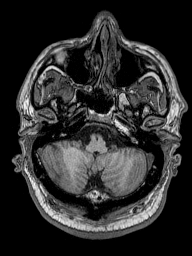
[im 59/176]
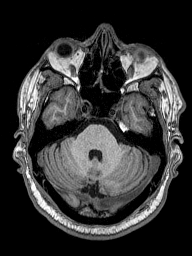
[im 71/176]
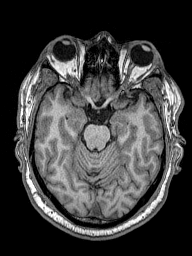
[im 82/176]
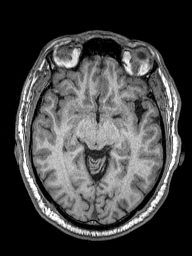
[im 94/176]
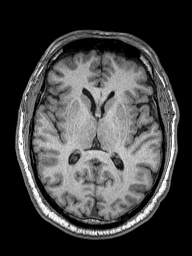
[im 106/176]
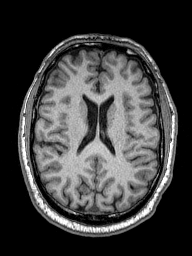
[im 117/176]
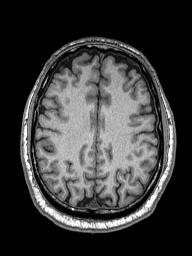
[im 129/176]
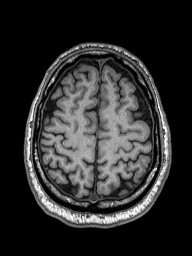
[im 141/176]
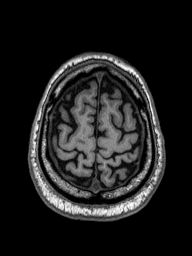
[im 152/176]
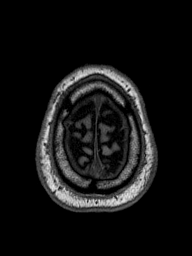
[im 164/176]
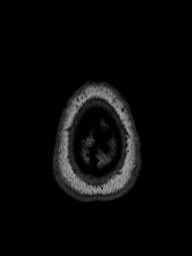
[im 176/176]
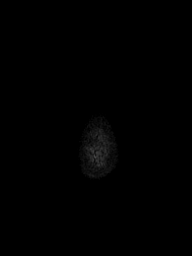

[Series 11: T2 · coronal · 5.0mm · 0.49mm/px · 3 of 31 slices shown (3 of 3)]
[im 1/31]
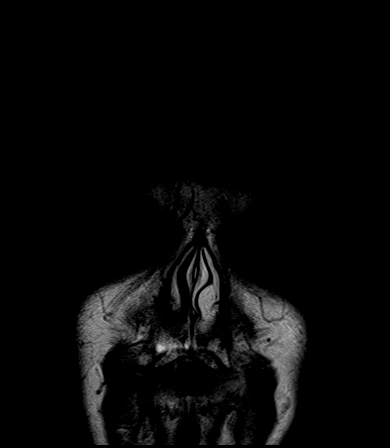
[im 16/31]
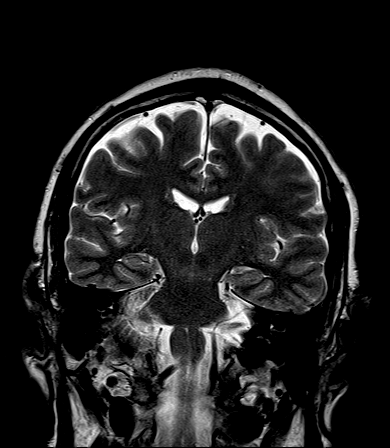
[im 31/31]
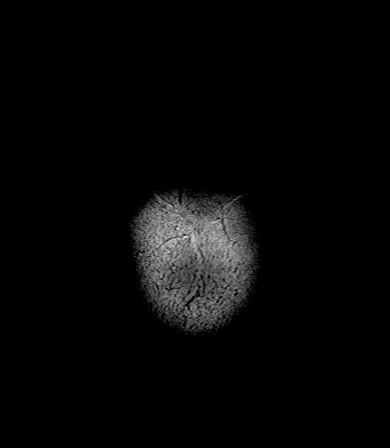

[Series 100: ax (id) · axial · 3.0mm · 1.80mm/px · z∈[-52,+105]mm · 5 of 55 slices shown]
[im 1/55]
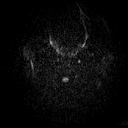
[im 14/55]
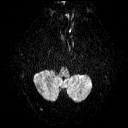
[im 28/55]
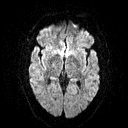
[im 41/55]
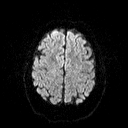
[im 55/55]
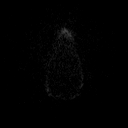

[Series 101: cor (id) · coronal · 3.0mm · 1.80mm/px · 4 of 46 slices shown]
[im 1/46]
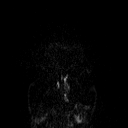
[im 16/46]
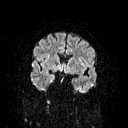
[im 31/46]
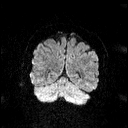
[im 46/46]
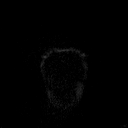

[48 of 48 positions shown; findings below may reference images not displayed]

FINDINGS: BRAIN: No reduced diffusion to suggest acute ischemia. No
susceptibility artifact to suggest hemorrhage. The ventricles and
sulci are normal for patient's age. No suspicious parenchymal
signal, mass or mass effect. No abnormal extra-axial fluid
collections. Small RIGHT parafalcine dural calcification.

VASCULAR: Normal major intracranial vascular flow voids present at
skull base.

SKULL AND UPPER CERVICAL SPINE: No abnormal sellar expansion. No
suspicious calvarial bone marrow signal. Craniocervical junction
maintained.

SINUSES/ORBITS: The mastoid air-cells and included paranasal sinuses
are well-aerated. The included ocular globes and orbital contents
are non-suspicious.

OTHER: None.
IMPRESSION: Normal noncontrast MRI of the head.

## 2018-03-27 ENCOUNTER — Other Ambulatory Visit: Payer: Self-pay | Admitting: Family Medicine

## 2018-04-28 ENCOUNTER — Other Ambulatory Visit: Payer: Self-pay | Admitting: Family Medicine

## 2018-04-28 MED ORDER — TADALAFIL 5 MG PO TABS: 5.0000 mg | ORAL_TABLET | Freq: Every day | ORAL | 1 refills | Status: DC

## 2018-04-28 NOTE — Telephone Encounter (Signed)
I spoke with patient; since he uses the cialis rather frequently, will switch him over to cialis for daily use 5 mg, may have prostate benefits by doing this; he agrees

## 2018-05-01 ENCOUNTER — Other Ambulatory Visit: Payer: Self-pay

## 2018-05-01 MED ORDER — TELMISARTAN-HCTZ 80-25 MG PO TABS: 1.0000 | ORAL_TABLET | Freq: Every day | ORAL | 1 refills | Status: DC

## 2018-05-01 NOTE — Telephone Encounter (Signed)
Last Cr and K+ reviewed; Rx approved 

## 2018-05-27 ENCOUNTER — Encounter: Payer: Self-pay | Admitting: Family Medicine

## 2018-07-06 ENCOUNTER — Other Ambulatory Visit: Payer: Self-pay | Admitting: Family Medicine

## 2018-07-07 ENCOUNTER — Other Ambulatory Visit: Payer: Self-pay | Admitting: Family Medicine

## 2018-07-07 NOTE — Telephone Encounter (Signed)
Rx not due until Jan

## 2018-07-08 ENCOUNTER — Ambulatory Visit (INDEPENDENT_AMBULATORY_CARE_PROVIDER_SITE_OTHER): Payer: Managed Care, Other (non HMO) | Admitting: Nurse Practitioner

## 2018-07-08 ENCOUNTER — Encounter: Payer: Managed Care, Other (non HMO) | Admitting: Family Medicine

## 2018-07-08 ENCOUNTER — Encounter: Payer: Self-pay | Admitting: Nurse Practitioner

## 2018-07-08 ENCOUNTER — Other Ambulatory Visit: Payer: Self-pay | Admitting: Family Medicine

## 2018-07-08 VITALS — BP 120/76 | HR 73 | Temp 98.5°F | Resp 16 | Ht 72.0 in | Wt 269.0 lb

## 2018-07-08 DIAGNOSIS — Z13220 Encounter for screening for lipoid disorders: Secondary | ICD-10-CM

## 2018-07-08 DIAGNOSIS — Z0000 Encounter for general adult medical examination without abnormal findings: Secondary | ICD-10-CM

## 2018-07-08 DIAGNOSIS — Z23 Encounter for immunization: Secondary | ICD-10-CM

## 2018-07-08 LAB — COMPLETE METABOLIC PANEL WITH GFR
AG RATIO: 1.4 (calc) (ref 1.0–2.5)
ALBUMIN MSPROF: 4.3 g/dL (ref 3.6–5.1)
ALKALINE PHOSPHATASE (APISO): 44 U/L (ref 40–115)
ALT: 20 U/L (ref 9–46)
AST: 21 U/L (ref 10–35)
BILIRUBIN TOTAL: 0.7 mg/dL (ref 0.2–1.2)
BUN: 17 mg/dL (ref 7–25)
CALCIUM: 9.6 mg/dL (ref 8.6–10.3)
CHLORIDE: 104 mmol/L (ref 98–110)
CO2: 30 mmol/L (ref 20–32)
Creat: 1.06 mg/dL (ref 0.70–1.25)
GFR, Est African American: 85 mL/min/{1.73_m2} (ref >=60)
GFR, Est Non African American: 73 mL/min/{1.73_m2} (ref >=60)
GLOBULIN: 3 g/dL (ref 1.9–3.7)
Glucose, Bld: 80 mg/dL (ref 65–139)
Potassium: 3.7 mmol/L (ref 3.5–5.3)
Sodium: 141 mmol/L (ref 135–146)
TOTAL PROTEIN: 7.3 g/dL (ref 6.1–8.1)

## 2018-07-08 LAB — LIPID PANEL
CHOLESTEROL: 191 mg/dL (ref <200)
HDL: 37 mg/dL — ABNORMAL LOW (ref >40)
LDL Cholesterol (Calc): 120 mg/dL (calc) — ABNORMAL HIGH
Non-HDL Cholesterol (Calc): 154 mg/dL (calc) — ABNORMAL HIGH (ref <130)
Total CHOL/HDL Ratio: 5.2 (calc) — ABNORMAL HIGH (ref <5.0)
Triglycerides: 225 mg/dL — ABNORMAL HIGH (ref <150)

## 2018-07-08 MED ORDER — TELMISARTAN-HCTZ 80-25 MG PO TABS: 1.0000 | ORAL_TABLET | Freq: Every day | ORAL | 1 refills | Status: DC

## 2018-07-08 NOTE — Telephone Encounter (Signed)
90 day +1 refill provided by PCP Dr. Sherie Don on 05/01/2018 - too soon for refill.

## 2018-07-08 NOTE — Patient Instructions (Signed)
General recommendations: 150 minutes of physical activity weekly, eat two servings of fish weekly, eat one serving of tree nuts ( cashews, pistachios, pecans, almonds..) every other day, eat 6 servings of fruit/vegetables daily and drink plenty of water and avoid sweet beverages.   

## 2018-07-08 NOTE — Telephone Encounter (Signed)
Pt was seen today by Lanora Manis. He is asking that you send telmisartan to [facility]. He is about out

## 2018-07-08 NOTE — Progress Notes (Signed)
Name: Warren [name]   MRN: [id number]    DOB: [birth year]   Date:07/08/2018       Progress Note  Subjective  Chief Complaint  Chief Complaint  Patient presents with  . Annual Exam    HPI  Patient presents for annual CPE .  USPSTF grade A and B recommendations: Patient is not fasting today; ate a granola bar, water.  Diet:  Eating more chicken fish, doing away with pork and beef If he eats fried he has to wait 7 days before he can have fried food at least Loves fruit Gets 4 servings of vegetables a day Drinks water at least 8 glasses; coffee- a cup a day maybe 2  Exercise:  Plays golf 4 times a week for 4 hours- walks 6-7 miles.  Weight equipment in house 4 times a week.   Depression:  Depression screen [facility] 2/9 07/08/2018 01/27/2018 07/03/2017 06/06/2017 10/30/2016  Decreased Interest 0 0 0 0 0  Down, Depressed, Hopeless 0 0 0 0 0  PHQ - 2 Score 0 0 0 0 0    Hypertension:  BP Readings from Last 3 Encounters:  07/08/18 120/76  03/12/18 (!) 142/77  01/27/18 124/80    Obesity: Wt Readings from Last 3 Encounters:  07/08/18 269 lb (122 kg)  03/12/18 269 lb (122 kg)  01/27/18 269 lb 6.4 oz (122.2 kg)   BMI Readings from Last 3 Encounters:  07/08/18 36.48 kg/m  03/12/18 34.54 kg/m  01/27/18 34.59 kg/m     Lipids:  Lab Results  Component Value Date   CHOL 170 01/28/2018   CHOL 177 04/30/2017   CHOL 179 05/15/2016   Lab Results  Component Value Date   HDL 37 (L) 01/28/2018   HDL 38 04/30/2017   HDL 38 (L) 05/15/2016   Lab Results  Component Value Date   LDLCALC 118 (H) 01/28/2018   LDLCALC 118 04/30/2017   LDLCALC 67 05/15/2016   Lab Results  Component Value Date   TRIG 73 01/28/2018   TRIG 107 04/30/2017   TRIG 369 (H) 05/15/2016   Lab Results  Component Value Date   CHOLHDL 4.6 01/28/2018   CHOLHDL 4.7 05/15/2016   No results found for: LDLDIRECT Glucose:  Glucose  Date Value Ref Range Status  01/28/2018 87 65 - 99 mg/dL Final    Glucose, Bld  Date Value Ref Range Status  12/16/2017 93 65 - 99 mg/dL Final    Comment:    .            Fasting reference interval .   11/01/2016 105 (H) 65 - 99 mg/dL Final  40/98/1191 478 (H) 65 - 99 mg/dL Final      [facility] Visit from 07/08/2018 in [facility]  AUDIT-C Score  2      Married; 2 kids   Skin cancer: no personal history, sometimes uses sunscreen Colorectal cancer: last completed 2014; next recommended 2024 Prostate cancer: sees urology who monitors PSA due to elevations, biopsy did not show malignancy.  IPSS Questionnaire (AUA-7): Over the past month.   1)  How often have you had a sensation of not emptying your bladder completely after you finish urinating?  3 - About half the time  2)  How often have you had to urinate again less than two hours after you finished urinating? 3 - About half the time  3)  How often have you found you stopped and started again several times when you urinated?  1 -  Less than 1 time in 5  4) How difficult have you found it to postpone urination?  1 - Less than 1 time in 5  5) How often have you had a weak urinary stream?  4 - More than half the time  6) How often have you had to push or strain to begin urination?  0 - Not at all  7) How many times did you most typically get up to urinate from the time you went to bed until the time you got up in the morning?  1 - 1 time  Total score:  0-7 mildly symptomatic   8-19 moderately symptomatic   20-35 severely symptomatic    Lung cancer:  Low Dose CT Chest recommended if Age 29-80 years, 30 pack-year currently smoking OR have quit w/in 15years. Patient does not qualify.   AAA:  The USPSTF recommends one-time screening with ultrasonography in men ages 5 to 75 years who have ever smoked. Patient does not qualify.  Advanced Care Planning: A voluntary discussion about advance care planning including the explanation and discussion of advance directives.  Discussed  health care proxy and Living will, and the patient was able to identify a health care proxy as wife, Warren [name].  Patient does not have a living will at present time. If patient does have living will, I have requested they bring this to the clinic to be scanned in to their chart.  Patient Active Problem List   Diagnosis Date Noted  . PIN (prostatic intraepithelial neoplasia) 09/05/2017  . Preventative health care 07/03/2017  . Elevated PSA 06/07/2017  . Primary localized osteoarthritis of left knee 10/31/2016  . Abnormal EKG 08/06/2016  . Encounter for medication monitoring 05/15/2016  . OA (osteoarthritis) of knee 05/15/2016  . Erectile dysfunction 02/27/2016  . Status post left foot surgery 01/26/2016  . Hammer toe of left foot 12/25/2015  . Hammertoe 11/29/2015  . Status post right foot surgery 11/29/2015  . Obesity 02/24/2015  . Chondromalacia 02/24/2015  . Obstructive sleep apnea of adult 02/24/2015  . Screening examination for poliomyelitis 02/24/2015  . Acquired acanthosis nigricans 02/01/2010  . Flat foot 07/05/2008  . Allergic rhinitis 01/12/2008  . Decreased libido 01/06/2008  . Hyperlipidemia 10/08/2007  . Benign essential HTN 08/13/2007    Past Surgical History:  Procedure Laterality Date  . HAMMER TOE SURGERY Bilateral 11/2015 - 01/2016   "right-left"  . JOINT REPLACEMENT    . SOFT TISSUE BIOPSY Right ~ 1986   "@ [facility]"  . TOTAL KNEE ARTHROPLASTY Right 08/22/2016   Procedure: TOTAL KNEE ARTHROPLASTY;  Surgeon: Loreta Ave, MD;  Location: [facility] OR;  Service: Orthopedics;  Laterality: Right;  . TOTAL KNEE ARTHROPLASTY Left 10/31/2016  . TOTAL KNEE ARTHROPLASTY Left 10/31/2016   Procedure: TOTAL KNEE ARTHROPLASTY;  Surgeon: Loreta Ave, MD;  Location: [facility] OR;  Service: Orthopedics;  Laterality: Left;    Family History  Problem Relation Age of Onset  . Heart disease Mother   . Stroke Mother   . Suicidality Father   . Cancer Sister        breast     Social History   Socioeconomic History  . Marital status: Married    Spouse name: Not on file  . Number of children: Not on file  . Years of education: Not on file  . Highest education level: Not on file  Occupational History  . Not on file  Social Needs  . Financial resource strain: Not hard at all  .  Food insecurity:    Worry: Never true    Inability: Never true  . Transportation needs:    Medical: No    Non-medical: No  Tobacco Use  . Smoking status: Never Smoker  . Smokeless tobacco: Never Used  Substance and Sexual Activity  . Alcohol use: Yes    Alcohol/week: 0.0 standard drinks    Comment: 10/31/2016 "wine a couple times/month"  . Drug use: No  . Sexual activity: Yes    Partners: Female  Lifestyle  . Physical activity:    Days per week: 4 days    Minutes per session: 50 min  . Stress: Not at all  Relationships  . Social connections:    Talks on phone: More than three times a week    Gets together: More than three times a week    Attends religious service: More than 4 times per year    Active member of club or organization: Yes    Attends meetings of clubs or organizations: More than 4 times per year    Relationship status: Married  . Intimate partner violence:    Fear of current or ex partner: No    Emotionally abused: No    Physically abused: No    Forced sexual activity: No  Other Topics Concern  . Not on file  Social History Narrative  . Not on file     Current Outpatient Medications:  .  amLODipine (NORVASC) 5 MG tablet, TAKE 1 AND 1/2 TABLETS BY  MOUTH DAILY, Disp: 135 tablet, Rfl: 1 .  aspirin EC 81 MG tablet, Take 1 tablet (81 mg total) by mouth daily., Disp: , Rfl:  .  Omega-3 Fatty Acids (FISH OIL) 1200 MG CAPS, Take 1,200 mg by mouth., Disp: , Rfl:  .  tadalafil (CIALIS) 5 MG tablet, Take 1 tablet (5 mg total) by mouth daily., Disp: 90 tablet, Rfl: 1 .  telmisartan-hydrochlorothiazide (MICARDIS HCT) 80-25 MG tablet, Take 1 tablet by  mouth daily. For blood pressure; this replaces losartan-HCT, Disp: 90 tablet, Rfl: 1  No Known Allergies   Review of Systems  Constitutional: Negative for chills, fever and malaise/fatigue.  HENT: Negative for congestion, sinus pain and sore throat.   Eyes: Negative for blurred vision and double vision.  Respiratory: Negative for cough and shortness of breath.   Cardiovascular: Negative for chest pain, palpitations and leg swelling.  Gastrointestinal: Negative for abdominal pain, blood in stool, constipation, diarrhea and nausea.  Genitourinary: Positive for frequency (is on diuretic). Negative for dysuria and hematuria.  Musculoskeletal: Negative for falls and joint pain.  Skin: Negative for rash.  Neurological: Negative for dizziness, weakness and headaches.  Endo/Heme/Allergies: Negative for polydipsia.  Psychiatric/Behavioral: Negative for depression. The patient is not nervous/anxious and does not have insomnia.       Objective  Vitals:   07/08/18 0844  BP: 120/76  Pulse: 73  Resp: 16  Temp: 98.5 F (36.9 C)  TempSrc: Oral  SpO2: 96%  Weight: 269 lb (122 kg)  Height: 6' (1.829 m)    Body mass index is 36.48 kg/m.  Physical Exam  Skin:      Constitutional: Patient appears well-developed and well-nourished. No distress.  HENT: Head: Normocephalic and atraumatic. Ears: B TMs ok, no erythema or effusion; Nose: Nose normal. Mouth/Throat: Oropharynx is clear and moist. No oropharyngeal exudate.  Eyes: Conjunctivae and EOM are normal. Pupils are equal, round, and reactive to light. No scleral icterus.  Neck: Normal range of motion. Neck supple. No  JVD present. No thyromegaly present.  Cardiovascular: Normal rate, regular rhythm and normal heart sounds.  No murmur heard. No BLE edema. Pulmonary/Chest: Effort normal and breath sounds normal. No respiratory distress. Abdominal: Soft. Bowel sounds are normal, no distension. There is no tenderness. no masses MALE  GENITALIA: deferred, patient preference  Musculoskeletal: Normal range of motion, no joint effusions. No gross deformities Neurological: he is alert and oriented to person, place, and time. No cranial nerve deficit. Coordination, balance, strength, speech and gait are normal.  Skin: Skin is warm and dry. No rash noted. No erythema.  Psychiatric: Patient has a normal mood and affect. behavior is normal. Judgment and thought content normal.   No results found for this or any previous visit (from the past 2160 hour(s)).   PHQ2/9: Depression screen [facility] 2/9 07/08/2018 01/27/2018 07/03/2017 06/06/2017 10/30/2016  Decreased Interest 0 0 0 0 0  Down, Depressed, Hopeless 0 0 0 0 0  PHQ - 2 Score 0 0 0 0 0     Fall Risk: Fall Risk  07/08/2018 01/27/2018 09/05/2017 07/03/2017 06/06/2017  Falls in the past year? No No No No No      Functional Status Survey: Is the patient deaf or have difficulty hearing?: No Does the patient have difficulty concentrating, remembering, or making decisions?: No Does the patient have difficulty walking or climbing stairs?: No Does the patient have difficulty dressing or bathing?: No Does the patient have difficulty doing errands alone such as visiting a doctor's office or shopping?: No   Assessment & Plan  1. Annual physical exam Non-fasting today; discussed healthy living goals, moisturizer for atopic dermatitis areas - Lipid Profile - COMPLETE METABOLIC PANEL WITH GFR  2. Flu vaccine need - Flu vaccine HIGH DOSE PF  3. Screening for lipid disorders - Lipid Profile   -Prostate cancer screening and PSA options (with potential risks and benefits of testing vs not testing) were discussed along with recent recs/guidelines. -USPSTF grade A and B recommendations reviewed with patient; age-appropriate recommendations, preventive care, screening tests, etc discussed and encouraged; healthy living encouraged; see AVS for patient education given to  patient -Discussed importance of 150 minutes of physical activity weekly, eat two servings of fish weekly, eat one serving of tree nuts ( cashews, pistachios, pecans, almonds.Marland Kitchen) every other day, eat 6 servings of fruit/vegetables daily and drink plenty of water and avoid sweet beverages.  -Reviewed Health Maintenance: flu shot given

## 2018-07-09 ENCOUNTER — Other Ambulatory Visit: Payer: Self-pay | Admitting: Nurse Practitioner

## 2018-07-09 DIAGNOSIS — E785 Hyperlipidemia, unspecified: Secondary | ICD-10-CM

## 2018-07-09 MED ORDER — ATORVASTATIN CALCIUM 20 MG PO TABS: 20.0000 mg | ORAL_TABLET | Freq: Every day | ORAL | 0 refills | Status: DC

## 2018-07-29 ENCOUNTER — Other Ambulatory Visit: Payer: Self-pay | Admitting: Family Medicine

## 2018-07-30 ENCOUNTER — Ambulatory Visit: Payer: Managed Care, Other (non HMO) | Admitting: Family Medicine

## 2018-07-30 ENCOUNTER — Other Ambulatory Visit: Payer: Self-pay | Admitting: Family Medicine

## 2018-07-30 ENCOUNTER — Encounter: Payer: Self-pay | Admitting: Family Medicine

## 2018-07-30 DIAGNOSIS — I10 Essential (primary) hypertension: Secondary | ICD-10-CM

## 2018-07-30 DIAGNOSIS — E785 Hyperlipidemia, unspecified: Secondary | ICD-10-CM

## 2018-07-30 DIAGNOSIS — E6609 Other obesity due to excess calories: Secondary | ICD-10-CM | POA: Diagnosis not present

## 2018-07-30 DIAGNOSIS — Z6834 Body mass index (BMI) 34.0-34.9, adult: Secondary | ICD-10-CM

## 2018-07-30 DIAGNOSIS — E782 Mixed hyperlipidemia: Secondary | ICD-10-CM

## 2018-07-30 DIAGNOSIS — R972 Elevated prostate specific antigen [PSA]: Secondary | ICD-10-CM

## 2018-07-30 LAB — LIPID PANEL
CHOL/HDL RATIO: 5 (calc) — AB (ref <5.0)
Cholesterol: 191 mg/dL (ref <200)
HDL: 38 mg/dL — AB (ref >40)
LDL CHOLESTEROL (CALC): 128 mg/dL — AB
NON-HDL CHOLESTEROL (CALC): 153 mg/dL — AB (ref <130)
TRIGLYCERIDES: 132 mg/dL (ref <150)

## 2018-07-30 MED ORDER — TELMISARTAN-HCTZ 80-25 MG PO TABS: 1.0000 | ORAL_TABLET | Freq: Every day | ORAL | 1 refills | Status: DC

## 2018-07-30 NOTE — Assessment & Plan Note (Signed)
Well-controlled; so glad that the BP is stable; continue the current medicines

## 2018-07-30 NOTE — Assessment & Plan Note (Signed)
BMI > 35 with HTN, high cholesterol; his next goal is 250 pounds; encouragement given; so glad he is active and motivated

## 2018-07-30 NOTE — Telephone Encounter (Signed)
Copied from CRM [phone]. Topic: Quick Communication - Rx Refill/Question >> Jul 30, 2018 10:19 AM Fanny BienIlderton, Jessica L wrote: Medication: telmisartan-hydrochlorothiazide (MICARDIS HCT) 80-25 MG tablet [[id number]][[id number]]  pt called and stated that mail service is out. Wants this sent locally.   Has the patient contacted their pharmacy? Yes Preferred Pharmacy (with phone number or street name[id number] - [place] - [address]  Agent: Please be advised that RX refills may take up to 3 business days. We ask that you follow-up with your pharmacy.

## 2018-07-30 NOTE — Progress Notes (Signed)
BP 130/78   Pulse 65   Temp 98 F (36.7 C)   Ht 6' (1.829 m)   Wt 271 lb 11.2 oz (123.2 kg)   SpO2 98%   BMI 36.85 kg/m    Subjective:    Patient ID: Warren [name] [place], male    DOB: 1951/09/09, 67 y.o.   MRN: [id number]  HPI: Warren [name] [place] is a 67 y.o. male  Chief Complaint  Patient presents with  . Follow-up    HPI Here for f/u  HTN; this year, his BP has been more consistent than he has seen for years; his BP is much more level; most of the time, 120s systolic; never sees the 140 and 150 readings any more; diastolic is usually low 70s; checks BP at home; doing a good job staying away from salt; stopped shaking salt; still has room for improvement with peanuts and popcorn; trying to work on that  High cholesterol; not taking on statin; he says "I should, but I'm not"; he wants to see how he can do without the medicine; staying away from fried foods; more salmon; less beef and pork, cutting back on those; gets Cheerios, little individual packs and has the little pack at the golf course; golf pro at [employer] Results  Component Value Date   CHOL 191 07/08/2018   HDL 37 ([name]) 07/08/2018   LDLCALC 120 (H) 07/08/2018   TRIG 225 (H) 07/08/2018   CHOLHDL 5.2 (H) 07/08/2018    Morbid obesity; BMI >35 with HTN and high chol; he was up to 305 pounds; he was 269 pounds the last time; he is motivated to lose again and keep trending down; his next goal is 250 pounds  Elevated PSA; managed by urology; last 4 readings have been over 6  Depression screen [facility] 2/9 07/30/2018 07/08/2018 01/27/2018 07/03/2017 06/06/2017  Decreased Interest 0 0 0 0 0  Down, Depressed, Hopeless 0 0 0 0 0  PHQ - 2 Score 0 0 0 0 0  Altered sleeping 0 - - - -  Tired, decreased energy 0 - - - -  Change in appetite 0 - - - -  Feeling bad or failure about yourself  0 - - - -  Trouble concentrating 0 - - - -  Moving slowly or fidgety/restless 0 - - - -  Suicidal thoughts 0 - - - -  PHQ-9 Score 0 - - - -   Difficult doing work/chores Not difficult at all - - - -   Fall Risk  07/30/2018 07/08/2018 01/27/2018 09/05/2017 07/03/2017  Falls in the past year? 0 No No No No    Relevant past medical, surgical, family and social history reviewed Past Medical History:  Diagnosis Date  . Elevated PSA 06/07/2017   6.2 -- refer to urologist Sept 2018  . Erectile dysfunction 02/27/2016  . Hypercholesterolemia without hypertriglyceridemia 10/08/2007  . Hypertension   . Malignant hyperthermia 1986   "told I had maybe a mild case of imalignant hyperthermia at [facility]"  . Obesity 02/24/2015  . OSA on CPAP 02/24/2015   CPAP q night   . Osteoarthritis    "feet and knees"  (10/31/2016)  . PIN (prostatic intraepithelial neoplasia) 09/05/2017   Prostate biopsy Nov 2018   Past Surgical History:  Procedure Laterality Date  . HAMMER TOE SURGERY Bilateral 11/2015 - 01/2016   "right-left"  . JOINT REPLACEMENT    . SOFT TISSUE BIOPSY Right ~ 1986   "@ [facility]"  .  TOTAL KNEE ARTHROPLASTY Right 08/22/2016   Procedure: TOTAL KNEE ARTHROPLASTY;  Surgeon: Loreta Aveaniel F Murphy, MD;  [facility];  Service: Orthopedics;  Laterality: Right;  . TOTAL KNEE ARTHROPLASTY Left 10/31/2016  . TOTAL KNEE ARTHROPLASTY Left 10/31/2016   Procedure: TOTAL KNEE ARTHROPLASTY;  Surgeon: Loreta Aveaniel F Murphy, MD;  [facility];  Service: Orthopedics;  Laterality: Left;   Family History  Problem Relation Age of Onset  . Heart disease Mother   . Stroke Mother   . Suicidality Father   . Cancer Sister        breast   Social History   Tobacco Use  . Smoking status: Never Smoker  . Smokeless tobacco: Never Used  Substance Use Topics  . Alcohol use: Yes    Alcohol/week: 0.0 standard drinks    Comment: 10/31/2016 "wine a couple times/month"  . Drug use: No     [facility]  AUDIT-C Score  0      Interim medical history since last visit reviewed. Allergies and medications  reviewed  Review of Systems Per HPI unless specifically indicated above     Objective:    BP 130/78   Pulse 65   Temp 98 F (36.7 C)   Ht 6' (1.829 m)   Wt 271 lb 11.2 oz (123.2 kg)   SpO2 98%   BMI 36.85 kg/m   Wt Readings from Last 3 Encounters:  07/30/18 271 lb 11.2 oz (123.2 kg)  07/08/18 269 lb (122 kg)  03/12/18 269 lb (122 kg)    Physical Exam  Constitutional: He appears well-developed and well-nourished. No distress.  HENT:  Head: Normocephalic and atraumatic.  Eyes: EOM are normal. No scleral icterus.  Neck: No thyromegaly present.  Cardiovascular: Normal rate and regular rhythm.  Pulmonary/Chest: Effort normal and breath sounds normal.  Abdominal: Soft. Bowel sounds are normal. He exhibits no distension.  Musculoskeletal: He exhibits no edema.  Neurological: Coordination normal.  Skin: Skin is warm and dry. No pallor.  Psychiatric: He has a normal mood and affect. His behavior is normal. Judgment and thought content normal.    Results for orders placed or performed in visit on 07/08/18  Lipid Profile  Result Value Ref Range   Cholesterol 191 <200 mg/dL   HDL 37 ([name]) >53>40 mg/dL   Triglycerides 299225 (H) <150 mg/dL   LDL Cholesterol (Calc) 120 (H) mg/dL (calc)   Total CHOL/HDL Ratio 5.2 (H) <5.0 (calc)   Non-HDL Cholesterol (Calc) 154 (H) <130 mg/dL (calc)  COMPLETE METABOLIC PANEL WITH GFR  Result Value Ref Range   Glucose, Bld 80 65 - 139 mg/dL   BUN 17 7 - 25 mg/dL   Creat 2.421.06 6.830.70 - 4.191.25 mg/dL   GFR, Est Non African American 73 > OR = 60 mL/min/1.5873m2   GFR, Est African American 85 > OR = 60 mL/min/1.7473m2   BUN/Creatinine Ratio NOT APPLICABLE 6 - 22 (calc)   Sodium 141 135 - 146 mmol/[name]   Potassium 3.7 3.5 - 5.3 mmol/[name]   Chloride 104 98 - 110 mmol/[name]   CO2 30 20 - 32 mmol/[name]   Calcium 9.6 8.6 - 10.3 mg/dL   Total Protein 7.3 6.1 - 8.1 g/dL   Albumin 4.3 3.6 - 5.1 g/dL   Globulin 3.0 1.9 - 3.7 g/dL (calc)   AG Ratio 1.4 1.0 - 2.5 (calc)   Total  Bilirubin 0.7 0.2 - 1.2 mg/dL   Alkaline phosphatase (APISO) 44 40 - 115  U/[name]   AST 21 10 - 35 U/[name]   ALT 20 9 - 46 U/[name]      Assessment & Plan:   Problem List Items Addressed This Visit      Cardiovascular and Mediastinum   Benign essential HTN (Chronic)    Well-controlled; so glad that the BP is stable; continue the current medicines        Other   RESOLVED: Obesity (Chronic)    Encouragement given to lose weight      Morbid obesity (HCC)    BMI > 35 with HTN, high cholesterol; his next goal is 250 pounds; encouragement given; so glad he is active and motivated      Hyperlipidemia (Chronic)    Patient has really cut back on pork and beef; wishes to see how he has does with diet and lifestyle changes; he is active with golfing      Elevated PSA    Managed by urologist          Follow up plan: Return in about 3 months (around 10/30/2018).  An after-visit summary was printed and given to the patient at check-out.  Please see the patient instructions which may contain other information and recommendations beyond what is mentioned above in the assessment and plan.  No orders of the defined types were placed in this encounter.   No orders of the defined types were placed in this encounter.

## 2018-07-30 NOTE — Assessment & Plan Note (Signed)
Encouragement given to lose weight 

## 2018-07-30 NOTE — Telephone Encounter (Signed)
Patient wants 90 day sent to local pharmacy because mail order has on back order

## 2018-07-30 NOTE — Assessment & Plan Note (Signed)
Managed by urologist 

## 2018-07-30 NOTE — Patient Instructions (Addendum)
Try to follow the DASH guidelines (DASH stands for Dietary Approaches to Stop Hypertension). Try to limit the sodium in your diet to no more than 1,500mg  of sodium per day. Certainly try to not exceed 2,000 mg per day at the very most. Do not add salt when cooking or at the table.  Check the sodium amount on labels when shopping, and choose items lower in sodium when given a choice. Avoid or limit foods that already contain a lot of sodium. Eat a diet rich in fruits and vegetables and whole grains, and try to lose weight if overweight or obese  Check out the information at familydoctor.org entitled "Nutrition for Weight Loss: What You Need to Know about Fad Diets" Try to lose between 1-2 pounds per week by taking in fewer calories and burning off more calories You can succeed by limiting portions, limiting foods dense in calories and fat, becoming more active, and drinking 8 glasses of water a day (64 ounces) Don't skip meals, especially breakfast, as skipping meals may alter your metabolism Do not use over-the-counter weight loss pills or gimmicks that claim rapid weight loss A healthy BMI (or body mass index) is between 18.5 and 24.9 You can calculate your ideal BMI at the [employer] website ClubMonetize.fr   Obesity, Adult Obesity is the condition of having too much total body fat. Being overweight or obese means that your weight is greater than what is considered healthy for your body size. Obesity is determined by a measurement called BMI. BMI is an estimate of body fat and is calculated from height and weight. For adults, a BMI of 30 or higher is considered obese. Obesity can eventually lead to other health concerns and major illnesses, including:  Stroke.  Coronary artery disease (CAD).  Type 2 diabetes.  Some types of cancer, including cancers of the colon, breast, uterus, and gallbladder.  Osteoarthritis.  High blood pressure  (hypertension).  High cholesterol.  Sleep apnea.  Gallbladder stones.  Infertility problems.  What are the causes? The main cause of obesity is taking in (consuming) more calories than your body uses for energy. Other factors that contribute to this condition may include:  Being born with genes that make you more likely to become obese.  Having a medical condition that causes obesity. These conditions include: ? Hypothyroidism. ? Polycystic ovarian syndrome (PCOS). ? Binge-eating disorder. ? Cushing syndrome.  Taking certain medicines, such as steroids, antidepressants, and seizure medicines.  Not being physically active (sedentary lifestyle).  Living where there are limited places to exercise safely or buy healthy foods.  Not getting enough sleep.  What increases the risk? The following factors may increase your risk of this condition:  Having a family history of obesity.  Being a woman of African-American descent.  Being a man of Hispanic descent.  What are the signs or symptoms? Having excessive body fat is the main symptom of this condition. How is this diagnosed? This condition may be diagnosed based on:  Your symptoms.  Your medical history.  A physical exam. Your health care provider may measure: ? Your BMI. If you are an adult with a BMI between 25 and less than 30, you are considered overweight. If you are an adult with a BMI of 30 or higher, you are considered obese. ? The distances around your hips and your waist (circumferences). These may be compared to each other to help diagnose your condition. ? Your skinfold thickness. Your health care provider may gently pinch a fold  of your skin and measure it.  How is this treated? Treatment for this condition often includes changing your lifestyle. Treatment may include some or all of the following:  Dietary changes. Work with your health care provider and a dietitian to set a weight-loss goal that is  healthy and reasonable for you. Dietary changes may include eating: ? Smaller portions. A portion size is the amount of a particular food that is healthy for you to eat at one time. This varies from person to person. ? Low-calorie or low-fat options. ? More whole grains, fruits, and vegetables.  Regular physical activity. This may include aerobic activity (cardio) and strength training.  Medicine to help you lose weight. Your health care provider may prescribe medicine if you are unable to lose 1 pound a week after 6 weeks of eating more healthily and doing more physical activity.  Surgery. Surgical options may include gastric banding and gastric bypass. Surgery may be done if: ? Other treatments have not helped to improve your condition. ? You have a BMI of 40 or higher. ? You have life-threatening health problems related to obesity.  Follow these instructions at home:  Eating and drinking   Follow recommendations from your health care provider about what you eat and drink. Your health care provider may advise you to: ? Limit fast foods, sweets, and processed snack foods. ? Choose low-fat options, such as low-fat milk instead of whole milk. ? Eat 5 or more servings of fruits or vegetables every day. ? Eat at home more often. This gives you more control over what you eat. ? Choose healthy foods when you eat out. ? Learn what a healthy portion size is. ? Keep low-fat snacks on hand. ? Avoid sugary drinks, such as soda, fruit juice, iced tea sweetened with sugar, and flavored milk. ? Eat a healthy breakfast.  Drink enough water to keep your urine clear or pale yellow.  Do not go without eating for long periods of time (do not fast) or follow a fad diet. Fasting and fad diets can be unhealthy and even dangerous. Physical Activity  Exercise regularly, as told by your health care provider. Ask your health care provider what types of exercise are safe for you and how often you should  exercise.  Warm up and stretch before being active.  Cool down and stretch after being active.  Rest between periods of activity. Lifestyle  Limit the time that you spend in front of your TV, computer, or video game system.  Find ways to reward yourself that do not involve food.  Limit alcohol intake to no more than 1 drink a day for nonpregnant women and 2 drinks a day for men. One drink equals 12 oz of beer, 5 oz of wine, or 1 oz of hard liquor. General instructions  Keep a weight loss journal to keep track of the food you eat and how much you exercise you get.  Take over-the-counter and prescription medicines only as told by your health care provider.  Take vitamins and supplements only as told by your health care provider.  Consider joining a support group. Your health care provider may be able to recommend a support group.  Keep all follow-up visits as told by your health care provider. This is important. Contact a health care provider if:  You are unable to meet your weight loss goal after 6 weeks of dietary and lifestyle changes. This information is not intended to replace advice given to you by  your health care provider. Make sure you discuss any questions you have with your health care provider. Document Released: 10/04/2004 Document Revised: 01/30/2016 Document Reviewed: 06/15/2015 Elsevier Interactive Patient Education  2018 Elsevier Inc.  Preventing Unhealthy [employer], Adult Staying at a healthy weight is important. When fat builds up in your body, you may become overweight or obese. These conditions put you at greater risk for developing certain health problems, such as heart disease, diabetes, sleeping problems, joint problems, and some cancers. Unhealthy weight gain is often the result of making unhealthy choices in what you eat. It is also a result of not getting enough exercise. You can make changes to your lifestyle to prevent obesity and stay as healthy as  possible. What nutrition changes can be made? To maintain a healthy weight and prevent obesity:  Eat only as much as your body needs. To do this: ? Pay attention to signs that you are hungry or full. Stop eating as soon as you feel full. ? If you feel hungry, try drinking water first. Drink enough water so your urine is clear or pale yellow. ? Eat smaller portions. ? Look at serving sizes on food labels. Most foods contain more than one serving per container. ? Eat the recommended amount of calories for your gender and activity level. While most active people should eat around 2,000 calories per day, if you are trying to lose weight or are not very active, you main need to eat less calories. Talk to your health care provider or dietitian about how many calories you should eat each day.  Choose healthy foods, such as: ? Fruits and vegetables. Try to fill at least half of your plate at each meal with fruits and vegetables. ? Whole grains, such as whole wheat bread, brown rice, and quinoa. ? Lean meats, such as chicken or fish. ? Other healthy proteins, such as beans, eggs, or tofu. ? Healthy fats, such as nuts, seeds, fatty fish, and olive oil. ? Low-fat or fat-free dairy.  Check food labels and avoid food and drinks that: ? Are high in calories. ? Have added sugar. ? Are high in sodium. ? Have saturated fats or trans fats.  Limit how much you eat of the following foods: ? Prepackaged meals. ? Fast food. ? Fried foods. ? Processed meat, such as bacon, sausage, and deli meats. ? Fatty cuts of red meat and poultry with skin.  Cook foods in healthier ways, such as by baking, broiling, or grilling.  When grocery shopping, try to shop around the outside of the store. This helps you buy mostly fresh foods and avoid canned and prepackaged foods.  What lifestyle changes can be made?  Exercise at least 30 minutes 5 or more days each week. Exercising includes brisk walking, yard work,  biking, running, swimming, and team sports like basketball and soccer. Ask your health care provider which exercises are safe for you.  Do not use any products that contain nicotine or tobacco, such as cigarettes and e-cigarettes. If you need help quitting, ask your health care provider.  Limit alcohol intake to no more than 1 drink a day for nonpregnant women and 2 drinks a day for men. One drink equals 12 oz of beer, 5 oz of wine, or 1 oz of hard liquor.  Try to get 7-9 hours of sleep each night. What other changes can be made?  Keep a food and activity journal to keep track of: ? What you ate and how many  calories you had. Remember to count sauces, dressings, and side dishes. ? Whether you were active, and what exercises you did. ? Your calorie, weight, and activity goals.  Check your weight regularly. Track any changes. If you notice you have gained weight, make changes to your diet or activity routine.  Avoid taking weight-loss medicines or supplements. Talk to your health care provider before starting any new medicine or supplement.  Talk to your health care provider before trying any new diet or exercise plan. Why are these changes important? Eating healthy, staying active, and having healthy habits not only help prevent obesity, they also:  Help you to manage stress and emotions.  Help you to connect with friends and family.  Improve your self-esteem.  Improve your sleep.  Prevent long-term health problems.  What can happen if changes are not made? Being obese or overweight can cause you to develop joint or bone problems, which can make it hard for you to stay active or do activities you enjoy. Being obese or overweight also puts stress on your heart and lungs and can lead to health problems like diabetes, heart disease, and some cancers. Where to find more information: Talk with your health care provider or a dietitian about healthy eating and healthy lifestyle choices.  You may also find other information through these resources:  U.S. Department of Agriculture MyPlate: https://ball-collins.biz/www.choosemyplate.gov  American Heart Association: www.heart.org  Centers for Disease Control and Prevention: FootballExhibition.com.brwww.cdc.gov  Summary  Staying at a healthy weight is important. It helps prevent certain diseases and health problems, such as heart disease, diabetes, joint problems, sleep disorders, and some cancers.  Being obese or overweight can cause you to develop joint or bone problems, which can make it hard for you to stay active or do activities you enjoy.  You can prevent unhealthy weight gain by eating a healthy diet, exercising regularly, not smoking, limiting alcohol, and getting enough sleep.  Talk with your health care provider or a dietitian for guidance about healthy eating and healthy lifestyle choices. This information is not intended to replace advice given to you by your health care provider. Make sure you discuss any questions you have with your health care provider. Document Released: 08/28/2016 Document Revised: 10/03/2016 Document Reviewed: 10/03/2016 Elsevier Interactive Patient Education  [employer]2018 Elsevier Inc.

## 2018-07-30 NOTE — Assessment & Plan Note (Signed)
Patient has really cut back on pork and beef; wishes to see how he has does with diet and lifestyle changes; he is active with golfing

## 2018-08-15 ENCOUNTER — Other Ambulatory Visit: Payer: Self-pay | Admitting: Family Medicine

## 2018-08-27 ENCOUNTER — Other Ambulatory Visit: Payer: Self-pay | Admitting: Nurse Practitioner

## 2018-09-16 ENCOUNTER — Other Ambulatory Visit: Payer: Managed Care, Other (non HMO)

## 2018-09-21 ENCOUNTER — Other Ambulatory Visit: Payer: Self-pay | Admitting: Family Medicine

## 2018-09-22 NOTE — Telephone Encounter (Signed)
Patient has a urologist I'll respectfully forward this to urologist and ask if they can manage this

## 2018-10-14 ENCOUNTER — Other Ambulatory Visit: Payer: Self-pay

## 2018-10-14 DIAGNOSIS — R972 Elevated prostate specific antigen [PSA]: Secondary | ICD-10-CM

## 2018-10-15 ENCOUNTER — Other Ambulatory Visit: Payer: Self-pay

## 2018-10-15 DIAGNOSIS — R972 Elevated prostate specific antigen [PSA]: Secondary | ICD-10-CM

## 2018-10-16 ENCOUNTER — Telehealth: Payer: Self-pay

## 2018-10-16 LAB — PSA: Prostate Specific Ag, Serum: 5.8 ng/mL — ABNORMAL HIGH (ref 0.0–4.0)

## 2018-10-16 NOTE — Telephone Encounter (Signed)
-----   Message from Vanna Scotland, MD sent at 10/16/2018  9:40 AM EST ----- PSA is actually down slightly to 5.8, great news.  See you in 6 months.  Vanna Scotland, MD

## 2018-10-16 NOTE — Telephone Encounter (Signed)
Called and advised patient of PSA result. 

## 2018-10-24 DIAGNOSIS — G4733 Obstructive sleep apnea (adult) (pediatric): Secondary | ICD-10-CM | POA: Diagnosis not present

## 2018-10-24 DIAGNOSIS — I10 Essential (primary) hypertension: Secondary | ICD-10-CM | POA: Diagnosis not present

## 2018-10-29 ENCOUNTER — Telehealth: Payer: Self-pay

## 2018-10-29 ENCOUNTER — Ambulatory Visit (INDEPENDENT_AMBULATORY_CARE_PROVIDER_SITE_OTHER): Payer: Medicare Other | Admitting: Family Medicine

## 2018-10-29 ENCOUNTER — Encounter: Payer: Self-pay | Admitting: Family Medicine

## 2018-10-29 VITALS — BP 128/74 | HR 99 | Temp 98.0°F | Resp 12 | Ht 72.0 in | Wt 280.8 lb

## 2018-10-29 DIAGNOSIS — Z5181 Encounter for therapeutic drug level monitoring: Secondary | ICD-10-CM

## 2018-10-29 DIAGNOSIS — I10 Essential (primary) hypertension: Secondary | ICD-10-CM | POA: Diagnosis not present

## 2018-10-29 DIAGNOSIS — E782 Mixed hyperlipidemia: Secondary | ICD-10-CM | POA: Diagnosis not present

## 2018-10-29 DIAGNOSIS — R972 Elevated prostate specific antigen [PSA]: Secondary | ICD-10-CM | POA: Diagnosis not present

## 2018-10-29 DIAGNOSIS — E785 Hyperlipidemia, unspecified: Secondary | ICD-10-CM

## 2018-10-29 DIAGNOSIS — N4231 Prostatic intraepithelial neoplasia: Secondary | ICD-10-CM

## 2018-10-29 DIAGNOSIS — R739 Hyperglycemia, unspecified: Secondary | ICD-10-CM

## 2018-10-29 NOTE — Assessment & Plan Note (Signed)
BMI >35 with HTN and high chol; he is working with weight loss; offered nutritionist but he politely declined

## 2018-10-29 NOTE — Assessment & Plan Note (Signed)
Limit cow and pig foods; limit saturated fats; check lipids today, coffee and cereal bar this morning

## 2018-10-29 NOTE — Progress Notes (Signed)
BP 128/74   Pulse 99   Temp 98 F (36.7 C) (Oral)   Resp 12   Ht 6' (1.829 m)   Wt 280 lb 12.8 oz (127.4 kg)   SpO2 96%   BMI 38.08 kg/m    Subjective:    Patient ID: Warren [name], male    DOB: 1951/01/24, 69 y.o.   MRN: [id number]  HPI: Warren [name] is a 68 y.o. male  Chief Complaint  Patient presents with  . Follow-up    HPI Here for f/u High blood pressure; on amlodipine 5 mg and micardis-hctz Checking BP quite a bit he says; one time since last visit when it was 150 over something like 84, but a few minutes later back to normal Checking very often and getting similar numbers to today's readings Not using salt shaker  He retired as did his wife, total lifestyle changes; he gained 20 pounds and is down 10 of those pounds Obesity; started to go out to eat a lot after retiring; they both got back in the gym, he and his wife  High cholesterol; he is trying to watch his breakfast foods; used to eat ham and sausage, now cereal and healthy bars in the morning  Lab Results  Component Value Date   CHOL 191 07/30/2018   HDL 38 (L) 07/30/2018   LDLCALC 128 (H) 07/30/2018   TRIG 132 07/30/2018   CHOLHDL 5.0 (H) 07/30/2018    Erectile dysfunction and elevated PSA, PIN; seeing urologist; he says his numbers came down this time; he is having some issue with his cialis, so he'll work with urology office for that  Depression screen [facility] 2/9 10/29/2018 07/30/2018 07/08/2018 01/27/2018 07/03/2017  Decreased Interest 0 0 0 0 0  Down, Depressed, Hopeless 0 0 0 0 0  PHQ - 2 Score 0 0 0 0 0  Altered sleeping 0 0 - - -  Tired, decreased energy 0 0 - - -  Change in appetite 0 0 - - -  Feeling bad or failure about yourself  0 0 - - -  Trouble concentrating 0 0 - - -  Moving slowly or fidgety/restless 0 0 - - -  Suicidal thoughts 0 0 - - -  PHQ-9 Score 0 0 - - -  Difficult doing work/chores Not difficult at all Not difficult at all - - -   Fall Risk  10/29/2018 07/30/2018  07/08/2018 01/27/2018 09/05/2017  Falls in the past year? 0 0 No No No  Number falls in past yr: 0 - - - -  Injury with Fall? 0 - - - -    Relevant past medical, surgical, family and social history reviewed Past Medical History:  Diagnosis Date  . Elevated PSA 06/07/2017   6.2 -- refer to urologist Sept 2018  . Erectile dysfunction 02/27/2016  . Hypercholesterolemia without hypertriglyceridemia 10/08/2007  . Hypertension   . Malignant hyperthermia 1986   "told I had maybe a mild case of imalignant hyperthermia at [facility]"  . Obesity 02/24/2015  . OSA on CPAP 02/24/2015   CPAP q night   . Osteoarthritis    "feet and knees"  (10/31/2016)  . PIN (prostatic intraepithelial neoplasia) 09/05/2017   Prostate biopsy Nov 2018   Past Surgical History:  Procedure Laterality Date  . HAMMER TOE SURGERY Bilateral 11/2015 - 01/2016   "right-left"  . JOINT REPLACEMENT    . SOFT TISSUE BIOPSY Right ~ 1986   "@ [facility]"  . TOTAL KNEE ARTHROPLASTY  Right 08/22/2016   Procedure: TOTAL KNEE ARTHROPLASTY;  Surgeon: Loreta Ave, MD;  Location: [facility] OR;  Service: Orthopedics;  Laterality: Right;  . TOTAL KNEE ARTHROPLASTY Left 10/31/2016  . TOTAL KNEE ARTHROPLASTY Left 10/31/2016   Procedure: TOTAL KNEE ARTHROPLASTY;  Surgeon: Loreta Ave, MD;  Location: [facility] OR;  Service: Orthopedics;  Laterality: Left;   Family History  Problem Relation Age of Onset  . Heart disease Mother   . Stroke Mother   . Suicidality Father   . Cancer Sister        breast   Social History   Tobacco Use  . Smoking status: Never Smoker  . Smokeless tobacco: Never Used  Substance Use Topics  . Alcohol use: Yes    Alcohol/week: 0.0 standard drinks    Comment: 10/31/2016 "wine a couple times/month"  . Drug use: No     Office Visit from 10/29/2018 in [facility]  AUDIT-C Score  1      Interim medical history since last visit reviewed. Allergies and medications reviewed  Review of Systems  Respiratory:  Negative for wheezing.   Cardiovascular: Negative for chest pain.   Per HPI unless specifically indicated above     Objective:    BP 128/74   Pulse 99   Temp 98 F (36.7 C) (Oral)   Resp 12   Ht 6' (1.829 m)   Wt 280 lb 12.8 oz (127.4 kg)   SpO2 96%   BMI 38.08 kg/m   Wt Readings from Last 3 Encounters:  10/29/18 280 lb 12.8 oz (127.4 kg)  07/30/18 271 lb 11.2 oz (123.2 kg)  07/08/18 269 lb (122 kg)    Physical Exam Constitutional:      General: He is not in acute distress.    Appearance: He is well-developed. He is obese.  HENT:     Head: Normocephalic and atraumatic.  Eyes:     General: No scleral icterus. Neck:     Thyroid: No thyromegaly.  Cardiovascular:     Rate and Rhythm: Normal rate and regular rhythm.  Pulmonary:     Effort: Pulmonary effort is normal.     Breath sounds: Normal breath sounds.  Abdominal:     General: Bowel sounds are normal. There is no distension.     Palpations: Abdomen is soft.  Skin:    General: Skin is warm and dry.     Coloration: Skin is not pale.  Neurological:     Mental Status: He is alert.     Coordination: Coordination normal.  Psychiatric:        Behavior: Behavior normal.        Thought Content: Thought content normal.        Judgment: Judgment normal.     Results for orders placed or performed in visit on 10/15/18  PSA  Result Value Ref Range   Prostate Specific Ag, Serum 5.8 (H) 0.0 - 4.0 ng/mL      Assessment & Plan:   Problem List Items Addressed This Visit      Cardiovascular and Mediastinum   Benign essential HTN (Chronic)    Continue current regimen; dash guidelines        Genitourinary   PIN (prostatic intraepithelial neoplasia)    Managed by urologist        Other   Morbid obesity (HCC)    BMI >35 with HTN and high chol; he is working with weight loss; offered nutritionist but he politely declined  Hyperlipidemia - Primary (Chronic)    Limit cow and pig foods; limit saturated fats;  check lipids today, coffee and cereal bar this morning      Relevant Orders   Lipid panel   Lipid panel   Elevated PSA    Monitored by urologist       Other Visit Diagnoses    Hyperglycemia       Relevant Orders   Hemoglobin A1c   Medication monitoring encounter       Relevant Orders   COMPLETE METABOLIC PANEL WITH GFR       Follow up plan: Return in about 27 weeks (around 05/06/2019) for follow-up visit with Dr. Sherie DonLada (or just after).  An after-visit summary was printed and given to the patient at check-out.  Please see the patient instructions which may contain other information and recommendations beyond what is mentioned above in the assessment and plan.  No orders of the defined types were placed in this encounter.   Orders Placed This Encounter  Procedures  . COMPLETE METABOLIC PANEL WITH GFR  . Hemoglobin A1c  . Lipid panel

## 2018-10-29 NOTE — Assessment & Plan Note (Signed)
Monitored by urologist 

## 2018-10-29 NOTE — Patient Instructions (Addendum)
Try to follow the DASH guidelines (DASH stands for Dietary Approaches to Stop Hypertension). Try to limit the sodium in your diet to no more than 1,500mg  of sodium per day. Certainly try to not exceed 2,000 mg per day at the very most. Do not add salt when cooking or at the table.  Check the sodium amount on labels when shopping, and choose items lower in sodium when given a choice. Avoid or limit foods that already contain a lot of sodium. Eat a diet rich in fruits and vegetables and whole grains, and try to lose weight if overweight or obese  Try to use PLAIN allergy or cold medicine without the decongestant Avoid: phenylephrine, phenylpropanolamine, and pseudoephredine  Try to limit saturated fats in your diet (bologna, hot dogs, barbeque, cheeseburgers, hamburgers, steak, bacon, sausage, cheese, etc.) and get more fresh fruits, vegetables, and whole grains  Return for fasting labs on another day   Preventing Unhealthy Weight Gain, Adult Staying at a healthy weight is important to your overall health. When fat builds up in your body, you may become overweight or obese. Being overweight or obese increases your risk of developing certain health problems, such as heart disease, diabetes, sleeping problems, joint problems, and some types of cancer. Unhealthy weight gain is often the result of making unhealthy food choices or not getting enough exercise. You can make changes to your lifestyle to prevent obesity and stay as healthy as possible. What nutrition changes can be made?   Eat only as much as your body needs. To do this: ? Pay attention to signs that you are hungry or full. Stop eating as soon as you feel full. ? If you feel hungry, try drinking water first before eating. Drink enough water so your urine is clear or pale yellow. ? Eat smaller portions. Pay attention to portion sizes when eating out. ? Look at serving sizes on food labels. Most foods contain more than one serving per  container. ? Eat the recommended number of calories for your gender and activity level. For most active people, a daily total of 2,000 calories is appropriate. If you are trying to lose weight or are not very active, you may need to eat fewer calories. Talk with your health care provider or a diet and nutrition specialist (dietitian) about how many calories you need each day.  Choose healthy foods, such as: ? Fruits and vegetables. At each meal, try to fill at least half of your plate with fruits and vegetables. ? Whole grains, such as whole-wheat bread, brown rice, and quinoa. ? Lean meats, such as chicken or fish. ? Other healthy proteins, such as beans, eggs, or tofu. ? Healthy fats, such as nuts, seeds, fatty fish, and olive oil. ? Low-fat or fat-free dairy products.  Check food labels, and avoid food and drinks that: ? Are high in calories. ? Have added sugar. ? Are high in sodium. ? Have saturated fats or trans fats.  Cook foods in healthier ways, such as by baking, broiling, or grilling.  Make a meal plan for the week, and shop with a grocery list to help you stay on track with your purchases. Try to avoid going to the grocery store when you are hungry.  When grocery shopping, try to shop around the outside of the store first, where the fresh foods are. Doing this helps you to avoid prepackaged foods, which can be high in sugar, salt (sodium), and fat. What lifestyle changes can be made?  Exercise for 30 or more minutes on 5 or more days each week. Exercising may include brisk walking, yard work, biking, running, swimming, and team sports like basketball and soccer. Ask your health care provider which exercises are safe for you.  Do muscle-strengthening activities, such as lifting weights or using resistance bands, on 2 or more days a week.  Do not use any products that contain nicotine or tobacco, such as cigarettes and e-cigarettes. If you need help quitting, ask your health  care provider.  Limit alcohol intake to no more than 1 drink a day for nonpregnant women and 2 drinks a day for men. One drink equals 12 oz of beer, 5 oz of wine, or 1 oz of hard liquor.  Try to get 7-9 hours of sleep each night. What other changes can be made?  Keep a food and activity journal to keep track of: ? What you ate and how many calories you had. Remember to count the calories in sauces, dressings, and side dishes. ? Whether you were active, and what exercises you did. ? Your calorie, weight, and activity goals.  Check your weight regularly. Track any changes. If you notice you have gained weight, make changes to your diet or activity routine.  Avoid taking weight-loss medicines or supplements. Talk to your health care provider before starting any new medicine or supplement.  Talk to your health care provider before trying any new diet or exercise plan. Why are these changes important? Eating healthy, staying active, and having healthy habits can help you to prevent obesity. Those changes also:  Help you manage stress and emotions.  Help you connect with friends and family.  Improve your self-esteem.  Improve your sleep.  Prevent long-term health problems. What can happen if changes are not made? Being obese or overweight can cause you to develop joint or bone problems, which can make it hard for you to stay active or do activities you enjoy. Being obese or overweight also puts stress on your heart and lungs and can lead to health problems like diabetes, heart disease, and some cancers. Where to find more information Talk with your health care provider or a dietitian about healthy eating and healthy lifestyle choices. You may also find information from:  U.[place]. Department of Agriculture, MyPlate: https://ball-collins.biz/  American Heart Association: www.heart.org  Centers for Disease Control and Prevention: FootballExhibition.com.br Summary  Staying at a healthy weight is important  to your overall health. It helps you to prevent certain diseases and health problems, such as heart disease, diabetes, joint problems, sleep disorders, and some types of cancer.  Being obese or overweight can cause you to develop joint or bone problems, which can make it hard for you to stay active or do activities you enjoy.  You can prevent unhealthy weight gain by eating a healthy diet, exercising regularly, not smoking, limiting alcohol, and getting enough sleep.  Talk with your health care provider or a dietitian for guidance about healthy eating and healthy lifestyle choices. This information is not intended to replace advice given to you by your health care provider. Make sure you discuss any questions you have with your health care provider. Document Released: 08/28/2016 Document Revised: 06/07/2017 Document Reviewed: 10/03/2016 Elsevier Interactive Patient Education  2019 Elsevier Inc.  Obesity, Adult Obesity is the condition of having too much total body fat. Being overweight or obese means that your weight is greater than what is considered healthy for your body size. Obesity is determined by a measurement  called BMI. BMI is an estimate of body fat and is calculated from height and weight. For adults, a BMI of 30 or higher is considered obese. Obesity can eventually lead to other health concerns and major illnesses, including:  Stroke.  Coronary artery disease (CAD).  Type 2 diabetes.  Some types of cancer, including cancers of the colon, breast, uterus, and gallbladder.  Osteoarthritis.  High blood pressure (hypertension).  High cholesterol.  Sleep apnea.  Gallbladder stones.  Infertility problems. What are the causes? The main cause of obesity is taking in (consuming) more calories than your body uses for energy. Other factors that contribute to this condition may include:  Being born with genes that make you more likely to become obese.  Having a medical  condition that causes obesity. These conditions include: ? Hypothyroidism. ? Polycystic ovarian syndrome (PCOS). ? Binge-eating disorder. ? Cushing syndrome.  Taking certain medicines, such as steroids, antidepressants, and seizure medicines.  Not being physically active (sedentary lifestyle).  Living where there are limited places to exercise safely or buy healthy foods.  Not getting enough sleep. What increases the risk? The following factors may increase your risk of this condition:  Having a family history of obesity.  Being a woman of African-American descent.  Being a man of Hispanic descent. What are the signs or symptoms? Having excessive body fat is the main symptom of this condition. How is this diagnosed? This condition may be diagnosed based on:  Your symptoms.  Your medical history.  A physical exam. Your health care provider may measure: ? Your BMI. If you are an adult with a BMI between 25 and less than 30, you are considered overweight. If you are an adult with a BMI of 30 or higher, you are considered obese. ? The distances around your hips and your waist (circumferences). These may be compared to each other to help diagnose your condition. ? Your skinfold thickness. Your health care provider may gently pinch a fold of your skin and measure it. How is this treated? Treatment for this condition often includes changing your lifestyle. Treatment may include some or all of the following:  Dietary changes. Work with your health care provider and a dietitian to set a weight-loss goal that is healthy and reasonable for you. Dietary changes may include eating: ? Smaller portions. A portion size is the amount of a particular food that is healthy for you to eat at one time. This varies from person to person. ? Low-calorie or low-fat options. ? More whole grains, fruits, and vegetables.  Regular physical activity. This may include aerobic activity (cardio) and strength  training.  Medicine to help you lose weight. Your health care provider may prescribe medicine if you are unable to lose 1 pound a week after 6 weeks of eating more healthily and doing more physical activity.  Surgery. Surgical options may include gastric banding and gastric bypass. Surgery may be done if: ? Other treatments have not helped to improve your condition. ? You have a BMI of 40 or higher. ? You have life-threatening health problems related to obesity. Follow these instructions at home:  Eating and drinking   Follow recommendations from your health care provider about what you eat and drink. Your health care provider may advise you to: ? Limit fast foods, sweets, and processed snack foods. ? Choose low-fat options, such as low-fat milk instead of whole milk. ? Eat 5 or more servings of fruits or vegetables every day. ? Eat at  home more often. This gives you more control over what you eat. ? Choose healthy foods when you eat out. ? Learn what a healthy portion size is. ? Keep low-fat snacks on hand. ? Avoid sugary drinks, such as soda, fruit juice, iced tea sweetened with sugar, and flavored milk. ? Eat a healthy breakfast.  Drink enough water to keep your urine clear or pale yellow.  Do not go without eating for long periods of time (do not fast) or follow a fad diet. Fasting and fad diets can be unhealthy and even dangerous. Physical Activity  Exercise regularly, as told by your health care provider. Ask your health care provider what types of exercise are safe for you and how often you should exercise.  Warm up and stretch before being active.  Cool down and stretch after being active.  Rest between periods of activity. Lifestyle  Limit the time that you spend in front of your TV, computer, or video game system.  Find ways to reward yourself that do not involve food.  Limit alcohol intake to no more than 1 drink a day for nonpregnant women and 2 drinks a day for  men. One drink equals 12 oz of beer, 5 oz of wine, or 1 oz of hard liquor. General instructions  Keep a weight loss journal to keep track of the food you eat and how much you exercise you get.  Take over-the-counter and prescription medicines only as told by your health care provider.  Take vitamins and supplements only as told by your health care provider.  Consider joining a support group. Your health care provider may be able to recommend a support group.  Keep all follow-up visits as told by your health care provider. This is important. Contact a health care provider if:  You are unable to meet your weight loss goal after 6 weeks of dietary and lifestyle changes. This information is not intended to replace advice given to you by your health care provider. Make sure you discuss any questions you have with your health care provider. Document Released: 10/04/2004 Document Revised: 01/30/2016 Document Reviewed: 06/15/2015 Elsevier Interactive Patient Education  2019 [employer]

## 2018-10-29 NOTE — Assessment & Plan Note (Signed)
Continue current regimen; dash guidelines

## 2018-10-29 NOTE — Assessment & Plan Note (Signed)
Managed by urologist 

## 2018-10-29 NOTE — Telephone Encounter (Signed)
.  mychart

## 2018-11-05 DIAGNOSIS — G4733 Obstructive sleep apnea (adult) (pediatric): Secondary | ICD-10-CM | POA: Diagnosis not present

## 2018-12-24 ENCOUNTER — Other Ambulatory Visit: Payer: Self-pay | Admitting: Family Medicine

## 2018-12-24 NOTE — Telephone Encounter (Signed)
I received a forwarded request to refill from PCP for this patient's Cialis.  Dr. Sherie Don reached out to a several months ago after the patient called her office about the same issue.  We sent him a MyChart message to which he never responded nor did he call our office.   I have never manage this patient's erectile dysfunction or discuss this with him in the past.  I am happy to take this over in the future, however, he will need a visit to discuss this further.  We can discuss this at his visit in June or he can have a virtual visit.  Vanna Scotland, MD

## 2018-12-25 NOTE — Telephone Encounter (Signed)
Left pt mess to call in regards to Cilias script and sent mychart message

## 2018-12-30 ENCOUNTER — Telehealth (INDEPENDENT_AMBULATORY_CARE_PROVIDER_SITE_OTHER): Payer: Medicare Other | Admitting: Urology

## 2018-12-30 ENCOUNTER — Other Ambulatory Visit: Payer: Self-pay

## 2018-12-30 DIAGNOSIS — N5203 Combined arterial insufficiency and corporo-venous occlusive erectile dysfunction: Secondary | ICD-10-CM

## 2018-12-30 DIAGNOSIS — R351 Nocturia: Secondary | ICD-10-CM

## 2018-12-30 MED ORDER — TADALAFIL 5 MG PO TABS: 5.0000 mg | ORAL_TABLET | Freq: Every day | ORAL | 3 refills | Status: DC

## 2018-12-30 MED ORDER — TADALAFIL 20 MG PO TABS: 20.0000 mg | ORAL_TABLET | Freq: Every day | ORAL | 11 refills | Status: DC | PRN

## 2019-01-05 NOTE — Progress Notes (Signed)
Virtual Visit via Video Note  I connected with Warren [name] [place] on 12/30/2018  at  1:00 PM EDT by a video enabled telemedicine application and verified that I am speaking with the correct person using two identifiers.   I discussed the limitations of evaluation and management by telemedicine and the availability of in person appointments. The patient expressed understanding and agreed to proceed.  History of Present Illness: 68 year old male previously followed by me for history of elevated PSA, BPH with nocturia who presents today via virtual visit to discuss erectile dysfunction.  He reports today that he discussed this with his primary care a while back.  He reports that he has been having several years of difficulty maintaining and achieving erections.  He has been using Cialis 5 mg daily in the past which have helped both with his urinary symptoms as well as erectile dysfunction.   His experience no side effects from this medication.  No contraindications for PDE 5 inhibitors.  He does have multiple risk factors for erectile dysfunction including hyper cholesterolemia, hypertension, obesity amongst others.  Terms of history of elevated PSA, he was noted to have 1 elevated PSA to 6.4.  He underwent prostate biopsy 07/2017  without evidence of malignancy.  There was high-grade PIN with adjacent small atypical glands but not diagnostic for prostate cancer.  TRUS volume 21.6 cc.  His most recent PSA was 5.8 on 220.  He is due for repeat evaluation in July 2020.   Observations/Objective: Appears well  Assessment and Plan:  1. Combined arterial insufficiency and corporo-venous occlusive erectile dysfunction We discussed options for management of erectile dysfunction including PDE 5 inhibitors, intracavernosal injections, vacuum erection device and penile prosthesis implant  He is done well with Cialis daily in the past which helps both his urinary symptoms as well as his erectile dysfunction  as previously prescribed by his primary care.  He reports that he has been having trouble getting this covered.  In my experience, we have also had issues getting the main indication covered even when it is for dual purposes including ED and BPH with lots.  We discussed various options including trying to get it approved by insurance, buying out-of-pocket, including the option of breaking a 20 mg tablet and taking over 40..  I will send the prescription to his regular pharmacy, both at the 5 mg dose as well as the 20 mg dose and he will use good Rx to comparison shop for optimal pricing.  2. Nocturia/ BPH with LUTS/ Elevated PSA Ems improved on Cialis 5 mg daily as above Nocturia likely multifactorial, continue to encourage compliance with CPAP Continue to follow PSA as above   Follow Up Instructions: F/u in July as scheduled with PSA prior    I discussed the assessment and treatment plan with the patient. The patient was provided an opportunity to ask questions and all were answered. The patient agreed with the plan and demonstrated an understanding of the instructions.   The patient was advised to call back or seek an in-person evaluation if the symptoms worsen or if the condition fails to improve as anticipated.  I provided 15 minutes of non-face-to-face time during this encounter.   Vanna Scotland, MD

## 2019-01-09 ENCOUNTER — Encounter: Payer: Self-pay | Admitting: Urology

## 2019-01-12 ENCOUNTER — Telehealth: Payer: Self-pay

## 2019-01-12 MED ORDER — TADALAFIL 5 MG PO TABS: 5.0000 mg | ORAL_TABLET | Freq: Every day | ORAL | 3 refills | Status: DC

## 2019-01-12 NOTE — Telephone Encounter (Signed)
Per patient request Cialis script was sent to [facility]

## 2019-01-27 ENCOUNTER — Other Ambulatory Visit: Payer: Self-pay | Admitting: Family Medicine

## 2019-01-27 NOTE — Telephone Encounter (Signed)
Copied from CRM [id number]. Topic: Quick Communication - Rx Refill/Question >> Jan 27, 2019 12:38 PM Mcneil, Jannifer Rodney wrote: Pt stated he is completely out of the medication. Medication: telmisartan-hydrochlorothiazide (MICARDIS HCT) 80-25 MG tablet  Has the patient contacted their pharmacy? yes   Preferred Pharmacy (with phone number or street name): [id number] [place], [place] - [address] AT [facility] [address] [phone] (Phone)  [phone] (Fax)  Agent: Please be advised that RX refills may take up to 3 business days. We ask that you follow-up with your pharmacy.

## 2019-02-01 ENCOUNTER — Other Ambulatory Visit: Payer: Self-pay | Admitting: Family Medicine

## 2019-03-12 ENCOUNTER — Other Ambulatory Visit: Payer: Self-pay | Admitting: Family Medicine

## 2019-03-12 DIAGNOSIS — R972 Elevated prostate specific antigen [PSA]: Secondary | ICD-10-CM

## 2019-03-16 ENCOUNTER — Other Ambulatory Visit: Payer: Self-pay

## 2019-03-16 ENCOUNTER — Other Ambulatory Visit: Payer: Medicare Other

## 2019-03-16 DIAGNOSIS — R972 Elevated prostate specific antigen [PSA]: Secondary | ICD-10-CM

## 2019-03-17 LAB — PSA: Prostate Specific Ag, Serum: 6.6 ng/mL — ABNORMAL HIGH (ref 0.0–4.0)

## 2019-03-18 ENCOUNTER — Encounter: Payer: Self-pay | Admitting: Urology

## 2019-03-18 ENCOUNTER — Other Ambulatory Visit: Payer: Self-pay

## 2019-03-18 ENCOUNTER — Ambulatory Visit: Payer: Medicare Other | Admitting: Urology

## 2019-03-18 VITALS — BP 129/68 | HR 67 | Ht 72.0 in | Wt 287.0 lb

## 2019-03-18 DIAGNOSIS — R351 Nocturia: Secondary | ICD-10-CM

## 2019-03-18 DIAGNOSIS — R972 Elevated prostate specific antigen [PSA]: Secondary | ICD-10-CM | POA: Diagnosis not present

## 2019-03-18 DIAGNOSIS — N5203 Combined arterial insufficiency and corporo-venous occlusive erectile dysfunction: Secondary | ICD-10-CM | POA: Diagnosis not present

## 2019-03-18 NOTE — Progress Notes (Signed)
03/18/2019 10:10 AM   Warren [name] [place] Nov 06, 1950 [id number]  Referring provider: Kerman PasseyLada, Melinda P, MD [address] [place],  [place] [id number]  Chief Complaint  Patient presents with  . Elevated PSA    follow up    HPI: 68 year old male who returns today for routine follow-up for multiple GU issues.  Elevated PSA: Personal history of elevated PSA in the 5-6 range.  Status post prostate biopsy 07/2017 without evidence of malignancy.  There was high-grade PIN and a small adjacent focus of atypical glands.  Truss volume 21.6 cc.  In the interim, his PSA is remained relatively stable.  Most recent PSA this week 6.6 which is up from February but essentially unchanged and stable.  He denies any urinary issues.  No weight loss or bone pain.  Erectile dysfunction: Currently taking Cialis 5 mg daily.  Doing well with this.  Affordable using good Rx app.  No side effects.  Also reports this helped with his nocturia.   PMH: Past Medical History:  Diagnosis Date  . Elevated PSA 06/07/2017   6.2 -- refer to urologist Sept 2018  . Erectile dysfunction 02/27/2016  . Hypercholesterolemia without hypertriglyceridemia 10/08/2007  . Hypertension   . Malignant hyperthermia 1986   "told I had maybe a mild case [facility] at [facility]"  . Obesity 02/24/2015  . OSA on CPAP 02/24/2015   CPAP q night   . Osteoarthritis    "feet and knees"  (10/31/2016)  . PIN (prostatic intraepithelial neoplasia) 09/05/2017   Prostate biopsy Nov 2018    Surgical History: Past Surgical History:  Procedure Laterality Date  . HAMMER TOE SURGERY Bilateral 11/2015 - 01/2016   "right-left"  . JOINT REPLACEMENT    . SOFT TISSUE BIOPSY Right ~ 1986   "@ [facility]"  . TOTAL KNEE ARTHROPLASTY Right 08/22/2016   Procedure: TOTAL KNEE ARTHROPLASTY;  Surgeon: Loreta Aveaniel F Murphy, MD;  [facility];  Service: Orthopedics;  Laterality: Right;  . TOTAL KNEE ARTHROPLASTY Left 10/31/2016  . TOTAL KNEE  ARTHROPLASTY Left 10/31/2016   Procedure: TOTAL KNEE ARTHROPLASTY;  Surgeon: Loreta Aveaniel F Murphy, MD;  [facility];  Service: Orthopedics;  Laterality: Left;    Home Medications:  Allergies as of 03/18/2019   No Known Allergies     Medication List       Accurate as of March 18, 2019 10:10 AM. If you have any questions, ask your nurse or doctor.        amLODipine 5 MG tablet Commonly known as: NORVASC TAKE 1 AND 1/2 TABLETS BY  MOUTH DAILY   aspirin EC 81 MG tablet Take 1 tablet (81 mg total) by mouth daily.   Fish Oil 1200 MG Caps Take 1,200 mg by mouth.   tadalafil 20 MG tablet Commonly known as: CIALIS Take 1 tablet (20 mg total) by mouth daily as needed for erectile dysfunction. Break tablet in 4 and take 1/4 daily   tadalafil 5 MG tablet Commonly known as: Cialis Take 1 tablet (5 mg total) by mouth daily.   telmisartan-hydrochlorothiazide 80-25 MG tablet Commonly known as: MICARDIS HCT TAKE 1 TABLET BY MOUTH DAILY FOR BLOOD PRESSURE       Allergies: No Known Allergies  Family History: Family History  Problem Relation Age of Onset  . Heart disease Mother   . Stroke Mother   . Suicidality Father   . Cancer Sister        breast    Social History:  reports  that he has never smoked. He has never used smokeless tobacco. He reports current alcohol use. He reports that he does not use drugs.  ROS: UROLOGY Frequent Urination?: Yes Hard to postpone urination?: No Burning/pain with urination?: No Get up at night to urinate?: No Leakage of urine?: No Urine stream starts and stops?: No Trouble starting stream?: No Do you have to strain to urinate?: No Blood in urine?: No Urinary tract infection?: No Sexually transmitted disease?: No Injury to kidneys or bladder?: No Painful intercourse?: No Weak stream?: No Erection problems?: Yes Penile pain?: No  Gastrointestinal Nausea?: No Vomiting?: No Indigestion/heartburn?: No Diarrhea?: No Constipation?: No   Constitutional Fever: No Night sweats?: No Weight loss?: No Fatigue?: No  Skin Skin rash/lesions?: No Itching?: No  Eyes Blurred vision?: No Double vision?: No  Ears/Nose/Throat Sore throat?: No Sinus problems?: No  Hematologic/Lymphatic Swollen glands?: No Easy bruising?: No  Cardiovascular Leg swelling?: No Chest pain?: No  Respiratory Cough?: No Shortness of breath?: No  Endocrine Excessive thirst?: No  Musculoskeletal Back pain?: No Joint pain?: No  Neurological Headaches?: No Dizziness?: No  Psychologic Depression?: No Anxiety?: No  Physical Exam: BP 129/68   Pulse 67   Ht 6' (1.829 m)   Wt 287 lb (130.2 kg)   BMI 38.92 kg/m   Constitutional:  Alert and oriented, No acute distress. HEENT: [place] AT, moist mucus membranes.  Trachea midline, no masses. Cardiovascular: No clubbing, cyanosis, or edema. Respiratory: Normal respiratory effort, no increased work of breathing. GI: Abdomen is soft, nontender, nondistended, no abdominal masses Rectal: Normal sphincter tone.  Exam limited by habitus, apex and mid prostate unremarkable, no nodules.  Unable to palpate base. Skin: No rashes, bruises or suspicious lesions. Neurologic: Grossly intact, no focal deficits, moving all 4 extremities. Psychiatric: Normal mood and affect.  Laboratory Data: Lab Results  Component Value Date   WBC 9.8 11/01/2016   HGB 13.1 11/01/2016   HCT 39.4 11/01/2016   MCV 89.1 11/01/2016   PLT 235 11/01/2016    Lab Results  Component Value Date   CREATININE 1.06 07/08/2018     Lab Results  Component Value Date   HGBA1C 5.4 01/28/2018   Component     Latest Ref Rng & Units 06/06/2017 07/09/2017 01/28/2018 03/12/2018  Prostate Specific Ag, Serum     0.0 - 4.0 ng/mL 6.2 (H) 6.4 (H) 6.2 (H) 6.7 (H)   Component     Latest Ref Rng & Units 10/15/2018 03/16/2019  Prostate Specific Ag, Serum     0.0 - 4.0 ng/mL 5.8 (H) 6.6 (H)    Assessment & Plan:    1. Elevated PSA  Personal history of stably elevated PSA, this appears to be his baseline Previous biopsy 2018 with atypical glands but not diagnostic of prostate cancer We discussed today options including repeat biopsy, prostate MRI versus continued surveillance He would like to continue surveillance at this time, I recommended a PSA every 6 months and rectal exam every year  2. Combined arterial insufficiency and corporo-venous occlusive erectile dysfunction Continue Cialis 5 mg daily  3. Nocturia Improved   Return in about 6 months (around 09/18/2019) for PSA only, MD visit in 12 month with PSA/ DRE.  Hollice Espy, MD  [facility] [address], [place], [place] [address] [phone]

## 2019-05-07 ENCOUNTER — Ambulatory Visit: Payer: Medicare Other | Admitting: Nurse Practitioner

## 2019-05-08 ENCOUNTER — Ambulatory Visit: Payer: Medicare Other | Admitting: Nurse Practitioner

## 2019-05-11 ENCOUNTER — Ambulatory Visit (INDEPENDENT_AMBULATORY_CARE_PROVIDER_SITE_OTHER): Payer: Medicare Other | Admitting: Nurse Practitioner

## 2019-05-11 ENCOUNTER — Other Ambulatory Visit: Payer: Self-pay

## 2019-05-11 ENCOUNTER — Encounter: Payer: Self-pay | Admitting: Nurse Practitioner

## 2019-05-11 VITALS — BP 125/79 | HR 65 | Resp 16 | Wt 275.0 lb

## 2019-05-11 DIAGNOSIS — G4733 Obstructive sleep apnea (adult) (pediatric): Secondary | ICD-10-CM | POA: Diagnosis not present

## 2019-05-11 DIAGNOSIS — E782 Mixed hyperlipidemia: Secondary | ICD-10-CM | POA: Diagnosis not present

## 2019-05-11 DIAGNOSIS — I10 Essential (primary) hypertension: Secondary | ICD-10-CM | POA: Diagnosis not present

## 2019-05-11 DIAGNOSIS — N5203 Combined arterial insufficiency and corporo-venous occlusive erectile dysfunction: Secondary | ICD-10-CM

## 2019-05-11 MED ORDER — TELMISARTAN-HCTZ 80-25 MG PO TABS: 1.0000 | ORAL_TABLET | Freq: Every day | ORAL | 1 refills | Status: DC

## 2019-05-11 NOTE — Progress Notes (Signed)
Virtual Visit via Video Note  I connected with Warren [name] on 05/11/19 at  3:40 PM EDT by a video enabled telemedicine application and verified that I am speaking with the correct person using two identifiers.   Staff discussed the limitations of evaluation and management by telemedicine and the availability of in person appointments. The patient expressed understanding and agreed to proceed.  Patient location: home  My location: work office Other people present: none HPI   Hypertension Patient is on amlodipine 7.5mg  daily, telmisartan 80mg  daily and HCTZ 25mg  daily.  Takes medications as prescribed with no missed doses a month.  He is compliant with low-salt diet.  He does check his blood pressure at home reports they are within normal range Denies chest pain, headaches, blurry vision.  Obstructive sleep apnea  Self report Cpap compliance: 100%; feels more rested no fatigue on this.   Hyperlipidemia Patient it taking fish oils two a day and has increased exercise more and aimed to improve his diet since the last year- states had cut down on processed foods such as hot dogs and pork. Eats more chicken and fish instead and added salmon to his diet. He is eating vegetables daily and has increased intake of fruits and vegetable  Lab Results  Component Value Date   CHOL 191 07/30/2018   HDL 38 (L) 07/30/2018   LDLCALC 128 (H) 07/30/2018   TRIG 132 07/30/2018   CHOLHDL 5.0 (H) 07/30/2018   Wt Readings from Last 3 Encounters:  05/11/19 275 lb (124.7 kg)  03/18/19 287 lb (130.2 kg)  10/29/18 280 lb 12.8 oz (127.4 kg)  states he got 260 at one time.   Erectile dysfunction Patient is taking cialis 5mg  daily. Prevents nocturua.  Follows up with urology- Dr. Erlene Quan for this and elevated PSA had negative for malignancy in biopsy in 2018.    PHQ2/9: Depression screen [facility] 2/9 05/11/2019 10/29/2018 07/30/2018 07/08/2018 01/27/2018  Decreased Interest 0 0 0 0 0  Down, Depressed, Hopeless  0 0 0 0 0  PHQ - 2 Score 0 0 0 0 0  Altered sleeping 0 0 0 - -  Tired, decreased energy 0 0 0 - -  Change in appetite 0 0 0 - -  Feeling bad or failure about yourself  0 0 0 - -  Trouble concentrating 0 0 0 - -  Moving slowly or fidgety/restless 0 0 0 - -  Suicidal thoughts 0 0 0 - -  PHQ-9 Score 0 0 0 - -  Difficult doing work/chores Not difficult at all Not difficult at all Not difficult at all - -    PHQ reviewed. Negative  Patient Active Problem List   Diagnosis Date Noted  . Morbid obesity ([place]) 07/30/2018  . PIN (prostatic intraepithelial neoplasia) 09/05/2017  . Elevated PSA 06/07/2017  . Abnormal EKG 08/06/2016  . Erectile dysfunction 02/27/2016  . Obstructive sleep apnea of adult 02/24/2015  . Flat foot 07/05/2008  . Allergic rhinitis 01/12/2008  . Hyperlipidemia 10/08/2007  . Benign essential HTN 08/13/2007    Past Medical History:  Diagnosis Date  . Elevated PSA 06/07/2017   6.2 -- refer to urologist Sept 2018  . Erectile dysfunction 02/27/2016  . Hypercholesterolemia without hypertriglyceridemia 10/08/2007  . Hypertension   . Malignant hyperthermia 1986   "told I had maybe a mild case of imalignant hyperthermia at [facility]"  . Obesity 02/24/2015  . OSA on CPAP 02/24/2015   CPAP q night   . Osteoarthritis    "  feet and knees"  (10/31/2016)  . PIN (prostatic intraepithelial neoplasia) 09/05/2017   Prostate biopsy Nov 2018    Past Surgical History:  Procedure Laterality Date  . HAMMER TOE SURGERY Bilateral 11/2015 - 01/2016   "right-left"  . JOINT REPLACEMENT    . SOFT TISSUE BIOPSY Right ~ 1986   "@ [facility]"  . TOTAL KNEE ARTHROPLASTY Right 08/22/2016   Procedure: TOTAL KNEE ARTHROPLASTY;  Surgeon: Loreta Aveaniel F Murphy, MD;  [facility];  Service: Orthopedics;  Laterality: Right;  . TOTAL KNEE ARTHROPLASTY Left 10/31/2016  . TOTAL KNEE ARTHROPLASTY Left 10/31/2016   Procedure: TOTAL KNEE ARTHROPLASTY;  Surgeon: Loreta Aveaniel F Murphy, MD;  [facility];  Service:  Orthopedics;  Laterality: Left;    Social History   Tobacco Use  . Smoking status: Never Smoker  . Smokeless tobacco: Never Used  Substance Use Topics  . Alcohol use: Yes    Alcohol/week: 0.0 standard drinks    Comment: 10/31/2016 "wine a couple times/month"     Current Outpatient Medications:  .  amLODipine (NORVASC) 5 MG tablet, TAKE 1 AND 1/2 TABLETS BY  MOUTH DAILY, Disp: 135 tablet, Rfl: 1 .  aspirin EC 81 MG tablet, Take 1 tablet (81 mg total) by mouth daily., Disp: , Rfl:  .  Omega-3 Fatty Acids (FISH OIL) 1200 MG CAPS, Take 1,200 mg by mouth., Disp: , Rfl:  .  tadalafil (CIALIS) 5 MG tablet, Take 1 tablet (5 mg total) by mouth daily., Disp: 90 tablet, Rfl: 3 .  telmisartan-hydrochlorothiazide (MICARDIS HCT) 80-25 MG tablet, TAKE 1 TABLET BY MOUTH DAILY FOR BLOOD PRESSURE, Disp: 90 tablet, Rfl: 1  No Known Allergies  ROS   No other specific complaints in a complete review of systems (except as listed in HPI above).  Objective  Vitals:   05/11/19 1553  BP: 125/79  Pulse: 65  Resp: 16  Weight: 275 lb (124.7 kg)     Body mass index is 37.3 kg/m.  Nursing Note and Vital Signs reviewed.  Physical Exam   Constitutional: Patient appears well-developed and well-nourished. No distress.  HENT: Head: Normocephalic and atraumatic. Cardiovascular: Normal rate Pulmonary/Chest: Effort normal  Musculoskeletal: Normal range of motion,  Neurological: alert and oriented, speech normal.   Psychiatric: Patient has a normal mood and affect. behavior is normal. Judgment and thought content normal.    Assessment & Plan  1. Benign essential HTN Stable continue meds  - telmisartan-hydrochlorothiazide (MICARDIS HCT) 80-25 MG tablet; Take 1 tablet by mouth daily. for blood pressure  Dispense: 90 tablet; Refill: 1  2. Obstructive sleep apnea of adult Continue CPAP  3. Mixed hyperlipidemia Patient would like to hold off on labs today and come in for 3 month routine  follow-up with labs so he can continue to work on weight loss and healthy eating   4. Morbid obesity (HCC) Has been losing weight, continue to work on portion control, healthy eating and exercise  5. 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 The patient was provided an opportunity to ask questions and all were answered. The patient agreed with the plan and demonstrated an understanding of the instructions.   The patient was advised to call back or seek an in-person evaluation if the symptoms worsen or if the condition fails to improve as anticipated.  I provided 13 minutes of non-face-to-face time during  this encounter.   Fredderick Severance, NP

## 2019-05-21 DIAGNOSIS — G4733 Obstructive sleep apnea (adult) (pediatric): Secondary | ICD-10-CM | POA: Diagnosis not present

## 2019-07-02 ENCOUNTER — Other Ambulatory Visit: Payer: Self-pay

## 2019-07-02 DIAGNOSIS — Z20822 Contact with and (suspected) exposure to covid-19: Secondary | ICD-10-CM

## 2019-07-04 LAB — NOVEL CORONAVIRUS, NAA: SARS-CoV-2, NAA: NOT DETECTED

## 2019-07-09 ENCOUNTER — Ambulatory Visit (INDEPENDENT_AMBULATORY_CARE_PROVIDER_SITE_OTHER): Payer: Medicare Other

## 2019-07-09 ENCOUNTER — Ambulatory Visit: Payer: Managed Care, Other (non HMO)

## 2019-07-09 VITALS — BP 140/73 | HR 68 | Temp 97.8°F | Ht 72.0 in | Wt 281.0 lb

## 2019-07-09 DIAGNOSIS — Z0000 Encounter for general adult medical examination without abnormal findings: Secondary | ICD-10-CM

## 2019-07-09 NOTE — Patient Instructions (Signed)
Warren [name] , Thank you for taking time to come for your Medicare Wellness Visit. I appreciate your ongoing commitment to your health goals. Please review the following plan we discussed and let me know if I can assist you in the future.   Screening recommendations/referrals: Colonoscopy: done 04/23/13. Repeat in 2024. Recommended yearly ophthalmology/optometry visit for glaucoma screening and checkup Recommended yearly dental visit for hygiene and checkup  Vaccinations: Influenza vaccine: due Pneumococcal vaccine: due Tdap vaccine: done 02/08/11 Shingles vaccine: Shingrix discussed. Please contact your pharmacy for coverage information.     Advanced directives: Advance directive discussed with you today. Even though you declined this today please call our office should you change your mind and we can give you the proper paperwork for you to fill out.  Conditions/risks identified: Recommend healthy eating and physical activity for desired weight loss.   Next appointment: Please follow up in one year for your Medicare Annual Wellness visit.    Preventive Care 32 Years and Older, Male Preventive care refers to lifestyle choices and visits with your health care provider that can promote health and wellness. What does preventive care include?  A yearly physical exam. This is also called an annual well check.  Dental exams once or twice a year.  Routine eye exams. Ask your health care provider how often you should have your eyes checked.  Personal lifestyle choices, including:  Daily care of your teeth and gums.  Regular physical activity.  Eating a healthy diet.  Avoiding tobacco and drug use.  Limiting alcohol use.  Practicing safe sex.  Taking low doses of aspirin every day.  Taking vitamin and mineral supplements as recommended by your health care provider. What happens during an annual well check? The services and screenings done by your health care provider during your  annual well check will depend on your age, overall health, lifestyle risk factors, and family history of disease. Counseling  Your health care provider may ask you questions about your:  Alcohol use.  Tobacco use.  Drug use.  Emotional well-being.  Home and relationship well-being.  Sexual activity.  Eating habits.  History of falls.  Memory and ability to understand (cognition).  Work and work Statistician. Screening  You may have the following tests or measurements:  Height, weight, and BMI.  Blood pressure.  Lipid and cholesterol levels. These may be checked every 5 years, or more frequently if you are over 44 years old.  Skin check.  Lung cancer screening. You may have this screening every year starting at age 51 if you have a 30-pack-year history of smoking and currently smoke or have quit within the past 15 years.  Fecal occult blood test (FOBT) of the stool. You may have this test every year starting at age 20.  Flexible sigmoidoscopy or colonoscopy. You may have a sigmoidoscopy every 5 years or a colonoscopy every 10 years starting at age 51.  Prostate cancer screening. Recommendations will vary depending on your family history and other risks.  Hepatitis C blood test.  Hepatitis B blood test.  Sexually transmitted disease (STD) testing.  Diabetes screening. This is done by checking your blood sugar (glucose) after you have not eaten for a while (fasting). You may have this done every 1-3 years.  Abdominal aortic aneurysm (AAA) screening. You may need this if you are a current or former smoker.  Osteoporosis. You may be screened starting at age 7 if you are at high risk. Talk with your health care provider  about your test results, treatment options, and if necessary, the need for more tests. Vaccines  Your health care provider may recommend certain vaccines, such as:  Influenza vaccine. This is recommended every year.  Tetanus, diphtheria, and  acellular pertussis (Tdap, Td) vaccine. You may need a Td booster every 10 years.  Zoster vaccine. You may need this after age 33.  Pneumococcal 13-valent conjugate (PCV13) vaccine. One dose is recommended after age 45.  Pneumococcal polysaccharide (PPSV23) vaccine. One dose is recommended after age 27. Talk to your health care provider about which screenings and vaccines you need and how often you need them. This information is not intended to replace advice given to you by your health care provider. Make sure you discuss any questions you have with your health care provider. Document Released: 09/23/2015 Document Revised: 05/16/2016 Document Reviewed: 06/28/2015 Elsevier Interactive Patient Education  2017 [place] Prevention in the Home Falls can cause injuries. They can happen to people of all ages. There are many things you can do to make your home safe and to help prevent falls. What can I do on the outside of my home?  Regularly fix the edges of walkways and driveways and fix any cracks.  Remove anything that might make you trip as you walk through a door, such as a raised step or threshold.  Trim any bushes or trees on the path to your home.  Use bright outdoor lighting.  Clear any walking paths of anything that might make someone trip, such as rocks or tools.  Regularly check to see if handrails are loose or broken. Make sure that both sides of any steps have handrails.  Any raised decks and porches should have guardrails on the edges.  Have any leaves, snow, or ice cleared regularly.  Use sand or salt on walking paths during winter.  Clean up any spills in your garage right away. This includes oil or grease spills. What can I do in the bathroom?  Use night lights.  Install grab bars by the toilet and in the tub and shower. Do not use towel bars as grab bars.  Use non-skid mats or decals in the tub or shower.  If you need to sit down in the shower, use  a plastic, non-slip stool.  Keep the floor dry. Clean up any water that spills on the floor as soon as it happens.  Remove soap buildup in the tub or shower regularly.  Attach bath mats securely with double-sided non-slip rug tape.  Do not have throw rugs and other things on the floor that can make you trip. What can I do in the bedroom?  Use night lights.  Make sure that you have a light by your bed that is easy to reach.  Do not use any sheets or blankets that are too big for your bed. They should not hang down onto the floor.  Have a firm chair that has side arms. You can use this for support while you get dressed.  Do not have throw rugs and other things on the floor that can make you trip. What can I do in the kitchen?  Clean up any spills right away.  Avoid walking on wet floors.  Keep items that you use a lot in easy-to-reach places.  If you need to reach something above you, use a strong step stool that has a grab bar.  Keep electrical cords out of the way.  Do not use floor polish or  wax that makes floors slippery. If you must use wax, use non-skid floor wax.  Do not have throw rugs and other things on the floor that can make you trip. What can I do with my stairs?  Do not leave any items on the stairs.  Make sure that there are handrails on both sides of the stairs and use them. Fix handrails that are broken or loose. Make sure that handrails are as long as the stairways.  Check any carpeting to make sure that it is firmly attached to the stairs. Fix any carpet that is loose or worn.  Avoid having throw rugs at the top or bottom of the stairs. If you do have throw rugs, attach them to the floor with carpet tape.  Make sure that you have a light switch at the top of the stairs and the bottom of the stairs. If you do not have them, ask someone to add them for you. What else can I do to help prevent falls?  Wear shoes that:  Do not have high heels.  Have  rubber bottoms.  Are comfortable and fit you well.  Are closed at the toe. Do not wear sandals.  If you use a stepladder:  Make sure that it is fully opened. Do not climb a closed stepladder.  Make sure that both sides of the stepladder are locked into place.  Ask someone to hold it for you, if possible.  Clearly mark and make sure that you can see:  Any grab bars or handrails.  First and last steps.  Where the edge of each step is.  Use tools that help you move around (mobility aids) if they are needed. These include:  Canes.  Walkers.  Scooters.  Crutches.  Turn on the lights when you go into a dark area. Replace any light bulbs as soon as they burn out.  Set up your furniture so you have a clear path. Avoid moving your furniture around.  If any of your floors are uneven, fix them.  If there are any pets around you, be aware of where they are.  Review your medicines with your doctor. Some medicines can make you feel dizzy. This can increase your chance of falling. Ask your doctor what other things that you can do to help prevent falls. This information is not intended to replace advice given to you by your health care provider. Make sure you discuss any questions you have with your health care provider. Document Released: 06/23/2009 Document Revised: 02/02/2016 Document Reviewed: 10/01/2014 Elsevier Interactive Patient Education  2017 [employer].

## 2019-07-09 NOTE — Progress Notes (Addendum)
Subjective:   Warren [name] is a 68 y.o. male who presents for an Initial Medicare Annual Wellness Visit.  Virtual Visit via Telephone Note  I connected with Warren [name] on 07/09/19 at  9:20 AM EDT by telephone and verified that I am speaking with the correct person using two identifiers.  Medicare Annual Wellness visit completed telephonically due to Covid-19 pandemic.   Location: Patient: home Provider: office   I discussed the limitations, risks, security and privacy concerns of performing an evaluation and management service by telephone and the availability of in person appointments. The patient expressed understanding and agreed to proceed.  Some vital signs may be absent or patient reported.   Reather Littler, LPN    Review of Systems   Cardiac Risk Factors include: advanced age (>27men, >62 women);male gender;hypertension;dyslipidemia;obesity (BMI >30kg/m2)    Objective:    Today's Vitals   07/09/19 0921  BP: 140/73  Pulse: 68  Temp: 97.8 F (36.6 C)  Weight: 281 lb (127.5 kg)  Height: 6' (1.829 m)   Body mass index is 38.11 kg/m.  Advanced Directives 07/09/2019 07/03/2017 06/06/2017 10/31/2016 10/30/2016 10/17/2016 08/23/2016  Does Patient Have a Medical Advance Directive? No No No No No No No  Type of Advance Directive - - - - - - -  Would patient like information on creating a medical advance directive? No - Patient declined - - No - Patient declined - No - Patient declined No - Patient declined    Current Medications (verified) Outpatient Encounter Medications as of 07/09/2019  Medication Sig  . amLODipine (NORVASC) 5 MG tablet TAKE 1 AND 1/2 TABLETS BY  MOUTH DAILY  . aspirin EC 81 MG tablet Take 1 tablet (81 mg total) by mouth daily.  . Omega-3 Fatty Acids (FISH OIL) 1200 MG CAPS Take 1,200 mg by mouth.  . tadalafil (CIALIS) 5 MG tablet Take 1 tablet (5 mg total) by mouth daily.  Marland Kitchen telmisartan-hydrochlorothiazide (MICARDIS HCT) 80-25 MG tablet Take 1  tablet by mouth daily. for blood pressure   No facility-administered encounter medications on file as of 07/09/2019.     Allergies (verified) Patient has no known allergies.   History: Past Medical History:  Diagnosis Date  . Elevated PSA 06/07/2017   6.2 -- refer to urologist Sept 2018  . Erectile dysfunction 02/27/2016  . Hypercholesterolemia without hypertriglyceridemia 10/08/2007  . Hypertension   . Malignant hyperthermia 1986   "told I had maybe a mild case of imalignant hyperthermia at [facility]"  . Obesity 02/24/2015  . OSA on CPAP 02/24/2015   CPAP q night   . Osteoarthritis    "feet and knees"  (10/31/2016)  . PIN (prostatic intraepithelial neoplasia) 09/05/2017   Prostate biopsy Nov 2018   Past Surgical History:  Procedure Laterality Date  . HAMMER TOE SURGERY Bilateral 11/2015 - 01/2016   "right-left"  . JOINT REPLACEMENT    . SOFT TISSUE BIOPSY Right ~ 1986   "@ [facility]"  . TOTAL KNEE ARTHROPLASTY Right 08/22/2016   Procedure: TOTAL KNEE ARTHROPLASTY;  Surgeon: Loreta Ave, MD;  Location: [facility] OR;  Service: Orthopedics;  Laterality: Right;  . TOTAL KNEE ARTHROPLASTY Left 10/31/2016  . TOTAL KNEE ARTHROPLASTY Left 10/31/2016   Procedure: TOTAL KNEE ARTHROPLASTY;  Surgeon: Loreta Ave, MD;  Location: [facility] OR;  Service: Orthopedics;  Laterality: Left;   Family History  Problem Relation Age of Onset  . Heart disease Mother   . Stroke Mother   . Suicidality Father   .  Cancer Sister        breast   Social History   Socioeconomic History  . Marital status: Married    Spouse name: Not on file  . Number of children: 2  . Years of education: Not on file  . Highest education level: Not on file  Occupational History  . Not on file  Social Needs  . Financial resource strain: Not hard at all  . Food insecurity    Worry: Never true    Inability: Never true  . Transportation needs    Medical: No    Non-medical: No  Tobacco Use  . Smoking status: Never Smoker  .  Smokeless tobacco: Never Used  Substance and Sexual Activity  . Alcohol use: Yes    Alcohol/week: 0.0 standard drinks    Comment: 10/31/2016 "wine a couple times/month"  . Drug use: No  . Sexual activity: Yes    Partners: Female  Lifestyle  . Physical activity    Days per week: 3 days    Minutes per session: 50 min  . Stress: Not at all  Relationships  . Social connections    Talks on phone: More than three times a week    Gets together: More than three times a week    Attends religious service: More than 4 times per year    Active member of club or organization: Yes    Attends meetings of clubs or organizations: More than 4 times per year    Relationship status: Married  Other Topics Concern  . Not on file  Social History Narrative  . Not on file   Tobacco Counseling Counseling given: Not Answered   Clinical Intake:  Pre-visit preparation completed: Yes  Pain : No/denies pain     BMI - recorded: 38.11 Nutritional Status: BMI > 30  Obese Nutritional Risks: None Diabetes: No  How often do you need to have someone help you when you read instructions, pamphlets, or other written materials from your doctor or pharmacy?: 1 - Never  Interpreter Needed?: No  Information entered by :: Reather Littler LPN  Activities of Daily Living In your present state of health, do you have any difficulty performing the following activities: 07/09/2019 05/11/2019  Hearing? N N  Comment declines hearing aids -  Vision? N N  Difficulty concentrating or making decisions? N N  Walking or climbing stairs? N N  Dressing or bathing? N N  Doing errands, shopping? N N  Preparing Food and eating ? N -  Using the Toilet? N -  In the past six months, have you accidently leaked urine? N -  Do you have problems with loss of bowel control? N -  Managing your Medications? N -  Managing your Finances? N -  Housekeeping or managing your Housekeeping? N -  Some recent data might be hidden      Immunizations and Health Maintenance Immunization History  Administered Date(s) Administered  . Influenza, High Dose Seasonal PF 07/08/2018  . Influenza, Seasonal, Injecte, Preservative Fre 04/10/2012, 06/24/2013  . Influenza,inj,Quad PF,6+ Mos 08/06/2016, 06/06/2017  . Tdap 02/08/2011  . Zoster 03/19/2013   Health Maintenance Due  Topic Date Due  . PNA vac Low Risk Adult (1 of 2 - PCV13) 12/21/2017  . INFLUENZA VACCINE  04/11/2019    Patient Care Team: Kerman Passey, MD as PCP - General (Family Medicine) Vanna Scotland, MD as Consulting Physician (Urology)  Indicate any recent Medical Services you may have received from other than  [facility] providers in the past year (date may be approximate).    Assessment:   This is a routine wellness examination for Warren [name].  Hearing/Vision screen  Hearing Screening   125Hz  250Hz  500Hz  1000Hz  2000Hz  3000Hz  4000Hz  6000Hz  8000Hz   Right ear:           Left ear:           Comments: Pt denies hearing difficulty  Vision Screening Comments: Patient past due for eye exam. Not established with a provider at this time.   Dietary issues and exercise activities discussed: Current Exercise Habits: Home exercise routine, Type of exercise: walking;Other - see comments(golf), Time (Minutes): 50, Frequency (Times/Week): 3, Weekly Exercise (Minutes/Week): 150, Intensity: Moderate, Exercise limited by: None identified  Goals    . Weight (lb) < 270 lb (122.5 kg)     Pt would like to lose weight and eat healthier over the next year in order to keep cholesterol down and stay off statin drugs.       Depression Screen PHQ 2/9 Scores 07/09/2019 05/11/2019 10/29/2018 07/30/2018  PHQ - 2 Score 0 0 0 0  PHQ- 9 Score - 0 0 0    Fall Risk Fall Risk  07/09/2019 05/11/2019 10/29/2018 07/30/2018 07/08/2018  Falls in the past year? 0 0 0 0 No  Number falls in past yr: 0 0 0 - -  Injury with Fall? 0 0 0 - -  Follow up Falls prevention discussed - - - -    FALL RISK  PREVENTION PERTAINING TO THE HOME:  Any stairs in or around the home? Yes  If so, do they handrails? Yes   Home free of loose throw rugs in walkways, pet beds, electrical cords, etc? Yes  Adequate lighting in your home to reduce risk of falls? Yes   ASSISTIVE DEVICES UTILIZED TO PREVENT FALLS:  Life alert? No  Use of a cane, walker or w/c? No  Grab bars in the bathroom? No  Shower chair or bench in shower? No  Elevated toilet seat or a handicapped toilet? Yes   DME ORDERS:  DME order needed?  No   TIMED UP AND GO:  Was the test performed? No . Telephonic visit.   Education: Fall risk prevention has been discussed.  Intervention(s) required? No   Cognitive Function: 6CIT deferred for 2020 AWV. Pt has no memory issues.         Screening Tests Health Maintenance  Topic Date Due  . PNA vac Low Risk Adult (1 of 2 - PCV13) 12/21/2017  . INFLUENZA VACCINE  04/11/2019  . TETANUS/TDAP  02/07/2021  . COLONOSCOPY  04/24/2023  . Hepatitis C Screening  Completed    Qualifies for Shingles Vaccine? Yes  Zostavax completed 2014. Due for Shingrix. Education has been provided regarding the importance of this vaccine. Pt has been advised to call insurance company to determine out of pocket expense. Advised may also receive vaccine at local pharmacy or Health Dept. Verbalized acceptance and understanding.  Tdap: Up to date  Flu Vaccine: Due for Flu vaccine. Does the patient want to receive this vaccine today?  No . Education has been provided regarding the importance of this vaccine but still declined. Advised may receive this vaccine at local pharmacy or Health Dept. Aware to provide a copy of the vaccination record if obtained from local pharmacy or Health Dept. Verbalized acceptance and understanding.  Pneumococcal Vaccine: Due for Pneumococcal vaccine. Does the patient want to receive this vaccine today?  No .  Education has been provided regarding the importance of this vaccine but  still declined. Advised may receive this vaccine at local pharmacy or Health Dept. Aware to provide a copy of the vaccination record if obtained from local pharmacy or Health Dept. Verbalized acceptance and understanding.   Cancer Screenings:  Colorectal Screening: Completed 04/23/13. Repeat every 10 years  Lung Cancer Screening: (Low Dose CT Chest recommended if Age 52-80 years, 30 pack-year currently smoking OR have quit w/in 15years.) does not qualify.    Additional Screening:  Hepatitis C Screening: does qualify; Completed 03/19/13  Vision Screening: Recommended annual ophthalmology exams for early detection of glaucoma and other disorders of the eye. Is the patient up to date with their annual eye exam?  No  Who is the provider or what is the name of the office in which the pt attends annual eye exams? Not established If pt is not established with a provider, would they like to be referred to a provider to establish care? No .  Dental Screening: Recommended annual dental exams for proper oral hygiene  Community Resource Referral:  CRR required this visit?  No       Plan:    I have personally reviewed and addressed the Medicare Annual Wellness questionnaire and have noted the following in the patient's chart:  A. Medical and social history B. Use of alcohol, tobacco or illicit drugs  C. Current medications and supplements D. Functional ability and status E.  Nutritional status F.  Physical activity G. Advance directives H. List of other physicians I.  Hospitalizations, surgeries, and ER visits in previous 12 months J.  Vitals K. Screenings such as hearing and vision if needed, cognitive and depression L. Referrals and appointments   In addition, I have reviewed and discussed with patient certain preventive protocols, quality metrics, and best practice recommendations. A written personalized care plan for preventive services as well as general preventive health  recommendations were provided to patient.   Signed,  Reather LittlerKasey Tyisha Cressy, LPN Nurse Health Advisor   Nurse Notes: pt advised due for yearly labs and will plan to schedule appt to see Danelle BerryLeisa Tapia PAC.

## 2019-07-15 ENCOUNTER — Other Ambulatory Visit: Payer: Self-pay

## 2019-07-15 DIAGNOSIS — I10 Essential (primary) hypertension: Secondary | ICD-10-CM

## 2019-07-16 MED ORDER — TELMISARTAN-HCTZ 80-25 MG PO TABS: 1.0000 | ORAL_TABLET | Freq: Every day | ORAL | 1 refills | Status: DC

## 2019-09-03 ENCOUNTER — Other Ambulatory Visit: Payer: Self-pay | Admitting: Family Medicine

## 2019-09-07 NOTE — Telephone Encounter (Signed)
Requested Prescriptions  Pending Prescriptions Disp Refills  . amLODipine (NORVASC) 5 MG tablet [Pharmacy Med Name: AMLODIPINE  5MG   TAB] 135 tablet 3    Sig: TAKE 1 AND 1/2 TABLETS BY  MOUTH DAILY     Cardiovascular:  Calcium Channel Blockers Failed - 09/03/2019 10:13 PM      Failed - Last BP in normal range    BP Readings from Last 1 Encounters:  07/09/19 140/73         Passed - Valid encounter within last 6 months    Recent Outpatient Visits          3 months ago Benign essential HTN   [place], NP   10 months ago Benign essential HTN   [place], Satira Anis, MD   1 year ago Benign essential HTN   [place], Satira Anis, MD   1 year ago Annual physical exam   [place], NP   1 year ago Benign essential HTN   [place], Satira Anis, MD      Future Appointments            In 2 weeks Hollice Espy, MD [place]   In 10 months  [facility]

## 2019-09-23 ENCOUNTER — Ambulatory Visit: Payer: Medicare Other | Admitting: Urology

## 2019-12-21 ENCOUNTER — Other Ambulatory Visit: Payer: Medicare Other

## 2019-12-21 ENCOUNTER — Other Ambulatory Visit: Payer: Self-pay

## 2019-12-21 DIAGNOSIS — R972 Elevated prostate specific antigen [PSA]: Secondary | ICD-10-CM

## 2019-12-22 LAB — PSA: Prostate Specific Ag, Serum: 6.9 ng/mL — ABNORMAL HIGH (ref 0.0–4.0)

## 2019-12-23 ENCOUNTER — Ambulatory Visit: Payer: Medicare Other | Admitting: Urology

## 2019-12-24 ENCOUNTER — Encounter: Payer: Self-pay | Admitting: Family Medicine

## 2019-12-24 ENCOUNTER — Ambulatory Visit (INDEPENDENT_AMBULATORY_CARE_PROVIDER_SITE_OTHER): Payer: Medicare Other | Admitting: Family Medicine

## 2019-12-24 ENCOUNTER — Other Ambulatory Visit: Payer: Self-pay

## 2019-12-24 VITALS — BP 122/72 | HR 80 | Temp 97.9°F | Resp 14 | Ht 72.0 in | Wt 285.2 lb

## 2019-12-24 DIAGNOSIS — I10 Essential (primary) hypertension: Secondary | ICD-10-CM | POA: Diagnosis not present

## 2019-12-24 DIAGNOSIS — Z0000 Encounter for general adult medical examination without abnormal findings: Secondary | ICD-10-CM

## 2019-12-24 DIAGNOSIS — E782 Mixed hyperlipidemia: Secondary | ICD-10-CM

## 2019-12-24 DIAGNOSIS — N4231 Prostatic intraepithelial neoplasia: Secondary | ICD-10-CM

## 2019-12-24 DIAGNOSIS — G4733 Obstructive sleep apnea (adult) (pediatric): Secondary | ICD-10-CM

## 2019-12-24 DIAGNOSIS — N5203 Combined arterial insufficiency and corporo-venous occlusive erectile dysfunction: Secondary | ICD-10-CM

## 2019-12-24 NOTE — Patient Instructions (Addendum)
Health Maintenance  Topic Date Due  . PNA vac Low Risk Adult (1 of 2 - PCV13) 12/23/2020 (Originally 12/21/2017)  . INFLUENZA VACCINE  04/10/2020  . TETANUS/TDAP  02/07/2021  . COLONOSCOPY  04/24/2023  . Hepatitis C Screening  Completed    Health Maintenance After Age 67 After age 70, you are at a higher risk for certain long-term diseases and infections as well as injuries from falls. Falls are a major cause of broken bones and head injuries in people who are older than age 75. Getting regular preventive care can help to keep you healthy and well. Preventive care includes getting regular testing and making lifestyle changes as recommended by your health care provider. Talk with your health care provider about:  Which screenings and tests you should have. A screening is a test that checks for a disease when you have no symptoms.  A diet and exercise plan that is right for you. What should I know about screenings and tests to prevent falls? Screening and testing are the best ways to find a health problem early. Early diagnosis and treatment give you the best chance of managing medical conditions that are common after age 54. Certain conditions and lifestyle choices may make you more likely to have a fall. Your health care provider may recommend:  Regular vision checks. Poor vision and conditions such as cataracts can make you more likely to have a fall. If you wear glasses, make sure to get your prescription updated if your vision changes.  Medicine review. Work with your health care provider to regularly review all of the medicines you are taking, including over-the-counter medicines. Ask your health care provider about any side effects that may make you more likely to have a fall. Tell your health care provider if any medicines that you take make you feel dizzy or sleepy.  Osteoporosis screening. Osteoporosis is a condition that causes the bones to get weaker. This can make the bones weak and  cause them to break more easily.  Blood pressure screening. Blood pressure changes and medicines to control blood pressure can make you feel dizzy.  Strength and balance checks. Your health care provider may recommend certain tests to check your strength and balance while standing, walking, or changing positions.  Foot health exam. Foot pain and numbness, as well as not wearing proper footwear, can make you more likely to have a fall.  Depression screening. You may be more likely to have a fall if you have a fear of falling, feel emotionally low, or feel unable to do activities that you used to do.  Alcohol use screening. Using too much alcohol can affect your balance and may make you more likely to have a fall. What actions can I take to lower my risk of falls? General instructions  Talk with your health care provider about your risks for falling. Tell your health care provider if: ? You fall. Be sure to tell your health care provider about all falls, even ones that seem minor. ? You feel dizzy, sleepy, or off-balance.  Take over-the-counter and prescription medicines only as told by your health care provider. These include any supplements.  Eat a healthy diet and maintain a healthy weight. A healthy diet includes low-fat dairy products, low-fat (lean) meats, and fiber from whole grains, beans, and lots of fruits and vegetables. Home safety  Remove any tripping hazards, such as rugs, cords, and clutter.  Install safety equipment such as grab bars in bathrooms and safety  rails on stairs.  Keep rooms and walkways well-lit. Activity   Follow a regular exercise program to stay fit. This will help you maintain your balance. Ask your health care provider what types of exercise are appropriate for you.  If you need a cane or walker, use it as recommended by your health care provider.  Wear supportive shoes that have nonskid soles. Lifestyle  Do not drink alcohol if your health care  provider tells you not to drink.  If you drink alcohol, limit how much you have: ? 0-1 drink a day for women. ? 0-2 drinks a day for men.  Be aware of how much alcohol is in your drink. In the [place] one drink equals one typical bottle of beer (12 oz), one-half glass of wine (5 oz), or one shot of hard liquor (1 oz).  Do not use any products that contain nicotine or tobacco, such as cigarettes and e-cigarettes. If you need help quitting, ask your health care provider. Summary  Having a healthy lifestyle and getting preventive care can help to protect your health and wellness after age 91.  Screening and testing are the best way to find a health problem early and help you avoid having a fall. Early diagnosis and treatment give you the best chance for managing medical conditions that are more common for people who are older than age 13.  Falls are a major cause of broken bones and head injuries in people who are older than age 62. Take precautions to prevent a fall at home.  Work with your health care provider to learn what changes you can make to improve your health and wellness and to prevent falls. This information is not intended to replace advice given to you by your health care provider. Make sure you discuss any questions you have with your health care provider. Document Revised: 12/18/2018 Document Reviewed: 07/10/2017 Elsevier Patient Education  [place].  Pneumococcal Polysaccharide Vaccine (PPSV23): What You Need to Know 1. Why get vaccinated? Pneumococcal polysaccharide vaccine (PPSV23) can prevent pneumococcal disease. Pneumococcal disease refers to any illness caused by pneumococcal bacteria. These bacteria can cause many types of illnesses, including pneumonia, which is an infection of the lungs. Pneumococcal bacteria are one of the most common causes of pneumonia. Besides pneumonia, pneumococcal bacteria can also cause:  Ear infections  Sinus  infections  Meningitis (infection of the tissue covering the brain and spinal cord)  Bacteremia (bloodstream infection) Anyone can get pneumococcal disease, but children under 40 years of age, people with certain medical conditions, adults 22 years or older, and cigarette smokers are at the highest risk. Most pneumococcal infections are mild. However, some can result in long-term problems, such as brain damage or hearing loss. Meningitis, bacteremia, and pneumonia caused by pneumococcal disease can be fatal. 2. PPSV23 PPSV23 protects against 23 types of bacteria that cause pneumococcal disease. PPSV23 is recommended for:  All adults 34 years or older,  Anyone 2 years or older with certain medical conditions that can lead to an increased risk for pneumococcal disease. Most people need only one dose of PPSV23. A second dose of PPSV23, and another type of pneumococcal vaccine called PCV13, are recommended for certain high-risk groups. Your health care provider can give you more information. People 65 years or older should get a dose of PPSV23 even if they have already gotten one or more doses of the vaccine before they turned 71. 3. Talk with your health care provider Tell your vaccine provider  if the person getting the vaccine:  Has had an allergic reaction after a previous dose of PPSV23, or has any severe, life-threatening allergies. In some cases, your health care provider may decide to postpone PPSV23 vaccination to a future visit. People with minor illnesses, such as a cold, may be vaccinated. People who are moderately or severely ill should usually wait until they recover before getting PPSV23. Your health care provider can give you more information. 4. Risks of a vaccine reaction  Redness or pain where the shot is given, feeling tired, fever, or muscle aches can happen after PPSV23. People sometimes faint after medical procedures, including vaccination. Tell your provider if you feel  dizzy or have vision changes or ringing in the ears. As with any medicine, there is a very remote chance of a vaccine causing a severe allergic reaction, other serious injury, or death. 5. What if there is a serious problem? An allergic reaction could occur after the vaccinated person leaves the clinic. If you see signs of a severe allergic reaction (hives, swelling of the face and throat, difficulty breathing, a fast heartbeat, dizziness, or weakness), call 9-1-1 and get the person to the nearest hospital. For other signs that concern you, call your health care provider. Adverse reactions should be reported to the Vaccine Adverse Event Reporting System (VAERS). Your health care provider will usually file this report, or you can do it yourself. Visit the VAERS website at www.vaers.LAgents.no or call (825) 785-5846. VAERS is only for reporting reactions, and VAERS staff do not give medical advice. 6. How can I learn more?  Ask your health care provider.  Call your local or state health department.  Contact the Centers for Disease Control and Prevention (CDC): ? Call 220-855-8190 (1-800-CDC-INFO) or ? Visit CDC's website at PicCapture.uy CDC Vaccine Information Statement PPSV23 Vaccine (07/09/2018) This information is not intended to replace advice given to you by your health care provider. Make sure you discuss any questions you have with your health care provider. Document Revised: 12/16/2018 Document Reviewed: 04/08/2018 Elsevier Patient Education  2020 [employer].

## 2019-12-24 NOTE — Progress Notes (Signed)
Patient: Warren [name] [place], Male    DOB: 1950/09/26, 69 y.o.   MRN: [id number] Kerman Passey, MD Visit Date: 12/24/2019  Today's Provider: Danelle Berry, PA-C   Chief Complaint  Patient presents with  . Annual Exam   Subjective:   Annual physical exam:  Warren [name] [place] is a 69 y.o. male who presents today for health maintenance and annual & complete physical exam  He feels fairly well. Wants to loose weight He reports exercising golfing 4x a week, walking Diet - avoids salt, trying to check labels for sodium, adding in more fish to diet He reports he is sleeping well.  OSA with CPAP sleeps 8 hours compliant with CPAP and feels well rested  Pt wished to discuss to do routine f/up on chronic conditions, pt has not been seen in person for over a year for chronic conditions, no labs since 2019, virtual/telephone visit in the past year for HTN and MWV.    Hypertension:  Currently managed on micardis 80-25 daily norvasc 5 mg Pt reports good med compliance and denies any SE.  No lightheadedness, hypotension, syncope. Blood pressure today is well controlled. BP Readings from Last 3 Encounters:  12/24/19 122/72  07/09/19 140/73  05/11/19 125/79  Pt denies CP, SOB, exertional sx, LE edema, palpitation, Ha's, visual disturbances Dietary efforts for BP?  Healthy, exercising  Hyperlipidemia: Current Medication Regimen:  Lifestyle, wants to avoid statin if he can with healthy life/diet choices Last Lipids: Lab Results  Component Value Date   CHOL 191 07/30/2018   HDL 38 ([name]) 07/30/2018   LDLCALC 128 (H) 07/30/2018   TRIG 132 07/30/2018   CHOLHDL 5.0 (H) 07/30/2018   - Current Diet:  See above - Denies: Chest pain, shortness of breath, myalgias. - Documented aortic atherosclerosis? No - Risk factors for atherosclerosis: hypercholesterolemia and hypertension   Obesity BMI 38, recently gained weight with pandemic with decreased activity  Wt Readings from Last 5 Encounters:    12/24/19 285 lb 3.2 oz (129.4 kg)  07/09/19 281 lb (127.5 kg)  05/11/19 275 lb (124.7 kg)  03/18/19 287 lb (130.2 kg)  10/29/18 280 lb 12.8 oz (127.4 kg)   BMI Readings from Last 5 Encounters:  12/24/19 38.68 kg/m  07/09/19 38.11 kg/m  05/11/19 37.30 kg/m  03/18/19 38.92 kg/m  10/29/18 38.08 kg/m    USPSTF grade A and B recommendations - reviewed and addressed today  Depression:  Phq 9 completed today by patient, was reviewed by me with patient in the room, score is  negative, pt feels good PHQ 2/9 Scores 12/24/2019 07/09/2019 05/11/2019 10/29/2018  PHQ - 2 Score 0 0 0 0  PHQ- 9 Score 0 - 0 0   Depression screen [facility] 2/9 12/24/2019 07/09/2019 05/11/2019 10/29/2018 07/30/2018  Decreased Interest 0 0 0 0 0  Down, Depressed, Hopeless 0 0 0 0 0  PHQ - 2 Score 0 0 0 0 0  Altered sleeping 0 - 0 0 0  Tired, decreased energy 0 - 0 0 0  Change in appetite 0 - 0 0 0  Feeling bad or failure about yourself  0 - 0 0 0  Trouble concentrating 0 - 0 0 0  Moving slowly or fidgety/restless 0 - 0 0 0  Suicidal thoughts 0 - 0 0 0  PHQ-9 Score 0 - 0 0 0  Difficult doing work/chores Not difficult at all - Not difficult at all Not difficult at all Not difficult at all    Hep  C Screening: done  STD testing and prevention (HIV/chl/gon/syphilis):  Not done in past and does not want today Prostate cancer:  Per urology with PSA elevated - biopsy monitoring PSA with urology No results found for: PSA Intimate partner violence: feels safe  Urinary Symptoms:  per  Urology  Advanced Care Planning:  A voluntary discussion about advance care planning including the explanation and discussion of advance directives.  Discussed health care proxy and Living will, and the patient was able to identify a health care proxy as his wife Warren [name].  Patient does not have a living will at present time. If patient does have living will, I have requested they bring this to the clinic to be scanned in to their  chart. Packet given, discussed Advanced directives today and info given  Skin cancer:  last skin survey was.  Pt reports no hx of skin cancer, suspicious lesions/biopsies in the past.  Colorectal cancer:  colonoscopy is UTD 2024, no sx  Lung cancer:   Low Dose CT Chest recommended if Age 51-80 years, 30 pack-year currently smoking OR have quit w/in 15years. Patient does not qualify.   Social History   Tobacco Use  . Smoking status: Never Smoker  . Smokeless tobacco: Never Used  Substance Use Topics  . Alcohol use: Yes    Alcohol/week: 0.0 standard drinks    Comment: 10/31/2016 "wine a couple times/month"     Alcohol screening:   Office Visit from 12/24/2019 in [facility]  AUDIT-C Score  0      AAA:  The USPSTF recommends one-time screening with ultrasonography in men ages 34 to 14 years who have ever smoked  ECG:   None indicated today   Blood pressure/Hypertension: BP Readings from Last 3 Encounters:  12/24/19 122/72  07/09/19 140/73  05/11/19 125/79   Weight/Obesity: Wt Readings from Last 3 Encounters:  12/24/19 285 lb 3.2 oz (129.4 kg)  07/09/19 281 lb (127.5 kg)  05/11/19 275 lb (124.7 kg)   BMI Readings from Last 3 Encounters:  12/24/19 38.68 kg/m  07/09/19 38.11 kg/m  05/11/19 37.30 kg/m    Lipids:  Lab Results  Component Value Date   CHOL 191 07/30/2018   CHOL 191 07/08/2018   CHOL 170 01/28/2018   Lab Results  Component Value Date   HDL 38 ([name]) 07/30/2018   HDL 37 ([name]) 07/08/2018   HDL 37 ([name]) 01/28/2018   Lab Results  Component Value Date   LDLCALC 128 (H) 07/30/2018   LDLCALC 120 (H) 07/08/2018   LDLCALC 118 (H) 01/28/2018   Lab Results  Component Value Date   TRIG 132 07/30/2018   TRIG 225 (H) 07/08/2018   TRIG 73 01/28/2018   Lab Results  Component Value Date   CHOLHDL 5.0 (H) 07/30/2018   CHOLHDL 5.2 (H) 07/08/2018   CHOLHDL 4.6 01/28/2018   No results found for: LDLDIRECT Based on the results of lipid panel  his/her cardiovascular risk factor ( using [facility] )  in the next 10 years is : The 10-year ASCVD risk score Mikey Bussing DC Brooke Bonito., et al., 2013) is: 16.9%   Values used to calculate the score:     Age: 30 years     Sex: Male     Is Non-Hispanic African American: Yes     Diabetic: No     Tobacco smoker: No     Systolic Blood Pressure: 702 mmHg     Is BP treated: Yes     HDL Cholesterol:  38 mg/dL     Total Cholesterol: 191 mg/dL Glucose:  Glucose  Date Value Ref Range Status  01/28/2018 87 65 - 99 mg/dL Final   Glucose, Bld  Date Value Ref Range Status  07/08/2018 80 65 - 139 mg/dL Final    Comment:    .        Non-fasting reference interval .   12/16/2017 93 65 - 99 mg/dL Final    Comment:    .            Fasting reference interval .   11/01/2016 105 (H) 65 - 99 mg/dL Final    Social History      He  reports that he has never smoked. He has never used smokeless tobacco. He reports current alcohol use. He reports that he does not use drugs.       Social History   Socioeconomic History  . Marital status: Married    Spouse name: Not on file  . Number of children: 2  . Years of education: Not on file  . Highest education level: Not on file  Occupational History  . Not on file  Tobacco Use  . Smoking status: Never Smoker  . Smokeless tobacco: Never Used  Substance and Sexual Activity  . Alcohol use: Yes    Alcohol/week: 0.0 standard drinks    Comment: 10/31/2016 "wine a couple times/month"  . Drug use: No  . Sexual activity: Yes    Partners: Female  Other Topics Concern  . Not on file  Social History Narrative  . Not on file   Social Determinants of Health   Financial Resource Strain:   . Difficulty of Paying Living Expenses:   Food Insecurity:   . Worried About Programme researcher, broadcasting/film/video in the Last Year:   . Barista in the Last Year:   Transportation Needs:   . Freight forwarder (Medical):   Marland Kitchen Lack of Transportation (Non-Medical):   Physical  Activity: Unknown  . Days of Exercise per Week: 3 days  . Minutes of Exercise per Session: Not on file  Stress:   . Feeling of Stress :   Social Connections:   . Frequency of Communication with Friends and Family:   . Frequency of Social Gatherings with Friends and Family:   . Attends Religious Services:   . Active Member of Clubs or Organizations:   . Attends Banker Meetings:   Marland Kitchen Marital Status:          Family History        Family Status  Relation Name Status  . Mother  Deceased       stroke  . Father  Deceased       suscide  . Sister youngest Alive  . Brother  Alive  . MGM  Deceased  . MGF  Deceased  . PGM  Deceased  . PGF  Deceased  . Sister  Alive  . Brother  Alive        His family history includes Cancer in his sister; Heart disease in his mother; Stroke in his mother; Suicidality in his father.       Family History  Problem Relation Age of Onset  . Heart disease Mother   . Stroke Mother   . Suicidality Father   . Cancer Sister        breast    Patient Active Problem List   Diagnosis Date Noted  . Morbid obesity (HCC) 07/30/2018  .  PIN (prostatic intraepithelial neoplasia) 09/05/2017  . Elevated PSA 06/07/2017  . Abnormal EKG 08/06/2016  . Erectile dysfunction 02/27/2016  . Obstructive sleep apnea of adult 02/24/2015  . Flat foot 07/05/2008  . Allergic rhinitis 01/12/2008  . Hyperlipidemia 10/08/2007  . Benign essential HTN 08/13/2007    Past Surgical History:  Procedure Laterality Date  . HAMMER TOE SURGERY Bilateral 11/2015 - 01/2016   "right-left"  . JOINT REPLACEMENT    . SOFT TISSUE BIOPSY Right ~ 1986   "@ [facility]"  . TOTAL KNEE ARTHROPLASTY Right 08/22/2016   Procedure: TOTAL KNEE ARTHROPLASTY;  Surgeon: Loreta Aveaniel F Murphy, MD;  [facility];  Service: Orthopedics;  Laterality: Right;  . TOTAL KNEE ARTHROPLASTY Left 10/31/2016  . TOTAL KNEE ARTHROPLASTY Left 10/31/2016   Procedure: TOTAL KNEE ARTHROPLASTY;  Surgeon: Loreta Aveaniel F  Murphy, MD;  [facility];  Service: Orthopedics;  Laterality: Left;     Current Outpatient Medications:  .  amLODipine (NORVASC) 5 MG tablet, TAKE 1 AND 1/2 TABLETS BY  MOUTH DAILY, Disp: 135 tablet, Rfl: 3 .  aspirin EC 81 MG tablet, Take 1 tablet (81 mg total) by mouth daily., Disp: , Rfl:  .  Omega-3 Fatty Acids (FISH OIL) 1200 MG CAPS, Take 1,200 mg by mouth., Disp: , Rfl:  .  tadalafil (CIALIS) 5 MG tablet, Take 1 tablet (5 mg total) by mouth daily., Disp: 90 tablet, Rfl: 3 .  telmisartan-hydrochlorothiazide (MICARDIS HCT) 80-25 MG tablet, Take 1 tablet by mouth daily. for blood pressure, Disp: 90 tablet, Rfl: 1  No Known Allergies  Patient Care Team: Kerman PasseyLada, Melinda P, MD as PCP - General (Family Medicine) Vanna ScotlandBrandon, Ashley, MD as Consulting Physician (Urology)  I personally reviewed active problem list, medication list, allergies, family history, social history, health maintenance, notes from last encounter, lab results, imaging with the patient/caregiver today.   Review of Systems  Constitutional: Negative.  Negative for activity change, appetite change, fatigue and unexpected weight change.  HENT: Negative.   Eyes: Negative.   Respiratory: Negative.  Negative for shortness of breath.   Cardiovascular: Negative.  Negative for chest pain, palpitations and leg swelling.  Gastrointestinal: Negative.  Negative for abdominal pain and blood in stool.  Endocrine: Negative.   Genitourinary: Negative.  Negative for decreased urine volume, difficulty urinating, testicular pain and urgency.  Skin: Negative.  Negative for color change and pallor.  Allergic/Immunologic: Negative.   Neurological: Negative.  Negative for syncope, weakness, light-headedness and numbness.  Psychiatric/Behavioral: Negative.  Negative for confusion, dysphoric mood, self-injury and suicidal ideas. The patient is not nervous/anxious.   All other systems reviewed and are negative.         Objective:    Vitals:  Vitals:   12/24/19 1020  BP: 122/72  Pulse: 80  Resp: 14  Temp: 97.9 F (36.6 C)  SpO2: 95%  Weight: 285 lb 3.2 oz (129.4 kg)  Height: 6' (1.829 m)    Body mass index is 38.68 kg/m.  Physical Exam Vitals and nursing note reviewed.  Constitutional:      General: He is not in acute distress.    Appearance: Normal appearance. He is well-developed. He is obese. He is not ill-appearing, toxic-appearing or diaphoretic.  HENT:     Head: Normocephalic and atraumatic.     Jaw: No trismus.     Right Ear: Tympanic membrane, ear canal and external ear normal.     Left Ear: Tympanic membrane, ear canal and external ear normal.     Nose:  Nose normal. No mucosal edema or rhinorrhea.     Right Sinus: No maxillary sinus tenderness or frontal sinus tenderness.     Left Sinus: No maxillary sinus tenderness or frontal sinus tenderness.     Mouth/Throat:     Mouth: Mucous membranes are moist.     Pharynx: Uvula midline. Posterior oropharyngeal erythema present. No oropharyngeal exudate or uvula swelling.  Eyes:     General: Lids are normal.     Conjunctiva/sclera: Conjunctivae normal.     Pupils: Pupils are equal, round, and reactive to light.  Neck:     Thyroid: No thyroid mass, thyromegaly or thyroid tenderness.     Trachea: Trachea and phonation normal. No tracheal deviation.  Cardiovascular:     Rate and Rhythm: Regular rhythm.     Pulses: Normal pulses.          Radial pulses are 2+ on the right side and 2+ on the left side.       Posterior tibial pulses are 2+ on the right side and 2+ on the left side.     Heart sounds: Normal heart sounds. No murmur. No friction rub. No gallop.   Pulmonary:     Effort: Pulmonary effort is normal.     Breath sounds: Normal breath sounds. No wheezing, rhonchi or rales.  Abdominal:     General: Bowel sounds are normal. There is no distension.     Palpations: Abdomen is soft.     Tenderness: There is no abdominal tenderness. There is no  right CVA tenderness, left CVA tenderness, guarding or rebound.  Musculoskeletal:        General: Normal range of motion.     Cervical back: Normal range of motion and neck supple.  Lymphadenopathy:     Cervical: No cervical adenopathy.  Skin:    General: Skin is warm and dry.     Capillary Refill: Capillary refill takes less than 2 seconds.     Findings: No rash.  Neurological:     Mental Status: He is alert.     Gait: Gait normal.  Psychiatric:        Mood and Affect: Mood normal.        Speech: Speech normal.        Behavior: Behavior normal.      Recent Results (from the past 2160 hour(s))  PSA     Status: Abnormal   Collection Time: 12/21/19  8:37 AM  Result Value Ref Range   Prostate Specific Ag, Serum 6.9 (H) 0.0 - 4.0 ng/mL    Comment: Roche ECLIA methodology. According to the American Urological Association, Serum PSA should decrease and remain at undetectable levels after radical prostatectomy. The AUA defines biochemical recurrence as an initial PSA value 0.2 ng/mL or greater followed by a subsequent confirmatory PSA value 0.2 ng/mL or greater. Values obtained with different assay methods or kits cannot be used interchangeably. Results cannot be interpreted as absolute evidence of the presence or absence of malignant disease.      PHQ2/9: Depression screen [facility] 2/9 12/24/2019 07/09/2019 05/11/2019 10/29/2018 07/30/2018  Decreased Interest 0 0 0 0 0  Down, Depressed, Hopeless 0 0 0 0 0  PHQ - 2 Score 0 0 0 0 0  Altered sleeping 0 - 0 0 0  Tired, decreased energy 0 - 0 0 0  Change in appetite 0 - 0 0 0  Feeling bad or failure about yourself  0 - 0 0 0  Trouble concentrating 0 -  0 0 0  Moving slowly or fidgety/restless 0 - 0 0 0  Suicidal thoughts 0 - 0 0 0  PHQ-9 Score 0 - 0 0 0  Difficult doing work/chores Not difficult at all - Not difficult at all Not difficult at all Not difficult at all    Fall Risk: Fall Risk  12/24/2019 07/09/2019 05/11/2019 10/29/2018  07/30/2018  Falls in the past year? 0 0 0 0 0  Number falls in past yr: 0 0 0 0 -  Injury with Fall? 0 0 0 0 -  Follow up - Falls prevention discussed - - -    Functional Status Survey: Is the patient deaf or have difficulty hearing?: No Does the patient have difficulty seeing, even when wearing glasses/contacts?: No Does the patient have difficulty concentrating, remembering, or making decisions?: No Does the patient have difficulty walking or climbing stairs?: No Does the patient have difficulty dressing or bathing?: No Does the patient have difficulty doing errands alone such as visiting a doctor's office or shopping?: No   Assessment & Plan:    CPE completed today   . Prostate cancer screening and PSA options (with potential risks and benefits of testing vs not testing) were discussed along with recent recs/guidelines, shared decision making and handout/information given to pt today  . USPSTF grade A and B recommendations reviewed with patient; age-appropriate recommendations, preventive care, screening tests, etc discussed and encouraged; healthy living encouraged; see AVS for patient education given to patient  . Discussed importance of 150 minutes of physical activity weekly, AHA exercise recommendations given to pt in AVS/handout  . Discussed importance of healthy diet:  eating lean meats and proteins, avoiding trans fats and saturated fats, avoid simple sugars and excessive carbs in diet, eat 6 servings of fruit/vegetables daily and drink plenty of water and avoid sweet beverages.  DASH diet reviewed if pt has HTN  . Recommended pt to do annual eye exam and routine dental exams/cleanings  . Reviewed Health Maintenance: Health Maintenance  Topic Date Due  . PNA vac Low Risk Adult (1 of 2 - PCV13) 12/23/2020 (Originally 12/21/2017)  . INFLUENZA VACCINE  04/10/2020  . TETANUS/TDAP  02/07/2021  . COLONOSCOPY  04/24/2023  . COVID-19 Vaccine  Completed  . Hepatitis C  Screening  Completed    . Immunizations: Immunization History  Administered Date(s) Administered  . Influenza, High Dose Seasonal PF 07/08/2018  . Influenza, Seasonal, Injecte, Preservative Fre 04/10/2012, 06/24/2013  . Influenza,inj,Quad PF,6+ Mos 08/06/2016, 06/06/2017  . PFIZER SARS-COV-2 Vaccination 10/12/2019, 11/05/2019  . Tdap 02/08/2011  . Zoster 03/19/2013  Patient refuses pneumonia vaccinations he was given information regarding this and encouraged to come in for nurse visit if he is willing to get the vaccinations he is due for PCV 13 and PPV23    1. Adult general medical exam - Lipid panel - COMPLETE METABOLIC PANEL WITH GFR - CBC with Differential/Platelet  2. Morbid obesity (HCC) Encouraged him to continue to work on diet, decreased calories, increase nutrition, increased exercise  3. Mixed hyperlipidemia History of high cholesterol not on medication, patient states that he had not been able to improve in the past with diet and exercise from what I can see with reviewing his labs he has had continuously elevated cholesterol, we did discuss diet lifestyle changes he is given handouts regarding this from American Heart Association was also given information about statin medications.  We reviewed his ASCVD risk score and I did encourage him to consider taking a  statin with risk of 16.9% - Lipid panel - COMPLETE METABOLIC PANEL WITH GFR  4. Benign essential HTN Blood pressure well controlled and stable on Norvasc 5 mg and telmisartan hydrochlorothiazide 80-25 - Lipid panel - COMPLETE METABOLIC PANEL WITH GFR  5. PIN (prostatic intraepithelial neoplasia) Per urology  6. Combined arterial insufficiency and corporo-venous occlusive erectile dysfunction Has Cialis per urology  7. Obstructive sleep apnea of adult History of sleep apnea on CPAP states he is compliant endorses good quality sleep feels well rested - CBC with Differential/Platelet    Danelle Berry,  PA-C 12/24/19 10:45 AM  [facility]

## 2019-12-25 LAB — LIPID PANEL
Cholesterol: 208 mg/dL — ABNORMAL HIGH (ref <200)
HDL: 39 mg/dL — ABNORMAL LOW (ref >=40)
LDL Cholesterol (Calc): 138 mg/dL (calc) — ABNORMAL HIGH
Non-HDL Cholesterol (Calc): 169 mg/dL (calc) — ABNORMAL HIGH (ref <130)
Total CHOL/HDL Ratio: 5.3 (calc) — ABNORMAL HIGH (ref <5.0)
Triglycerides: 176 mg/dL — ABNORMAL HIGH (ref <150)

## 2019-12-25 LAB — COMPLETE METABOLIC PANEL WITH GFR
AG Ratio: 1.7 (calc) (ref 1.0–2.5)
ALT: 20 U/L (ref 9–46)
AST: 18 U/L (ref 10–35)
Albumin: 4.4 g/dL (ref 3.6–5.1)
Alkaline phosphatase (APISO): 43 U/L (ref 35–144)
BUN: 18 mg/dL (ref 7–25)
CO2: 30 mmol/L (ref 20–32)
Calcium: 9.3 mg/dL (ref 8.6–10.3)
Chloride: 105 mmol/L (ref 98–110)
Creat: 1.07 mg/dL (ref 0.70–1.25)
GFR, Est African American: 83 mL/min/{1.73_m2} (ref >=60)
GFR, Est Non African American: 71 mL/min/{1.73_m2} (ref >=60)
Globulin: 2.6 g/dL (calc) (ref 1.9–3.7)
Glucose, Bld: 72 mg/dL (ref 65–99)
Potassium: 3.8 mmol/L (ref 3.5–5.3)
Sodium: 143 mmol/L (ref 135–146)
Total Bilirubin: 0.4 mg/dL (ref 0.2–1.2)
Total Protein: 7 g/dL (ref 6.1–8.1)

## 2019-12-25 LAB — CBC WITH DIFFERENTIAL/PLATELET
Absolute Monocytes: 551 cells/uL (ref 200–950)
Basophils Absolute: 61 cells/uL (ref 0–200)
Basophils Relative: 0.9 %
Eosinophils Absolute: 320 cells/uL (ref 15–500)
Eosinophils Relative: 4.7 %
HCT: 45.2 % (ref 38.5–50.0)
Hemoglobin: 15.2 g/dL (ref 13.2–17.1)
Lymphs Abs: 2108 cells/uL (ref 850–3900)
MCH: 29.6 pg (ref 27.0–33.0)
MCHC: 33.6 g/dL (ref 32.0–36.0)
MCV: 87.9 fL (ref 80.0–100.0)
MPV: 10.2 fL (ref 7.5–12.5)
Monocytes Relative: 8.1 %
Neutro Abs: 3760 cells/uL (ref 1500–7800)
Neutrophils Relative %: 55.3 %
Platelets: 273 10*3/uL (ref 140–400)
RBC: 5.14 10*6/uL (ref 4.20–5.80)
RDW: 12.4 % (ref 11.0–15.0)
Total Lymphocyte: 31 %
WBC: 6.8 10*3/uL (ref 3.8–10.8)

## 2019-12-28 MED ORDER — ROSUVASTATIN CALCIUM 10 MG PO TABS: 10.0000 mg | ORAL_TABLET | Freq: Every day | ORAL | 3 refills | Status: DC

## 2020-01-15 ENCOUNTER — Telehealth: Payer: Self-pay | Admitting: *Deleted

## 2020-01-15 NOTE — Telephone Encounter (Addendum)
Left VM to return call. Scheduled lab visit.  ----- Message from Vanna Scotland, MD sent at 01/15/2020  8:58 AM EDT ----- PSA up slightly.  Plan to recheck at 6 month visit as scheduled.  Vanna Scotland, MD

## 2020-01-26 ENCOUNTER — Other Ambulatory Visit: Payer: Self-pay | Admitting: Emergency Medicine

## 2020-01-26 DIAGNOSIS — I10 Essential (primary) hypertension: Secondary | ICD-10-CM

## 2020-01-26 MED ORDER — TELMISARTAN-HCTZ 80-25 MG PO TABS: 1.0000 | ORAL_TABLET | Freq: Every day | ORAL | 3 refills | Status: DC

## 2020-02-05 ENCOUNTER — Other Ambulatory Visit: Payer: Self-pay | Admitting: Urology

## 2020-02-10 ENCOUNTER — Telehealth: Payer: Self-pay | Admitting: Family Medicine

## 2020-02-10 ENCOUNTER — Other Ambulatory Visit: Payer: Self-pay | Admitting: Urology

## 2020-02-10 NOTE — Chronic Care Management (AMB) (Signed)
  Chronic Care Management   Outreach Note  02/10/2020 Name: [name] [place] MRN: [id number] DOB: 1951-06-07  Warren [name] [place] is a 69 y.o. year old male who is a primary care patient of Danelle Berry, [place]. I reached out to Klinton [name] [place] by phone today in response to a referral sent by Mr. Lily Lovings Gueye's health plan.     An unsuccessful telephone outreach was attempted today. The patient was referred to the case management team for assistance with care management and care coordination.   Follow Up Plan: A HIPPA compliant phone message was left for the patient providing contact information and requesting a return call.  The care management team will reach out to the patient again over the next 7 days.  If patient returns call to provider office, please advise to call Embedded Care Management Care Guide Penne Lash at [phone]  Penne Lash, RMA Care Guide, Embedded Care Coordination [facility]  [place], [place] [address] Direct Dial: [phone] [email or web address] Website: [email or web address]

## 2020-02-15 NOTE — Chronic Care Management (AMB) (Signed)
  Chronic Care Management   Note  02/15/2020 Name: Amedee L Costa [name] MRN: [id number] DOB: 09-14-1950  Warren [name] is a 69 y.o. year old male who is a primary care patient of Delsa Grana, [place]. I reached out to Mercury L Costa [name] by phone today in response to a referral sent by Mr. Reesa Chew Kable's health plan.     [name] was given information about Chronic Care Management services today including:  1. CCM service includes personalized support from designated clinical staff supervised by his physician, including individualized plan of care and coordination with other care providers 2. 24/7 contact phone numbers for assistance for urgent and routine care needs. 3. Service will only be billed when office clinical staff spend 20 minutes or more in a month to coordinate care. 4. Only one practitioner may furnish and bill the service in a calendar month. 5. The patient may stop CCM services at any time (effective at the end of the month) by phone call to the office staff. 6. The patient will be responsible for cost sharing (co-pay) of up to 20% of the service fee (after annual deductible is met).  Patient did not agree to enrollment in care management services and does not wish to consider at this time.  Follow up plan: The patient has been provided with contact information for the care management team and has been advised to call with any health related questions or concerns.   Noreene Larsson, [place], [place], [place] [address] Direct Dial: [phone] [email or web address] Website: [email or web address]

## 2020-02-15 NOTE — Chronic Care Management (AMB) (Signed)
  Chronic Care Management   Outreach Note  02/15/2020 Name: Warren [name] MRN: [id number] DOB: 05/06/1951  Warren [name] is a 69 y.o. year old male who is a primary care patient of Danelle Berry, [place]. I reached out to Warren [name] by phone today in response to a referral sent by Mr. Lily Lovings [place]um's health plan.     A second unsuccessful telephone outreach was attempted today. The patient was referred to the case management team for assistance with care management and care coordination.   Follow Up Plan: A HIPPA compliant phone message was left for the patient providing contact information and requesting a return call.  The care management team will reach out to the patient again over the next 7 days.  If patient returns call to provider office, please advise to call Embedded Care Management Care Guide Arby Dahir  at [phone]  Penne Lash, RMA Care Guide, Embedded Care Coordination [facility]  [place], [place] [address] Direct Dial: [phone] [email or web address]: [email or web address]

## 2020-03-15 ENCOUNTER — Other Ambulatory Visit: Payer: Self-pay | Admitting: *Deleted

## 2020-03-15 DIAGNOSIS — R972 Elevated prostate specific antigen [PSA]: Secondary | ICD-10-CM

## 2020-03-21 ENCOUNTER — Other Ambulatory Visit: Payer: Self-pay

## 2020-03-23 ENCOUNTER — Ambulatory Visit: Payer: Medicare Other | Admitting: Urology

## 2020-06-27 ENCOUNTER — Ambulatory Visit: Payer: Medicare Other | Admitting: Family Medicine

## 2020-07-12 ENCOUNTER — Ambulatory Visit (INDEPENDENT_AMBULATORY_CARE_PROVIDER_SITE_OTHER): Payer: 59

## 2020-07-12 ENCOUNTER — Other Ambulatory Visit: Payer: Self-pay

## 2020-07-12 VITALS — BP 132/80 | HR 70 | Temp 98.0°F | Resp 16 | Ht 72.0 in | Wt 285.2 lb

## 2020-07-12 DIAGNOSIS — Z0000 Encounter for general adult medical examination without abnormal findings: Secondary | ICD-10-CM

## 2020-07-12 NOTE — Patient Instructions (Signed)
Warren [name] , Thank you for taking time to come for your Medicare Wellness Visit. I appreciate your ongoing commitment to your health goals. Please review the following plan we discussed and let me know if I can assist you in the future.   Screening recommendations/referrals: Colonoscopy: done 04/23/13. Repeat in 2024 Recommended yearly ophthalmology/optometry visit for glaucoma screening and checkup Recommended yearly dental visit for hygiene and checkup  Vaccinations: Influenza vaccine: declined Pneumococcal vaccine: declined Tdap vaccine: done 02/08/11 Shingles vaccine: Shingrix discussed. Please contact your pharmacy for coverage information.  Covid-19: done 10/12/19, 11/05/19 & 06/30/20  Advanced directives: Advance directive discussed with you today. I have provided a copy for you to complete at home and have notarized. Once this is complete please bring a copy in to our office so we can scan it into your chart.  Conditions/risks identified: Recommend healthy eating and physical activity to maintain cholesterol  Next appointment: Follow up in one year for your annual wellness visit.   Preventive Care 9 Years and Older, Male Preventive care refers to lifestyle choices and visits with your health care provider that can promote health and wellness. What does preventive care include?  A yearly physical exam. This is also called an annual well check.  Dental exams once or twice a year.  Routine eye exams. Ask your health care provider how often you should have your eyes checked.  Personal lifestyle choices, including:  Daily care of your teeth and gums.  Regular physical activity.  Eating a healthy diet.  Avoiding tobacco and drug use.  Limiting alcohol use.  Practicing safe sex.  Taking low doses of aspirin every day.  Taking vitamin and mineral supplements as recommended by your health care provider. What happens during an annual well check? The services and screenings  done by your health care provider during your annual well check will depend on your age, overall health, lifestyle risk factors, and family history of disease. Counseling  Your health care provider may ask you questions about your:  Alcohol use.  Tobacco use.  Drug use.  Emotional well-being.  Home and relationship well-being.  Sexual activity.  Eating habits.  History of falls.  Memory and ability to understand (cognition).  Work and work Astronomer. Screening  You may have the following tests or measurements:  Height, weight, and BMI.  Blood pressure.  Lipid and cholesterol levels. These may be checked every 5 years, or more frequently if you are over 41 years old.  Skin check.  Lung cancer screening. You may have this screening every year starting at age 47 if you have a 30-pack-year history of smoking and currently smoke or have quit within the past 15 years.  Fecal occult blood test (FOBT) of the stool. You may have this test every year starting at age 10.  Flexible sigmoidoscopy or colonoscopy. You may have a sigmoidoscopy every 5 years or a colonoscopy every 10 years starting at age 59.  Prostate cancer screening. Recommendations will vary depending on your family history and other risks.  Hepatitis C blood test.  Hepatitis B blood test.  Sexually transmitted disease (STD) testing.  Diabetes screening. This is done by checking your blood sugar (glucose) after you have not eaten for a while (fasting). You may have this done every 1-3 years.  Abdominal aortic aneurysm (AAA) screening. You may need this if you are a current or former smoker.  Osteoporosis. You may be screened starting at age 103 if you are at high risk. Talk  with your health care provider about your test results, treatment options, and if necessary, the need for more tests. Vaccines  Your health care provider may recommend certain vaccines, such as:  Influenza vaccine. This is recommended  every year.  Tetanus, diphtheria, and acellular pertussis (Tdap, Td) vaccine. You may need a Td booster every 10 years.  Zoster vaccine. You may need this after age 11.  Pneumococcal 13-valent conjugate (PCV13) vaccine. One dose is recommended after age 34.  Pneumococcal polysaccharide (PPSV23) vaccine. One dose is recommended after age 83. Talk to your health care provider about which screenings and vaccines you need and how often you need them. This information is not intended to replace advice given to you by your health care provider. Make sure you discuss any questions you have with your health care provider. Document Released: 09/23/2015 Document Revised: 05/16/2016 Document Reviewed: 06/28/2015 Elsevier Interactive Patient Education  2017 [place] Prevention in the Home Falls can cause injuries. They can happen to people of all ages. There are many things you can do to make your home safe and to help prevent falls. What can I do on the outside of my home?  Regularly fix the edges of walkways and driveways and fix any cracks.  Remove anything that might make you trip as you walk through a door, such as a raised step or threshold.  Trim any bushes or trees on the path to your home.  Use bright outdoor lighting.  Clear any walking paths of anything that might make someone trip, such as rocks or tools.  Regularly check to see if handrails are loose or broken. Make sure that both sides of any steps have handrails.  Any raised decks and porches should have guardrails on the edges.  Have any leaves, snow, or ice cleared regularly.  Use sand or salt on walking paths during winter.  Clean up any spills in your garage right away. This includes oil or grease spills. What can I do in the bathroom?  Use night lights.  Install grab bars by the toilet and in the tub and shower. Do not use towel bars as grab bars.  Use non-skid mats or decals in the tub or shower.  If  you need to sit down in the shower, use a plastic, non-slip stool.  Keep the floor dry. Clean up any water that spills on the floor as soon as it happens.  Remove soap buildup in the tub or shower regularly.  Attach bath mats securely with double-sided non-slip rug tape.  Do not have throw rugs and other things on the floor that can make you trip. What can I do in the bedroom?  Use night lights.  Make sure that you have a light by your bed that is easy to reach.  Do not use any sheets or blankets that are too big for your bed. They should not hang down onto the floor.  Have a firm chair that has side arms. You can use this for support while you get dressed.  Do not have throw rugs and other things on the floor that can make you trip. What can I do in the kitchen?  Clean up any spills right away.  Avoid walking on wet floors.  Keep items that you use a lot in easy-to-reach places.  If you need to reach something above you, use a strong step stool that has a grab bar.  Keep electrical cords out of the way.  Do  not use floor polish or wax that makes floors slippery. If you must use wax, use non-skid floor wax.  Do not have throw rugs and other things on the floor that can make you trip. What can I do with my stairs?  Do not leave any items on the stairs.  Make sure that there are handrails on both sides of the stairs and use them. Fix handrails that are broken or loose. Make sure that handrails are as long as the stairways.  Check any carpeting to make sure that it is firmly attached to the stairs. Fix any carpet that is loose or worn.  Avoid having throw rugs at the top or bottom of the stairs. If you do have throw rugs, attach them to the floor with carpet tape.  Make sure that you have a light switch at the top of the stairs and the bottom of the stairs. If you do not have them, ask someone to add them for you. What else can I do to help prevent falls?  Wear shoes  that:  Do not have high heels.  Have rubber bottoms.  Are comfortable and fit you well.  Are closed at the toe. Do not wear sandals.  If you use a stepladder:  Make sure that it is fully opened. Do not climb a closed stepladder.  Make sure that both sides of the stepladder are locked into place.  Ask someone to hold it for you, if possible.  Clearly mark and make sure that you can see:  Any grab bars or handrails.  First and last steps.  Where the edge of each step is.  Use tools that help you move around (mobility aids) if they are needed. These include:  Canes.  Walkers.  Scooters.  Crutches.  Turn on the lights when you go into a dark area. Replace any light bulbs as soon as they burn out.  Set up your furniture so you have a clear path. Avoid moving your furniture around.  If any of your floors are uneven, fix them.  If there are any pets around you, be aware of where they are.  Review your medicines with your doctor. Some medicines can make you feel dizzy. This can increase your chance of falling. Ask your doctor what other things that you can do to help prevent falls. This information is not intended to replace advice given to you by your health care provider. Make sure you discuss any questions you have with your health care provider. Document Released: 06/23/2009 Document Revised: 02/02/2016 Document Reviewed: 10/01/2014 Elsevier Interactive Patient Education  2017 [employer].

## 2020-07-12 NOTE — Progress Notes (Signed)
Subjective:   Jaxten L [place] is a 69 y.o. male who presents for Medicare Annual/Subsequent preventive examination.  Review of Systems     Cardiac Risk Factors include: advanced age (>99men, >44 women);male gender;dyslipidemia;hypertension;obesity (BMI >30kg/m2)     Objective:    Today's Vitals   07/12/20 0927  BP: 132/80  Pulse: 70  Resp: 16  Temp: 98 F (36.7 C)  TempSrc: Oral  SpO2: 97%  Weight: 285 lb 3.2 oz (129.4 kg)  Height: 6' (1.829 m)   Body mass index is 38.68 kg/m.  Advanced Directives 07/12/2020 07/09/2019 07/03/2017 06/06/2017 10/31/2016 10/30/2016 10/17/2016  Does Patient Have a Medical Advance Directive? No No No No No No No  Type of Advance Directive - - - - - - -  Would patient like information on creating a medical advance directive? Yes (MAU/Ambulatory/Procedural Areas - Information given) No - Patient declined - - No - Patient declined - No - Patient declined    Current Medications (verified) Outpatient Encounter Medications as of 07/12/2020  Medication Sig  . amLODipine (NORVASC) 5 MG tablet TAKE 1 AND 1/2 TABLETS BY  MOUTH DAILY  . aspirin EC 81 MG tablet Take 1 tablet (81 mg total) by mouth daily.  . Omega-3 Fatty Acids (FISH OIL) 1200 MG CAPS Take 1,200 mg by mouth.  . tadalafil (CIALIS) 5 MG tablet TAKE ONE TABLET BY MOUTH DAILY  . telmisartan-hydrochlorothiazide (MICARDIS HCT) 80-25 MG tablet Take 1 tablet by mouth daily. for blood pressure  . rosuvastatin (CRESTOR) 10 MG tablet Take 1 tablet (10 mg total) by mouth daily. (Patient not taking: Reported on 07/12/2020)   No facility-administered encounter medications on file as of 07/12/2020.    Allergies (verified) Patient has no known allergies.   History: Past Medical History:  Diagnosis Date  . Elevated PSA 06/07/2017   6.2 -- refer to urologist Sept 2018  . Erectile dysfunction 02/27/2016  . Hypercholesterolemia without hypertriglyceridemia 10/08/2007  . Hypertension   . Malignant  hyperthermia 1986   "told I had maybe a mild case of imalignant hyperthermia at [facility]"  . Obesity 02/24/2015  . OSA on CPAP 02/24/2015   CPAP q night   . Osteoarthritis    "feet and knees"  (10/31/2016)  . PIN (prostatic intraepithelial neoplasia) 09/05/2017   Prostate biopsy Nov 2018   Past Surgical History:  Procedure Laterality Date  . HAMMER TOE SURGERY Bilateral 11/2015 - 01/2016   "right-left"  . JOINT REPLACEMENT    . SOFT TISSUE BIOPSY Right ~ 1986   "@ [facility]"  . TOTAL KNEE ARTHROPLASTY Right 08/22/2016   Procedure: TOTAL KNEE ARTHROPLASTY;  Surgeon: Loreta Ave, MD;  Location: [facility] OR;  Service: Orthopedics;  Laterality: Right;  . TOTAL KNEE ARTHROPLASTY Left 10/31/2016  . TOTAL KNEE ARTHROPLASTY Left 10/31/2016   Procedure: TOTAL KNEE ARTHROPLASTY;  Surgeon: Loreta Ave, MD;  Location: [facility] OR;  Service: Orthopedics;  Laterality: Left;   Family History  Problem Relation Age of Onset  . Heart disease Mother   . Stroke Mother   . Suicidality Father   . Cancer Sister        breast   Social History   Socioeconomic History  . Marital status: Married    Spouse name: Not on file  . Number of children: 2  . Years of education: Not on file  . Highest education level: Not on file  Occupational History  . Not on file  Tobacco Use  . Smoking status: Never Smoker  .  Smokeless tobacco: Never Used  Vaping Use  . Vaping Use: Never used  Substance and Sexual Activity  . Alcohol use: Yes    Alcohol/week: 0.0 standard drinks    Comment: 10/31/2016 "wine a couple times/month"  . Drug use: No  . Sexual activity: Yes    Partners: Female  Other Topics Concern  . Not on file  Social History Narrative  . Not on file   Social Determinants of Health   Financial Resource Strain: Low Risk   . Difficulty of Paying Living Expenses: Not hard at all  Food Insecurity: No Food Insecurity  . Worried About Programme researcher, broadcasting/film/videounning Out of Food in the Last Year: Never true  . Ran Out of Food in the Last  Year: Never true  Transportation Needs: No Transportation Needs  . Lack of Transportation (Medical): No  . Lack of Transportation (Non-Medical): No  Physical Activity: Sufficiently Active  . Days of Exercise per Week: 4 days  . Minutes of Exercise per Session: 60 min  Stress: No Stress Concern Present  . Feeling of Stress : Not at all  Social Connections: Socially Integrated  . Frequency of Communication with Friends and Family: More than three times a week  . Frequency of Social Gatherings with Friends and Family: More than three times a week  . Attends Religious Services: More than 4 times per year  . Active Member of Clubs or Organizations: Yes  . Attends BankerClub or Organization Meetings: More than 4 times per year  . Marital Status: Married    Tobacco Counseling Counseling given: Not Answered   Clinical Intake:  Pre-visit preparation completed: Yes  Pain : No/denies pain     BMI - recorded: 38.68 Nutritional Status: BMI > 30  Obese Nutritional Risks: None Diabetes: No  How often do you need to have someone help you when you read instructions, pamphlets, or other written materials from your doctor or pharmacy?: 1 - Never    Interpreter Needed?: No  Information entered by :: Reather LittlerKasey Tomorrow Dehaas LPN   Activities of Daily Living In your present state of health, do you have any difficulty performing the following activities: 07/12/2020 12/24/2019  Hearing? N N  Comment declines hearing aids -  Vision? N N  Difficulty concentrating or making decisions? N N  Walking or climbing stairs? N N  Dressing or bathing? N N  Doing errands, shopping? N N  Preparing Food and eating ? N -  Using the Toilet? N -  In the past six months, have you accidently leaked urine? N -  Do you have problems with loss of bowel control? N -  Managing your Medications? N -  Managing your Finances? N -  Housekeeping or managing your Housekeeping? N -  Some recent data might be hidden    Patient  Care Team: Danelle Berryapia, Leisa, PA-C as PCP - General (Family Medicine) Vanna ScotlandBrandon, Ashley, MD as Consulting Physician (Urology)  Indicate any recent Medical Services you may have received from other than [facility] providers in the past year (date may be approximate).     Assessment:   This is a routine wellness examination for Blease.  Hearing/Vision screen  Hearing Screening   125Hz  250Hz  500Hz  1000Hz  2000Hz  3000Hz  4000Hz  6000Hz  8000Hz   Right ear:           Left ear:           Comments: Pt denies hearing difficulty  Vision Screening Comments: Patient past due for eye exam. Not established with a provider  at this time.   Dietary issues and exercise activities discussed: Current Exercise Habits: Home exercise routine, Type of exercise: Other - see comments;treadmill;strength training/weights (golf), Time (Minutes): 60, Frequency (Times/Week): 4, Weekly Exercise (Minutes/Week): 240, Intensity: Moderate, Exercise limited by: None identified  Goals    . Weight (lb) < 270 lb (122.5 kg)     Pt would like to lose weight and eat healthier over the next year in order to keep cholesterol down and stay off statin drugs.       Depression Screen PHQ 2/9 Scores 07/12/2020 12/24/2019 07/09/2019 05/11/2019 10/29/2018 07/30/2018 07/08/2018  PHQ - 2 Score 0 0 0 0 0 0 0  PHQ- 9 Score - 0 - 0 0 0 -    Fall Risk Fall Risk  07/12/2020 12/24/2019 07/09/2019 05/11/2019 10/29/2018  Falls in the past year? 0 0 0 0 0  Number falls in past yr: 0 0 0 0 0  Injury with Fall? 0 0 0 0 0  Risk for fall due to : No Fall Risks - - - -  Follow up Falls prevention discussed - Falls prevention discussed - -    Any stairs in or around the home? Yes  If so, are there any without handrails? No  - 1 step outside Home free of loose throw rugs in walkways, pet beds, electrical cords, etc? Yes  Adequate lighting in your home to reduce risk of falls? Yes   ASSISTIVE DEVICES UTILIZED TO PREVENT FALLS:  Life alert? No  Use of a cane,  walker or w/c? No  Grab bars in the bathroom? No  Shower chair or bench in shower? No  Elevated toilet seat or a handicapped toilet? Yes   TIMED UP AND GO:  Was the test performed? Yes .  Length of time to ambulate 10 feet: 4 sec.   Gait steady and fast without use of assistive device  Cognitive Function:     6CIT Screen 07/12/2020  What Year? 0 points  What month? 0 points  What time? 0 points  Count back from 20 0 points  Months in reverse 0 points  Repeat phrase 4 points  Total Score 4    Immunizations Immunization History  Administered Date(s) Administered  . Influenza, High Dose Seasonal PF 07/08/2018  . Influenza, Seasonal, Injecte, Preservative Fre 04/10/2012, 06/24/2013  . Influenza,inj,Quad PF,6+ Mos 08/06/2016, 06/06/2017  . PFIZER SARS-COV-2 Vaccination 10/12/2019, 11/05/2019, 06/30/2020  . Tdap 02/08/2011  . Zoster 03/19/2013    TDAP status: Up to date   Flu Vaccine status: Declined, Education has been provided regarding the importance of this vaccine but patient still declined. Advised may receive this vaccine at local pharmacy or Health Dept. Aware to provide a copy of the vaccination record if obtained from local pharmacy or Health Dept. Verbalized acceptance and understanding.   Pneumococcal vaccine status: Declined,  Education has been provided regarding the importance of this vaccine but patient still declined. Advised may receive this vaccine at local pharmacy or Health Dept. Aware to provide a copy of the vaccination record if obtained from local pharmacy or Health Dept. Verbalized acceptance and understanding.    Covid-19 vaccine status: Completed vaccines  Qualifies for Shingles Vaccine? Yes   Zostavax completed Yes   Shingrix Completed?: No.    Education has been provided regarding the importance of this vaccine. Patient has been advised to call insurance company to determine out of pocket expense if they have not yet received this vaccine. Advised  may also receive  vaccine at local pharmacy or Health Dept. Verbalized acceptance and understanding.  Screening Tests Health Maintenance  Topic Date Due  . INFLUENZA VACCINE  12/08/2020 (Originally 04/10/2020)  . PNA vac Low Risk Adult (1 of 2 - PCV13) 12/23/2020 (Originally 12/21/2017)  . TETANUS/TDAP  02/07/2021  . COLONOSCOPY  04/24/2023  . COVID-19 Vaccine  Completed  . Hepatitis C Screening  Completed    Health Maintenance  There are no preventive care reminders to display for this patient.  Colorectal cancer screening: Completed 04/23/13. Repeat every 10 years  Lung Cancer Screening: (Low Dose CT Chest recommended if Age 9-80 years, 30 pack-year currently smoking OR have quit w/in 15years.) does not qualify.   Additional Screening:  Hepatitis C Screening: does qualify; Completed 03/19/13  Vision Screening: Recommended annual ophthalmology exams for early detection of glaucoma and other disorders of the eye. Is the patient up to date with their annual eye exam?  No  Who is the provider or what is the name of the office in which the patient attends annual eye exams? Not established If pt is not established with a provider, would they like to be referred to a provider to establish care? No .   Dental Screening: Recommended annual dental exams for proper oral hygiene  Community Resource Referral / Chronic Care Management: CRR required this visit?  No   CCM required this visit?  No      Plan:     I have personally reviewed and noted the following in the patient's chart:   . Medical and social history . Use of alcohol, tobacco or illicit drugs  . Current medications and supplements . Functional ability and status . Nutritional status . Physical activity . Advanced directives . List of other physicians . Hospitalizations, surgeries, and ER visits in previous 12 months . Vitals . Screenings to include cognitive, depression, and falls . Referrals and appointments  In  addition, I have reviewed and discussed with patient certain preventive protocols, quality metrics, and best practice recommendations. A written personalized care plan for preventive services as well as general preventive health recommendations were provided to patient.     Reather Littler, LPN   [id number]   Nurse Notes: pt advised to schedule 6 month f/u appt with Leisa, last seen 12/24/19

## 2020-08-16 ENCOUNTER — Other Ambulatory Visit: Payer: Self-pay | Admitting: Emergency Medicine

## 2020-08-16 MED ORDER — AMLODIPINE BESYLATE 5 MG PO TABS: 7.5000 mg | ORAL_TABLET | Freq: Every day | ORAL | 1 refills | Status: DC

## 2020-08-20 ENCOUNTER — Encounter: Payer: Self-pay | Admitting: Family Medicine

## 2020-08-29 ENCOUNTER — Other Ambulatory Visit: Payer: Self-pay | Admitting: Emergency Medicine

## 2020-08-29 DIAGNOSIS — I10 Essential (primary) hypertension: Secondary | ICD-10-CM

## 2020-08-29 NOTE — Telephone Encounter (Signed)
Insurance has now changed. Cannot get refills at [facility] anymore. Need refills sent to [facility] [employer]. Now has [employer]

## 2020-08-30 MED ORDER — TELMISARTAN-HCTZ 80-25 MG PO TABS: 1.0000 | ORAL_TABLET | Freq: Every day | ORAL | 0 refills | Status: DC

## 2020-08-30 MED ORDER — AMLODIPINE BESYLATE 5 MG PO TABS: 7.5000 mg | ORAL_TABLET | Freq: Every day | ORAL | 0 refills | Status: DC

## 2020-10-07 ENCOUNTER — Other Ambulatory Visit: Payer: Self-pay

## 2020-10-07 MED ORDER — AMLODIPINE BESYLATE 5 MG PO TABS: 7.5000 mg | ORAL_TABLET | Freq: Every day | ORAL | 0 refills | Status: DC

## 2020-10-25 DIAGNOSIS — L820 Inflamed seborrheic keratosis: Secondary | ICD-10-CM | POA: Diagnosis not present

## 2020-10-25 DIAGNOSIS — L538 Other specified erythematous conditions: Secondary | ICD-10-CM | POA: Diagnosis not present

## 2020-10-25 DIAGNOSIS — L918 Other hypertrophic disorders of the skin: Secondary | ICD-10-CM | POA: Diagnosis not present

## 2020-10-31 ENCOUNTER — Ambulatory Visit (INDEPENDENT_AMBULATORY_CARE_PROVIDER_SITE_OTHER): Payer: 59 | Admitting: Internal Medicine

## 2020-10-31 VITALS — BP 149/85 | HR 71 | Temp 99.3°F | Resp 16 | Ht 74.0 in | Wt 291.0 lb

## 2020-10-31 DIAGNOSIS — Z9989 Dependence on other enabling machines and devices: Secondary | ICD-10-CM | POA: Diagnosis not present

## 2020-10-31 DIAGNOSIS — I10 Essential (primary) hypertension: Secondary | ICD-10-CM

## 2020-10-31 DIAGNOSIS — G4733 Obstructive sleep apnea (adult) (pediatric): Secondary | ICD-10-CM | POA: Insufficient documentation

## 2020-10-31 DIAGNOSIS — Z7189 Other specified counseling: Secondary | ICD-10-CM

## 2020-10-31 NOTE — Progress Notes (Signed)
[facility] [address] [place], [place] [address]  Pulmonary Sleep Medicine   Office Visit Note  Patient Name: Warren [name] [place] DOB: 13-Jan-1951 MRN [id number]    Chief Complaint: Obstructive Sleep Apnea visit  Brief History:  Shayan is seen today for follow up The patient has a 6 year history of sleep apnea. Patient is using PAP nightly.  The patient feels more rested after sleeping with PAP.  The patient reports benefiting from PAP use. Reported sleepiness is  improved and the Epworth Sleepiness Score is 5 out of 24. The patient rarely take naps. The patient complains of the following: no complaints  The compliance download shows excellent compliance with an average use time of 0.3 hours. The AHI is 0.3  The patient does not complain of limb movements disrupting sleep.  ROS  General: (-) fever, (-) chills, (-) night sweat Nose and Sinuses: (-) nasal stuffiness or itchiness, (-) postnasal drip, (-) nosebleeds, (-) sinus trouble. Mouth and Throat: (-) sore throat, (-) hoarseness. Neck: (-) swollen glands, (-) enlarged thyroid, (-) neck pain. Respiratory: - cough, - shortness of breath, - wheezing. Neurologic: - numbness, - tingling. Psychiatric: - anxiety, - depression   Current Medication: Outpatient Encounter Medications as of 10/31/2020  Medication Sig  . amLODipine (NORVASC) 5 MG tablet Take 1.5 tablets (7.5 mg total) by mouth daily.  Marland Kitchen aspirin EC 81 MG tablet Take 1 tablet (81 mg total) by mouth daily.  . Omega-3 Fatty Acids (FISH OIL) 1200 MG CAPS Take 1,200 mg by mouth.  . rosuvastatin (CRESTOR) 10 MG tablet Take 1 tablet (10 mg total) by mouth daily. (Patient not taking: Reported on 07/12/2020)  . tadalafil (CIALIS) 5 MG tablet TAKE ONE TABLET BY MOUTH DAILY  . telmisartan-hydrochlorothiazide (MICARDIS HCT) 80-25 MG tablet Take 1 tablet by mouth daily. for blood pressure   No facility-administered encounter medications on file as of 10/31/2020.    Surgical  History: Past Surgical History:  Procedure Laterality Date  . HAMMER TOE SURGERY Bilateral 11/2015 - 01/2016   "right-left"  . JOINT REPLACEMENT    . SOFT TISSUE BIOPSY Right ~ 1986   "@ [facility]"  . TOTAL KNEE ARTHROPLASTY Right 08/22/2016   Procedure: TOTAL KNEE ARTHROPLASTY;  Surgeon: Loreta Ave, MD;  [facility];  Service: Orthopedics;  Laterality: Right;  . TOTAL KNEE ARTHROPLASTY Left 10/31/2016  . TOTAL KNEE ARTHROPLASTY Left 10/31/2016   Procedure: TOTAL KNEE ARTHROPLASTY;  Surgeon: Loreta Ave, MD;  Location: [facility] OR;  Service: Orthopedics;  Laterality: Left;    Medical History: Past Medical History:  Diagnosis Date  . Elevated PSA 06/07/2017   6.2 -- refer to urologist Sept 2018  . Erectile dysfunction 02/27/2016  . Hypercholesterolemia without hypertriglyceridemia 10/08/2007  . Hypertension   . Malignant hyperthermia 1986   "told I had maybe a mild case of imalignant hyperthermia at [facility]"  . Obesity 02/24/2015  . OSA on CPAP 02/24/2015   CPAP q night   . Osteoarthritis    "feet and knees"  (10/31/2016)  . PIN (prostatic intraepithelial neoplasia) 09/05/2017   Prostate biopsy Nov 2018    Family History: Non contributory to the present illness  Social History: Social History   Socioeconomic History  . Marital status: Married    Spouse name: Not on file  . Number of children: 2  . Years of education: Not on file  . Highest education level: Not on file  Occupational History  . Not on file  Tobacco Use  .  Smoking status: Never Smoker  . Smokeless tobacco: Never Used  Vaping Use  . Vaping Use: Never used  Substance and Sexual Activity  . Alcohol use: Yes    Alcohol/week: 0.0 standard drinks    Comment: 10/31/2016 "wine a couple times/month"  . Drug use: No  . Sexual activity: Yes    Partners: Female  Other Topics Concern  . Not on file  Social History Narrative  . Not on file   Social Determinants of Health   Financial Resource Strain: Low Risk    . Difficulty of Paying Living Expenses: Not hard at all  Food Insecurity: No Food Insecurity  . Worried About Programme researcher, broadcasting/film/video in the Last Year: Never true  . Ran Out of Food in the Last Year: Never true  Transportation Needs: No Transportation Needs  . Lack of Transportation (Medical): No  . Lack of Transportation (Non-Medical): No  Physical Activity: Sufficiently Active  . Days of Exercise per Week: 4 days  . Minutes of Exercise per Session: 60 min  Stress: No Stress Concern Present  . Feeling of Stress : Not at all  Social Connections: Socially Integrated  . Frequency of Communication with Friends and Family: More than three times a week  . Frequency of Social Gatherings with Friends and Family: More than three times a week  . Attends Religious Services: More than 4 times per year  . Active Member of Clubs or Organizations: Yes  . Attends Banker Meetings: More than 4 times per year  . Marital Status: Married  Catering manager Violence: Not At Risk  . Fear of Current or Ex-Partner: No  . Emotionally Abused: No  . Physically Abused: No  . Sexually Abused: No    Vital Signs: Blood pressure (!) 149/85, pulse 71, temperature 99.3 F (37.4 C), temperature source Temporal, resp. rate 16, height 6\' 2"  (1.88 m), weight 291 lb (132 kg), SpO2 96 %.  Examination: General Appearance: The patient is well-developed, well-nourished, and in no distress. Neck Circumference: 44 Skin: Gross inspection of skin unremarkable. Head: normocephalic, no gross deformities. Eyes: no gross deformities noted. ENT: ears appear grossly normal Neurologic: Alert and oriented. No involuntary movements.    EPWORTH SLEEPINESS SCALE:  Scale:  (0)= no chance of dozing; (1)= slight chance of dozing; (2)= moderate chance of dozing; (3)= high chance of dozing  Chance  Situtation    Sitting and reading: 1    Watching TV: 1    Sitting Inactive in public: 0    As a passenger in car: 1       Lying down to rest: 1    Sitting and talking: 0    Sitting quielty after lunch: 1    In a car, stopped in traffic: 0   TOTAL SCORE:   5 out of 24    SLEEP STUDIES:  Split 06/20/14 - AHI 17.4,  Low SpO2 87%,  CPAP @ 8cm  CPAP COMPLIANCE DATA:  Date Range: 10/28/19 - 10/26/20  Average Daily Use: 7:01 hours  Median Use: 6:54  Compliance for > 4 Hours: 98%  AHI: 0.3 respiratory events per hour  Days Used: 365/356  Mask Leak: 35.2  95th Percentile Pressure: 8.0 cmh2o         LABS: No results found for this or any previous visit (from the past 2160 hour(s)).  Radiology: MR Brain Wo Contrast  Result Date: 08/12/2017 CLINICAL DATA:  Struck on head with garage door 1 week ago. Mild headache,  gait imbalance. Blurry vision has resolved. EXAM: MRI HEAD WITHOUT CONTRAST TECHNIQUE: Multiplanar, multiecho pulse sequences of the brain and surrounding structures were obtained without intravenous contrast. COMPARISON:  None. FINDINGS: BRAIN: No reduced diffusion to suggest acute ischemia. No susceptibility artifact to suggest hemorrhage. The ventricles and sulci are normal for patient's age. No suspicious parenchymal signal, mass or mass effect. No abnormal extra-axial fluid collections. Small RIGHT parafalcine dural calcification. VASCULAR: Normal major intracranial vascular flow voids present at skull base. SKULL AND UPPER CERVICAL SPINE: No abnormal sellar expansion. No suspicious calvarial bone marrow signal. Craniocervical junction maintained. SINUSES/ORBITS: The mastoid air-cells and included paranasal sinuses are well-aerated. The included ocular globes and orbital contents are non-suspicious. OTHER: None. IMPRESSION: Normal noncontrast MRI of the head. Electronically Signed   By: Awilda Metro M.D.   On: 08/12/2017 18:29    No results found.  No results found.    Assessment and Plan: Patient Active Problem List   Diagnosis Date Noted  . Morbid obesity (HCC)  07/30/2018  . PIN (prostatic intraepithelial neoplasia) 09/05/2017  . Elevated PSA 06/07/2017  . Abnormal EKG 08/06/2016  . Erectile dysfunction 02/27/2016  . Obstructive sleep apnea of adult 02/24/2015  . Flat foot 07/05/2008  . Allergic rhinitis 01/12/2008  . Hyperlipidemia 10/08/2007  . Benign essential HTN 08/13/2007   1. OSA on CPAP The patient does tolerate PAP and reports significant benefit from PAP use. The patient was reminded how to clean the CPAP and advised to stop using the SoClean. The patient is active during the day and is watching his diet. The compliance is excellent. The apnea is well controlled.He is using the phone app for his CPAP   1. OSA- continue excellent compliance. Machine has reached end of usable life and should be replaced. Will follow up with pt after replacement.  The patient does tolerate PAP and reports significant benefit from PAP use. The patient was reminded how to clean the CPAP and advised to stop using the SoClean. The patient is active during the day and is watching his diet. The compliance is excellent. The apnea is well controlled.He is using the phone app for his CPAP. OSA- continue excellent compliance. Machine has reached end of usable life and should be replaced. Will follow up with pt after replacement.    2. CPAP use counseling CPAP Counseling: had a lengthy discussion with the patient regarding the importance of PAP therapy in management of the sleep apnea. Patient appears to understand the risk factor reduction and also understands the risks associated with untreated sleep apnea. Patient will try to make a good faith effort to remain compliant with therapy. Also instructed the patient on proper cleaning of the device including the water must be changed daily if possible and use of distilled water is preferred. Patient understands that the machine should be regularly cleaned with appropriate recommended cleaning solutions that do not damage the  PAP machine for example given white vinegar and water rinses. Other methods such as ozone treatment may not be as good as these simple methods to achieve cleaning.  3. Benign essential HTN Hypertension Counseling:   The following hypertensive lifestyle modification were recommended and discussed:  1. Limiting alcohol intake to less than 1 oz/day of ethanol:(24 oz of beer or 8 oz of wine or 2 oz of 100-proof whiskey). 2. Take baby ASA 81 mg daily. 3. Importance of regular aerobic exercise and losing weight. 4. Reduce dietary saturated fat and cholesterol intake for overall cardiovascular health. 5.  Maintaining adequate dietary potassium, calcium, and magnesium intake. 6. Regular monitoring of the blood pressure. 7. Reduce sodium intake to less than 100 mmol/day (less than 2.3 gm of sodium or less than 6 gm of sodium choride)   4. Morbid obesity (HCC) Obesity Counseling: Had a lengthy discussion regarding patients BMI and weight issues. Patient was instructed on portion control as well as increased activity. Also discussed caloric restrictions with trying to maintain intake less than 2000 Kcal. Discussions were made in accordance with the 5As of weight management. Simple actions such as not eating late and if able to, taking a walk is suggested.    General Counseling: I have discussed the findings of the evaluation and examination with Merlyn AlbertFred.  I have also discussed any further diagnostic evaluation thatmay be needed or ordered today. Albi verbalizes understanding of the findings of todays visit. We also reviewed his medications today and discussed drug interactions and side effects including but not limited excessive drowsiness and altered mental states. We also discussed that there is always a risk not just to him but also people around him. he has been encouraged to call the office with any questions or concerns that should arise related to todays visit.  No orders of the defined types were placed  in this encounter.       I have personally obtained a history, examined the patient, evaluated laboratory and imaging results, formulated the assessment and plan and placed orders.  This patient was seen today by Emmaline KluverSarah Terrell, PA-C in collaboration with Dr. Freda MunroSaadat Jozette Castrellon.   Valentino HueKathe G. Sol BlazingHenke, PhD, FAASM  Diplomate, American Board of Sleep Medicine    Yevonne PaxSaadat A Kyre Jeffries, MD [facility] Diplomate ABMS Pulmonary and Critical Care Medicine Sleep medicine

## 2020-10-31 NOTE — Patient Instructions (Signed)

## 2020-11-18 DIAGNOSIS — G4733 Obstructive sleep apnea (adult) (pediatric): Secondary | ICD-10-CM | POA: Diagnosis not present

## 2020-11-22 ENCOUNTER — Other Ambulatory Visit: Payer: Self-pay | Admitting: Family Medicine

## 2020-11-22 DIAGNOSIS — I10 Essential (primary) hypertension: Secondary | ICD-10-CM

## 2020-11-22 NOTE — Telephone Encounter (Signed)
Has an appt in may 

## 2020-12-19 DIAGNOSIS — G4733 Obstructive sleep apnea (adult) (pediatric): Secondary | ICD-10-CM | POA: Diagnosis not present

## 2021-01-09 ENCOUNTER — Ambulatory Visit: Payer: Medicare Other | Admitting: Family Medicine

## 2021-01-16 DIAGNOSIS — H5203 Hypermetropia, bilateral: Secondary | ICD-10-CM | POA: Diagnosis not present

## 2021-01-18 DIAGNOSIS — G4733 Obstructive sleep apnea (adult) (pediatric): Secondary | ICD-10-CM | POA: Diagnosis not present

## 2021-01-23 ENCOUNTER — Ambulatory Visit: Payer: 59

## 2021-01-23 ENCOUNTER — Ambulatory Visit (INDEPENDENT_AMBULATORY_CARE_PROVIDER_SITE_OTHER): Payer: 59 | Admitting: Internal Medicine

## 2021-01-23 VITALS — BP 140/75 | HR 66 | Resp 16 | Ht 74.0 in | Wt 287.0 lb

## 2021-01-23 DIAGNOSIS — G4733 Obstructive sleep apnea (adult) (pediatric): Secondary | ICD-10-CM | POA: Diagnosis not present

## 2021-01-23 DIAGNOSIS — E669 Obesity, unspecified: Secondary | ICD-10-CM | POA: Diagnosis not present

## 2021-01-23 DIAGNOSIS — Z7189 Other specified counseling: Secondary | ICD-10-CM

## 2021-01-23 DIAGNOSIS — Z9989 Dependence on other enabling machines and devices: Secondary | ICD-10-CM | POA: Diagnosis not present

## 2021-01-23 DIAGNOSIS — I10 Essential (primary) hypertension: Secondary | ICD-10-CM | POA: Diagnosis not present

## 2021-01-23 NOTE — Progress Notes (Signed)
[facility] [address] [place], [place] [address]  Pulmonary Sleep Medicine   Office Visit Note  Patient Name: Warren [name] DOB: March 13, 1951 MRN [id number]    Chief Complaint: Obstructive Sleep Apnea visit  Brief History:  Warren [name] is seen today for follow up after set up of replacement machine. The patient has a 6 year history of sleep apnea. Patient 7:19 hrs  using PAP nightly.  The patient feels  really goods after sleeping with PAP.  The patient reports good benefits from PAP use. Reported sleepiness is  3 and the Epworth Sleepiness Score is 3 out of 24. The patient does not take naps. The patient complains of no problems or concerns and cleans all supplies regularly.  The compliance download shows   Excellent compliance with an average use time of 7:19 hours. The AHI is 0.3  The patient does not complain  of limb movements disrupting sleep.  ROS  General: (-) fever, (-) chills, (-) night sweat Nose and Sinuses: (-) nasal stuffiness or itchiness, (-) postnasal drip, (-) nosebleeds, (-) sinus trouble. Mouth and Throat: (-) sore throat, (-) hoarseness. Neck: (-) swollen glands, (-) enlarged thyroid, (-) neck pain. Respiratory: - cough, - shortness of breath, - wheezing. Neurologic: - numbness, - tingling. Psychiatric: - anxiety, - depression   Current Medication: Outpatient Encounter Medications as of 01/23/2021  Medication Sig  . amLODipine (NORVASC) 5 MG tablet TAKE 1.5 TABLETS BY MOUTH DAILY.  Marland Kitchen aspirin EC 81 MG tablet Take 1 tablet (81 mg total) by mouth daily.  . Omega-3 Fatty Acids (FISH OIL) 1200 MG CAPS Take 1,200 mg by mouth.  . rosuvastatin (CRESTOR) 10 MG tablet Take 1 tablet (10 mg total) by mouth daily. (Patient not taking: Reported on 07/12/2020)  . tadalafil (CIALIS) 5 MG tablet TAKE ONE TABLET BY MOUTH DAILY  . telmisartan-hydrochlorothiazide (MICARDIS HCT) 80-25 MG tablet TAKE 1 TABLET BY MOUTH DAILY FOR BLOOD PRESSURE   No facility-administered  encounter medications on file as of 01/23/2021.    Surgical History: Past Surgical History:  Procedure Laterality Date  . HAMMER TOE SURGERY Bilateral 11/2015 - 01/2016   "right-left"  . JOINT REPLACEMENT    . SOFT TISSUE BIOPSY Right ~ 1986   "@ [facility]"  . TOTAL KNEE ARTHROPLASTY Right 08/22/2016   Procedure: TOTAL KNEE ARTHROPLASTY;  Surgeon: Loreta Ave, MD;  Location: [facility] OR;  Service: Orthopedics;  Laterality: Right;  . TOTAL KNEE ARTHROPLASTY Left 10/31/2016  . TOTAL KNEE ARTHROPLASTY Left 10/31/2016   Procedure: TOTAL KNEE ARTHROPLASTY;  Surgeon: Loreta Ave, MD;  Location: [facility] OR;  Service: Orthopedics;  Laterality: Left;    Medical History: Past Medical History:  Diagnosis Date  . Elevated PSA 06/07/2017   6.2 -- refer to urologist Sept 2018  . Erectile dysfunction 02/27/2016  . Hypercholesterolemia without hypertriglyceridemia 10/08/2007  . Hypertension   . Malignant hyperthermia 1986   "told I had maybe a mild case of imalignant hyperthermia at [facility]"  . Obesity 02/24/2015  . OSA on CPAP 02/24/2015   CPAP q night   . Osteoarthritis    "feet and knees"  (10/31/2016)  . PIN (prostatic intraepithelial neoplasia) 09/05/2017   Prostate biopsy Nov 2018    Family History: Non contributory to the present illness  Social History: Social History   Socioeconomic History  . Marital status: Married    Spouse name: Not on file  . Number of children: 2  . Years of education: Not on file  . Highest  education level: Not on file  Occupational History  . Not on file  Tobacco Use  . Smoking status: Never Smoker  . Smokeless tobacco: Never Used  Vaping Use  . Vaping Use: Never used  Substance and Sexual Activity  . Alcohol use: Yes    Alcohol/week: 0.0 standard drinks    Comment: 10/31/2016 "wine a couple times/month"  . Drug use: No  . Sexual activity: Yes    Partners: Female  Other Topics Concern  . Not on file  Social History Narrative  . Not on file   Social  Determinants of Health   Financial Resource Strain: Low Risk   . Difficulty of Paying Living Expenses: Not hard at all  Food Insecurity: No Food Insecurity  . Worried About Programme researcher, broadcasting/film/video in the Last Year: Never true  . Ran Out of Food in the Last Year: Never true  Transportation Needs: No Transportation Needs  . Lack of Transportation (Medical): No  . Lack of Transportation (Non-Medical): No  Physical Activity: Sufficiently Active  . Days of Exercise per Week: 4 days  . Minutes of Exercise per Session: 60 min  Stress: No Stress Concern Present  . Feeling of Stress : Not at all  Social Connections: Socially Integrated  . Frequency of Communication with Friends and Family: More than three times a week  . Frequency of Social Gatherings with Friends and Family: More than three times a week  . Attends Religious Services: More than 4 times per year  . Active Member of Clubs or Organizations: Yes  . Attends Banker Meetings: More than 4 times per year  . Marital Status: Married  Catering manager Violence: Not At Risk  . Fear of Current or Ex-Partner: No  . Emotionally Abused: No  . Physically Abused: No  . Sexually Abused: No    Vital Signs: Blood pressure 140/75, pulse 66, resp. rate 16, height 6\' 2"  (1.88 m), weight 287 lb (130.2 kg), SpO2 96 %.  Examination: General Appearance: The patient is well-developed, well-nourished, and in no distress. Neck Circumference: 44 Skin: Gross inspection of skin unremarkable. Head: normocephalic, no gross deformities. Eyes: no gross deformities noted. ENT: ears appear grossly normal Neurologic: Alert and oriented. No involuntary movements.    EPWORTH SLEEPINESS SCALE:  Scale:  (0)= no chance of dozing; (1)= slight chance of dozing; (2)= moderate chance of dozing; (3)= high chance of dozing  Chance  Situtation    Sitting and reading: 1    Watching TV: 1    Sitting Inactive in public: 0    As a passenger in car: 0       Lying down to rest: 1    Sitting and talking: 0    Sitting quielty after lunch: 0    In a car, stopped in traffic: 0   TOTAL SCORE:   3 out of 24    SLEEP STUDIES:  1. Split 06/20/14 - AHI 17.4,  Low SpO2 87%,  CPAP @ 8cm   CPAP COMPLIANCE DATA:  Date Range: 12/24/20 - 01/22/21  Average Daily Use: 7:19 hours  Median Use: 7:08 hours  Compliance for > 4 Hours: 100%  AHI: 0.3 respiratory events per hour  Days Used: 30/30  Mask Leak: 30.1  95th Percentile Pressure: 8 cmH2O         LABS: No results found for this or any previous visit (from the past 2160 hour(s)).  Radiology: MR Brain Wo Contrast  Result Date: 08/12/2017 CLINICAL DATA:  Struck on head with garage door 1 week ago. Mild headache, gait imbalance. Blurry vision has resolved. EXAM: MRI HEAD WITHOUT CONTRAST TECHNIQUE: Multiplanar, multiecho pulse sequences of the brain and surrounding structures were obtained without intravenous contrast. COMPARISON:  None. FINDINGS: BRAIN: No reduced diffusion to suggest acute ischemia. No susceptibility artifact to suggest hemorrhage. The ventricles and sulci are normal for patient's age. No suspicious parenchymal signal, mass or mass effect. No abnormal extra-axial fluid collections. Small RIGHT parafalcine dural calcification. VASCULAR: Normal major intracranial vascular flow voids present at skull base. SKULL AND UPPER CERVICAL SPINE: No abnormal sellar expansion. No suspicious calvarial bone marrow signal. Craniocervical junction maintained. SINUSES/ORBITS: The mastoid air-cells and included paranasal sinuses are well-aerated. The included ocular globes and orbital contents are non-suspicious. OTHER: None. IMPRESSION: Normal noncontrast MRI of the head. Electronically Signed   By: Awilda Metro M.D.   On: 08/12/2017 18:29    No results found.  No results found.    Assessment and Plan: Patient Active Problem List   Diagnosis Date Noted  . Obesity (BMI  30-39.9) 01/23/2021  . OSA on CPAP 10/31/2020  . Morbid obesity (HCC) 07/30/2018  . PIN (prostatic intraepithelial neoplasia) 09/05/2017  . CPAP use counseling 07/03/2017  . Elevated PSA 06/07/2017  . Abnormal EKG 08/06/2016  . Erectile dysfunction 02/27/2016  . Obstructive sleep apnea of adult 02/24/2015  . Flat foot 07/05/2008  . Allergic rhinitis 01/12/2008  . Hyperlipidemia 10/08/2007  . Benign essential HTN 08/13/2007   1. OSA on CPAP The patient does tolerate PAP and reports definite benefit from PAP use. The patient was reminded how to clean equipment  and advised to replace supplies routinely. . The patient was also counselled on weight loss. The compliance is excellent. . The AHI is 0.3. OSA- continue excellent compliance with CPAP at 8 cm h20. F/u in one year.     2. CPAP use counseling CPAP Counseling: had a lengthy discussion with the patient regarding the importance of PAP therapy in management of the sleep apnea. Patient appears to understand the risk factor reduction and also understands the risks associated with untreated sleep apnea. Patient will try to make a good faith effort to remain compliant with therapy. Also instructed the patient on proper cleaning of the device including the water must be changed daily if possible and use of distilled water is preferred. Patient understands that the machine should be regularly cleaned with appropriate recommended cleaning solutions that do not damage the PAP machine for example given white vinegar and water rinses. Other methods such as ozone treatment may not be as good as these simple methods to achieve cleaning.  3. Benign essential HTN Hypertension Counseling:   The following hypertensive lifestyle modification were recommended and discussed:  1. Limiting alcohol intake to less than 1 oz/day of ethanol:(24 oz of beer or 8 oz of wine or 2 oz of 100-proof whiskey). 2. Take baby ASA 81 mg daily. 3. Importance of regular  aerobic exercise and losing weight. 4. Reduce dietary saturated fat and cholesterol intake for overall cardiovascular health. 5. Maintaining adequate dietary potassium, calcium, and magnesium intake. 6. Regular monitoring of the blood pressure. 7. Reduce sodium intake to less than 100 mmol/day (less than 2.3 gm of sodium or less than 6 gm of sodium choride)   4. Obesity (BMI 30-39.9) Obesity Counseling: Had a lengthy discussion regarding patients BMI and weight issues. Patient was instructed on portion control as well as increased activity. Also discussed caloric restrictions with trying  to maintain intake less than 2000 Kcal. Discussions were made in accordance with the 5As of weight management. Simple actions such as not eating late and if able to, taking a walk is suggested.   General Counseling: I have discussed the findings of the evaluation and examination with Warren [name].  I have also discussed any further diagnostic evaluation thatmay be needed or ordered today. Warren [name] verbalizes understanding of the findings of todays visit. We also reviewed his medications today and discussed drug interactions and side effects including but not limited excessive drowsiness and altered mental states. We also discussed that there is always a risk not just to him but also people around him. he has been encouraged to call the office with any questions or concerns that should arise related to todays visit.  No orders of the defined types were placed in this encounter.       I have personally obtained a history, examined the patient, evaluated laboratory and imaging results, formulated the assessment and plan and placed orders.   This patient was seen today by Emmaline KluverSarah Terrell, PA-C in collaboration with Dr. Freda MunroSaadat Eyla Tallon.   Yevonne PaxSaadat A Bilaal Leib, MD [facility] Diplomate ABMS Pulmonary and Critical Care Medicine Sleep medicine

## 2021-01-23 NOTE — Patient Instructions (Signed)

## 2021-01-26 ENCOUNTER — Ambulatory Visit: Payer: Medicare Other | Admitting: Family Medicine

## 2021-01-31 ENCOUNTER — Ambulatory Visit (INDEPENDENT_AMBULATORY_CARE_PROVIDER_SITE_OTHER): Payer: 59 | Admitting: Unknown Physician Specialty

## 2021-01-31 ENCOUNTER — Other Ambulatory Visit: Payer: Self-pay

## 2021-01-31 ENCOUNTER — Encounter: Payer: Self-pay | Admitting: Unknown Physician Specialty

## 2021-01-31 VITALS — BP 132/78 | HR 68 | Temp 97.8°F | Resp 28 | Ht 74.0 in | Wt 287.1 lb

## 2021-01-31 DIAGNOSIS — N5203 Combined arterial insufficiency and corporo-venous occlusive erectile dysfunction: Secondary | ICD-10-CM | POA: Diagnosis not present

## 2021-01-31 DIAGNOSIS — I10 Essential (primary) hypertension: Secondary | ICD-10-CM

## 2021-01-31 DIAGNOSIS — R972 Elevated prostate specific antigen [PSA]: Secondary | ICD-10-CM | POA: Diagnosis not present

## 2021-01-31 DIAGNOSIS — E782 Mixed hyperlipidemia: Secondary | ICD-10-CM

## 2021-01-31 NOTE — Assessment & Plan Note (Signed)
Stable, continue present medications.   

## 2021-01-31 NOTE — Progress Notes (Signed)
BP 132/78   Pulse 68   Temp 97.8 F (36.6 C) (Oral)   Resp (!) 28   Ht 6\' 2"  (1.88 m)   Wt 287 lb 1.6 oz (130.2 kg)   SpO2 98%   BMI 36.86 kg/m    Subjective:    Patient ID: Warren [name] , male    DOB: [birth year], 70 y.o.   MRN: [id number]  HPI: Warren [name] [id number] is a 70 y.o. male  Chief Complaint  Patient presents with  . Hypertension    6 month follow up    Hypertension Using medications without difficulty Average home BPs 120s/70's   No problems or lightheadedness No chest pain with exertion or shortness of breath No Edema   Hyperlipidemia Using medications without problems: No Muscle aches  Diet compliance: Eating better Exercise: Exercising more    Relevant past medical, surgical, family and social history reviewed and updated as indicated. Interim medical history since our last visit reviewed. Allergies and medications reviewed and updated.  Review of Systems  Constitutional: Negative.   Respiratory: Negative.   Cardiovascular: Negative.   Genitourinary:       Cialis working well  Psychiatric/Behavioral: Negative.     Per HPI unless specifically indicated above     Objective:    BP 132/78   Pulse 68   Temp 97.8 F (36.6 C) (Oral)   Resp (!) 28   Ht 6\' 2"  (1.88 m)   Wt 287 lb 1.6 oz (130.2 kg)   SpO2 98%   BMI 36.86 kg/m   Wt Readings from Last 3 Encounters:  01/31/21 287 lb 1.6 oz (130.2 kg)  01/23/21 287 lb (130.2 kg)  10/31/20 291 lb (132 kg)    Physical Exam Constitutional:      General: He is not in acute distress.    Appearance: Normal appearance. He is well-developed.  HENT:     Head: Normocephalic and atraumatic.  Eyes:     General: Lids are normal. No scleral icterus.       Right eye: No discharge.        Left eye: No discharge.     Conjunctiva/sclera: Conjunctivae normal.  Neck:     Vascular: No carotid bruit or JVD.  Cardiovascular:     Rate and Rhythm: Normal rate and regular rhythm.     Heart sounds: Normal heart  sounds.  Pulmonary:     Effort: Pulmonary effort is normal. No respiratory distress.     Breath sounds: Normal breath sounds.  Abdominal:     Palpations: There is no hepatomegaly or splenomegaly.  Musculoskeletal:        General: Normal range of motion.     Cervical back: Normal range of motion and neck supple.  Skin:    General: Skin is warm and dry.     Coloration: Skin is not pale.     Findings: No rash.  Neurological:     Mental Status: He is alert and oriented to person, place, and time.  Psychiatric:        Behavior: Behavior normal.        Thought Content: Thought content normal.        Judgment: Judgment normal.     Results for orders placed or performed in visit on 12/24/19  Lipid panel  Result Value Ref Range   Cholesterol 208 (H) <200 mg/dL   HDL 39 ([name]) > OR = 40 mg/dL   Triglycerides 11/02/20 (H) <150 mg/dL   LDL Cholesterol (Calc)  138 (H) mg/dL (calc)   Total CHOL/HDL Ratio 5.3 (H) <5.0 (calc)   Non-HDL Cholesterol (Calc) 169 (H) <130 mg/dL (calc)  COMPLETE METABOLIC PANEL WITH GFR  Result Value Ref Range   Glucose, Bld 72 65 - 99 mg/dL   BUN 18 7 - 25 mg/dL   Creat 9.50 9.32 - 6.71 mg/dL   GFR, Est Non African American 71 > OR = 60 mL/min/1.50m2   GFR, Est African American 83 > OR = 60 mL/min/1.78m2   BUN/Creatinine Ratio NOT APPLICABLE 6 - 22 (calc)   Sodium 143 135 - 146 mmol/[name]   Potassium 3.8 3.5 - 5.3 mmol/[name]   Chloride 105 98 - 110 mmol/[name]   CO2 30 20 - 32 mmol/[name]   Calcium 9.3 8.6 - 10.3 mg/dL   Total Protein 7.0 6.1 - 8.1 g/dL   Albumin 4.4 3.6 - 5.1 g/dL   Globulin 2.6 1.9 - 3.7 g/dL (calc)   AG Ratio 1.7 1.0 - 2.5 (calc)   Total Bilirubin 0.4 0.2 - 1.2 mg/dL   Alkaline phosphatase (APISO) 43 35 - 144 U/[name]   AST 18 10 - 35 U/[name]   ALT 20 9 - 46 U/[name]  CBC with Differential/Platelet  Result Value Ref Range   WBC 6.8 3.8 - 10.8 Thousand/uL   RBC 5.14 4.20 - 5.80 Million/uL   Hemoglobin 15.2 13.2 - 17.1 g/dL   HCT 24.5 80.9 - 98.3 %   MCV 87.9 80.0 -  100.0 fL   MCH 29.6 27.0 - 33.0 pg   MCHC 33.6 32.0 - 36.0 g/dL   RDW 38.2 50.5 - 39.7 %   Platelets 273 140 - 400 Thousand/uL   MPV 10.2 7.5 - 12.5 fL   Neutro Abs 3,760 1,500 - 7,800 cells/uL   Lymphs Abs 2,108 850 - 3,900 cells/uL   Absolute Monocytes 551 200 - 950 cells/uL   Eosinophils Absolute 320 15 - 500 cells/uL   Basophils Absolute 61 0 - 200 cells/uL   Neutrophils Relative % 55.3 %   Total Lymphocyte 31.0 %   Monocytes Relative 8.1 %   Eosinophils Relative 4.7 %   Basophils Relative 0.9 %      Assessment & Plan:   Problem List Items Addressed This Visit      Unprioritized   Benign essential HTN (Chronic)    Stable, continue present medications.        Relevant Orders   Comprehensive metabolic panel   Elevated PSA - Primary   Relevant Orders   PSA   Erectile dysfunction    Stable, continue present medications.        Hyperlipidemia (Chronic)    Stable, continue present medications.        Relevant Orders   Comprehensive metabolic panel   Lipid panel       Follow up plan: Return in about 6 months (around 08/03/2021), or physical.

## 2021-02-01 ENCOUNTER — Encounter: Payer: Self-pay | Admitting: Unknown Physician Specialty

## 2021-02-01 LAB — PSA: PSA: 6.3 ng/mL — ABNORMAL HIGH (ref <=4.00)

## 2021-02-01 LAB — COMPREHENSIVE METABOLIC PANEL
AG Ratio: 1.8 (calc) (ref 1.0–2.5)
ALT: 28 U/L (ref 9–46)
AST: 27 U/L (ref 10–35)
Albumin: 4.6 g/dL (ref 3.6–5.1)
Alkaline phosphatase (APISO): 42 U/L (ref 35–144)
BUN: 19 mg/dL (ref 7–25)
CO2: 30 mmol/L (ref 20–32)
Calcium: 9.9 mg/dL (ref 8.6–10.3)
Chloride: 102 mmol/L (ref 98–110)
Creat: 1.13 mg/dL (ref 0.70–1.25)
Globulin: 2.5 g/dL (calc) (ref 1.9–3.7)
Glucose, Bld: 94 mg/dL (ref 65–99)
Potassium: 3.7 mmol/L (ref 3.5–5.3)
Sodium: 141 mmol/L (ref 135–146)
Total Bilirubin: 0.5 mg/dL (ref 0.2–1.2)
Total Protein: 7.1 g/dL (ref 6.1–8.1)

## 2021-02-01 LAB — LIPID PANEL
Cholesterol: 220 mg/dL — ABNORMAL HIGH (ref <200)
HDL: 36 mg/dL — ABNORMAL LOW (ref >=40)
LDL Cholesterol (Calc): 139 mg/dL (calc) — ABNORMAL HIGH
Non-HDL Cholesterol (Calc): 184 mg/dL (calc) — ABNORMAL HIGH (ref <130)
Total CHOL/HDL Ratio: 6.1 (calc) — ABNORMAL HIGH (ref <5.0)
Triglycerides: 317 mg/dL — ABNORMAL HIGH (ref <150)

## 2021-02-18 DIAGNOSIS — G4733 Obstructive sleep apnea (adult) (pediatric): Secondary | ICD-10-CM | POA: Diagnosis not present

## 2021-02-19 ENCOUNTER — Other Ambulatory Visit: Payer: Self-pay | Admitting: Family Medicine

## 2021-02-19 DIAGNOSIS — I10 Essential (primary) hypertension: Secondary | ICD-10-CM

## 2021-03-20 DIAGNOSIS — G4733 Obstructive sleep apnea (adult) (pediatric): Secondary | ICD-10-CM | POA: Diagnosis not present

## 2021-04-20 DIAGNOSIS — G4733 Obstructive sleep apnea (adult) (pediatric): Secondary | ICD-10-CM | POA: Diagnosis not present

## 2021-04-25 ENCOUNTER — Encounter: Payer: Self-pay | Admitting: Family Medicine

## 2021-04-25 ENCOUNTER — Other Ambulatory Visit (HOSPITAL_COMMUNITY)
Admission: RE | Admit: 2021-04-25 | Discharge: 2021-04-25 | Disposition: A | Discharge status: Home / Self Care | Payer: 59 | Source: Ambulatory Visit | Attending: Family Medicine | Admitting: Family Medicine

## 2021-04-25 ENCOUNTER — Other Ambulatory Visit: Payer: Self-pay

## 2021-04-25 ENCOUNTER — Ambulatory Visit (INDEPENDENT_AMBULATORY_CARE_PROVIDER_SITE_OTHER): Payer: 59 | Admitting: Family Medicine

## 2021-04-25 VITALS — BP 136/82 | HR 98 | Temp 98.3°F | Resp 16 | Ht 74.0 in | Wt 283.5 lb

## 2021-04-25 DIAGNOSIS — R350 Frequency of micturition: Secondary | ICD-10-CM | POA: Diagnosis not present

## 2021-04-25 DIAGNOSIS — R399 Unspecified symptoms and signs involving the genitourinary system: Secondary | ICD-10-CM | POA: Insufficient documentation

## 2021-04-25 DIAGNOSIS — E782 Mixed hyperlipidemia: Secondary | ICD-10-CM

## 2021-04-25 LAB — POCT URINALYSIS DIPSTICK
Bilirubin, UA: NEGATIVE
Glucose, UA: NEGATIVE
Ketones, UA: NEGATIVE
Nitrite, UA: NEGATIVE
Protein, UA: POSITIVE — AB
Spec Grav, UA: 1.02 (ref 1.010–1.025)
Urobilinogen, UA: 0.2 E.U./dL
pH, UA: 5 (ref 5.0–8.0)

## 2021-04-25 NOTE — Progress Notes (Signed)
Name: Warren [name]   MRN: [id number]    DOB: Jul 25, 1951   Date:04/25/2021       Progress Note  Chief Complaint  Patient presents with   Urinary Frequency    Since last night     Subjective:   Warren [name] is a 70 y.o. male, presents to clinic for urinary freq started last night frequency and urgency, trouble starting stream, usually wakes up 1-2 x a night, but last night he couldn't count how many times he was up last night  only thing different than normal about yesterday was her drank sweet tea late and he doesn't usually - 7 pm Feel like bladder is full, can't empty, double voiding No hematuria, discharge, scrotal or testicular pain/swelling, flank pain, N, V, change in bowels, fever  Results for orders placed or performed in visit on 04/25/21  POCT urinalysis dipstick  Result Value Ref Range   Color, UA gold    Clarity, UA clear    Glucose, UA Negative Negative   Bilirubin, UA negative    Ketones, UA negative    Spec Grav, UA 1.020 1.010 - 1.025   Blood, UA trace    pH, UA 5.0 5.0 - 8.0   Protein, UA Positive (A) Negative   Urobilinogen, UA 0.2 0.2 or 1.0 E.U./dL   Nitrite, UA negative    Leukocytes, UA Trace (A) Negative   Appearance clear    Odor strong    Dip reviewed today  Elevated PSA, BPH Last PSA and urology visits reviewed   Working  diligently on diet and exercise, avoiding salty foods and saturated fats      Current Outpatient Medications:    amLODipine (NORVASC) 5 MG tablet, TAKE 1 AND 1/2 TABLETS BY MOUTH EVERY DAY, Disp: 135 tablet, Rfl: 0   aspirin EC 81 MG tablet, Take 1 tablet (81 mg total) by mouth daily., Disp: , Rfl:    Omega-3 Fatty Acids (FISH OIL) 1200 MG CAPS, Take 1,200 mg by mouth., Disp: , Rfl:    tadalafil (CIALIS) 5 MG tablet, TAKE ONE TABLET BY MOUTH DAILY, Disp: 90 tablet, Rfl: 2   telmisartan-hydrochlorothiazide (MICARDIS HCT) 80-25 MG tablet, TAKE 1 TABLET BY MOUTH EVERY DAY FOR BLOOD PRESSURE, Disp: 90 tablet, Rfl: 0    rosuvastatin (CRESTOR) 10 MG tablet, Take 1 tablet (10 mg total) by mouth daily. (Patient not taking: No sig reported), Disp: 90 tablet, Rfl: 3  Patient Active Problem List   Diagnosis Date Noted   Obesity (BMI 30-39.9) 01/23/2021   OSA on CPAP 10/31/2020   PIN (prostatic intraepithelial neoplasia) 09/05/2017   Elevated PSA 06/07/2017   Abnormal EKG 08/06/2016   Erectile dysfunction 02/27/2016   Allergic rhinitis 01/12/2008   Hyperlipidemia 10/08/2007   Benign essential HTN 08/13/2007    Past Surgical History:  Procedure Laterality Date   HAMMER TOE SURGERY Bilateral 11/2015 - 01/2016   "right-left"   JOINT REPLACEMENT     SOFT TISSUE BIOPSY Right ~ 1986   "@ [facility]"   TOTAL KNEE ARTHROPLASTY Right 08/22/2016   Procedure: TOTAL KNEE ARTHROPLASTY;  Surgeon: Loreta Ave, MD;  Location: [facility] OR;  Service: Orthopedics;  Laterality: Right;   TOTAL KNEE ARTHROPLASTY Left 10/31/2016   TOTAL KNEE ARTHROPLASTY Left 10/31/2016   Procedure: TOTAL KNEE ARTHROPLASTY;  Surgeon: Loreta Ave, MD;  Location: [facility] OR;  Service: Orthopedics;  Laterality: Left;    Family History  Problem Relation Age of Onset   Heart disease Mother  Stroke Mother    Suicidality Father    Cancer Sister        breast    Social History   Tobacco Use   Smoking status: Never   Smokeless tobacco: Never  Vaping Use   Vaping Use: Never used  Substance Use Topics   Alcohol use: Yes    Alcohol/week: 0.0 standard drinks    Comment: 10/31/2016 "wine a couple times/month"   Drug use: No     No Known Allergies  Health Maintenance  Topic Date Due   COVID-19 Vaccine (4 - Booster for Pfizer series) 05/11/2021 (Originally 10/31/2020)   INFLUENZA VACCINE  06/11/2021 (Originally 04/10/2021)   Zoster Vaccines- Shingrix (1 of 2) 07/26/2021 (Originally 12/22/2002)   TETANUS/TDAP  04/25/2022 (Originally 02/07/2021)   PNA vac Low Risk Adult (1 of 2 - PCV13) 04/25/2022 (Originally 12/21/2017)   COLONOSCOPY (Pts 45-12yrs  Insurance coverage will need to be confirmed)  04/24/2023   Hepatitis C Screening  Completed   HPV VACCINES  Aged Out    Chart Review Today: I personally reviewed active problem list, medication list, allergies, family history, social history, health maintenance, notes from last encounter, lab results, imaging with the patient/caregiver today.   Review of Systems  Constitutional: Negative.   HENT: Negative.    Eyes: Negative.   Respiratory: Negative.    Cardiovascular: Negative.   Gastrointestinal: Negative.   Endocrine: Negative.   Genitourinary: Negative.   Musculoskeletal: Negative.   Skin: Negative.   Allergic/Immunologic: Negative.   Neurological: Negative.   Hematological: Negative.   Psychiatric/Behavioral: Negative.    All other systems reviewed and are negative.   Objective:   Vitals:   04/25/21 1328  BP: 136/82  Pulse: 98  Resp: 16  Temp: 98.3 F (36.8 C)  TempSrc: Oral  SpO2: 98%  Weight: 283 lb 8 oz (128.6 kg)  Height: 6\' 2"  (1.88 m)    Body mass index is 36.4 kg/m.  Physical Exam Vitals and nursing note reviewed.  Constitutional:      General: He is not in acute distress.    Appearance: Normal appearance. He is well-developed. He is obese. He is not ill-appearing, toxic-appearing or diaphoretic.     Interventions: Face mask in place.  HENT:     Head: Normocephalic and atraumatic.     Jaw: No trismus.     Right Ear: External ear normal.     Left Ear: External ear normal.     Mouth/Throat:     Mouth: Mucous membranes are moist.     Pharynx: Oropharynx is clear. No oropharyngeal exudate.  Eyes:     General: Lids are normal. No scleral icterus.       Right eye: No discharge.        Left eye: No discharge.     Conjunctiva/sclera: Conjunctivae normal.  Neck:     Trachea: Trachea and phonation normal. No tracheal deviation.  Cardiovascular:     Rate and Rhythm: Normal rate and regular rhythm.     Pulses: Normal pulses.          Radial pulses  are 2+ on the right side and 2+ on the left side.     Heart sounds: Normal heart sounds.  Pulmonary:     Effort: Pulmonary effort is normal.     Breath sounds: Normal breath sounds.  Abdominal:     General: Bowel sounds are normal. There is no distension.     Palpations: Abdomen is soft.     Tenderness:  There is no abdominal tenderness. There is no right CVA tenderness, left CVA tenderness or guarding.  Musculoskeletal:     Right lower leg: No edema.     Left lower leg: No edema.  Skin:    General: Skin is warm and dry.     Coloration: Skin is not jaundiced.     Findings: No rash.     Nails: There is no clubbing.  Neurological:     Mental Status: He is alert. Mental status is at baseline.     Cranial Nerves: No dysarthria or facial asymmetry.     Motor: No tremor or abnormal muscle tone.     Gait: Gait normal.  Psychiatric:        Mood and Affect: Mood normal.        Speech: Speech normal.        Behavior: Behavior normal. Behavior is cooperative.        Assessment & Plan:     ICD-10-CM   1. Urinary frequency  R35.0 POCT urinalysis dipstick    PSA    CBC with Differential/Platelet    Hemoglobin A1c    COMPLETE METABOLIC PANEL WITH GFR    Urine Culture    Urine cytology ancillary only   worse sx, he wants to r/o infection, recheck PSA with prior elevation - much higher would suggest prostatitis, may have been what he drank, doubt UTI    2. Lower urinary tract symptoms (LUTS)  R39.9 PSA    CBC with Differential/Platelet    COMPLETE METABOLIC PANEL WITH GFR    Urine Culture    Urine cytology ancillary only   worse than his normal, r/o increase PSA, infection UTI and STD, hyperglycemia    3. Mixed hyperlipidemia  E78.2 Lipid panel   working on diet/lifestyle - he will recheck lipids in a few months - wants to do a few more months strict diet and check labs prior to holidays     Sx have improved since onset, but not completely resolved Treatment plan pending  results    Danelle Berry, PA-C 04/25/21 1:54 PM

## 2021-04-26 LAB — CBC WITH DIFFERENTIAL/PLATELET
Absolute Monocytes: 469 cells/uL (ref 200–950)
Basophils Absolute: 49 cells/uL (ref 0–200)
Basophils Relative: 0.7 %
Eosinophils Absolute: 301 cells/uL (ref 15–500)
Eosinophils Relative: 4.3 %
HCT: 45.9 % (ref 38.5–50.0)
Hemoglobin: 15.7 g/dL (ref 13.2–17.1)
Lymphs Abs: 1897 cells/uL (ref 850–3900)
MCH: 30.3 pg (ref 27.0–33.0)
MCHC: 34.2 g/dL (ref 32.0–36.0)
MCV: 88.6 fL (ref 80.0–100.0)
MPV: 10.5 fL (ref 7.5–12.5)
Monocytes Relative: 6.7 %
Neutro Abs: 4284 cells/uL (ref 1500–7800)
Neutrophils Relative %: 61.2 %
Platelets: 286 10*3/uL (ref 140–400)
RBC: 5.18 10*6/uL (ref 4.20–5.80)
RDW: 12.5 % (ref 11.0–15.0)
Total Lymphocyte: 27.1 %
WBC: 7 10*3/uL (ref 3.8–10.8)

## 2021-04-26 LAB — URINE CULTURE
MICRO NUMBER:: 12250315
SPECIMEN QUALITY:: ADEQUATE

## 2021-04-26 LAB — COMPLETE METABOLIC PANEL WITH GFR
AG Ratio: 1.7 (calc) (ref 1.0–2.5)
ALT: 27 U/L (ref 9–46)
AST: 23 U/L (ref 10–35)
Albumin: 4.7 g/dL (ref 3.6–5.1)
Alkaline phosphatase (APISO): 44 U/L (ref 35–144)
BUN: 16 mg/dL (ref 7–25)
CO2: 31 mmol/L (ref 20–32)
Calcium: 9.6 mg/dL (ref 8.6–10.3)
Chloride: 102 mmol/L (ref 98–110)
Creat: 1.17 mg/dL (ref 0.70–1.35)
Globulin: 2.7 g/dL (calc) (ref 1.9–3.7)
Glucose, Bld: 68 mg/dL (ref 65–99)
Potassium: 3.8 mmol/L (ref 3.5–5.3)
Sodium: 142 mmol/L (ref 135–146)
Total Bilirubin: 0.7 mg/dL (ref 0.2–1.2)
Total Protein: 7.4 g/dL (ref 6.1–8.1)
eGFR: 68 mL/min/{1.73_m2} (ref >=60)

## 2021-04-26 LAB — HEMOGLOBIN A1C
Hgb A1c MFr Bld: 5.4 % of total Hgb (ref <5.7)
Mean Plasma Glucose: 108 mg/dL
eAG (mmol/L): 6 mmol/L

## 2021-04-26 LAB — PSA: PSA: 5.9 ng/mL — ABNORMAL HIGH (ref <=4.00)

## 2021-04-27 LAB — URINE CYTOLOGY ANCILLARY ONLY
Chlamydia: NEGATIVE
Comment: NEGATIVE
Comment: NEGATIVE
Comment: NORMAL
Neisseria Gonorrhea: NEGATIVE
Trichomonas: NEGATIVE

## 2021-05-01 ENCOUNTER — Encounter: Payer: Self-pay | Admitting: Family Medicine

## 2021-05-21 DIAGNOSIS — G4733 Obstructive sleep apnea (adult) (pediatric): Secondary | ICD-10-CM | POA: Diagnosis not present

## 2021-06-12 ENCOUNTER — Other Ambulatory Visit: Payer: Self-pay | Admitting: Unknown Physician Specialty

## 2021-06-17 ENCOUNTER — Other Ambulatory Visit: Payer: Self-pay | Admitting: Unknown Physician Specialty

## 2021-06-17 DIAGNOSIS — I10 Essential (primary) hypertension: Secondary | ICD-10-CM

## 2021-06-17 NOTE — Telephone Encounter (Signed)
Requested Prescriptions  Pending Prescriptions Disp Refills  . telmisartan-hydrochlorothiazide (MICARDIS HCT) 80-25 MG tablet [Pharmacy Med Name: TELMISARTAN-HCTZ 80-25 MG TAB] 90 tablet 0    Sig: TAKE 1 TABLET BY MOUTH EVERY DAY FOR BLOOD PRESSURE     Cardiovascular: ARB + Diuretic Combos Passed - 06/17/2021  9:05 AM      Passed - K in normal range and within 180 days    Potassium  Date Value Ref Range Status  04/25/2021 3.8 3.5 - 5.3 mmol/L Final         Passed - Na in normal range and within 180 days    Sodium  Date Value Ref Range Status  04/25/2021 142 135 - 146 mmol/L Final  01/28/2018 140 134 - 144 mmol/L Final         Passed - Cr in normal range and within 180 days    Creat  Date Value Ref Range Status  04/25/2021 1.17 0.70 - 1.35 mg/dL Final         Passed - Ca in normal range and within 180 days    Calcium  Date Value Ref Range Status  04/25/2021 9.6 8.6 - 10.3 mg/dL Final         Passed - Patient is not pregnant      Passed - Last BP in normal range    BP Readings from Last 1 Encounters:  04/25/21 136/82         Passed - Valid encounter within last 6 months    Recent Outpatient Visits          1 month ago Urinary frequency   [facility] Danelle Berry, PA-C   4 months ago Elevated PSA   [facility] Gabriel Cirri, NP   1 year ago Adult general medical exam   [facility] Danelle Berry, PA-C   2 years ago Benign essential HTN   [facility] [place], Percell Belt, NP   2 years ago Benign essential HTN   [facility], Janit Bern, MD      Future Appointments            In 3 weeks  [facility]   In 1 month Danelle Berry, PA-C [facility], [facility]

## 2021-06-20 DIAGNOSIS — G4733 Obstructive sleep apnea (adult) (pediatric): Secondary | ICD-10-CM | POA: Diagnosis not present

## 2021-07-13 ENCOUNTER — Ambulatory Visit: Payer: Medicare Other

## 2021-07-20 ENCOUNTER — Other Ambulatory Visit: Payer: Self-pay | Admitting: Urology

## 2021-07-21 DIAGNOSIS — G4733 Obstructive sleep apnea (adult) (pediatric): Secondary | ICD-10-CM | POA: Diagnosis not present

## 2021-08-01 NOTE — Progress Notes (Signed)
08/02/21 8:31 AM   Warren [name] [place] July 11, 1951 [id number]  Referring provider:  Danelle Berry, PA-C [address] [place],  [place] [address] Chief Complaint  Patient presents with   Erectile Dysfunction     HPI: Warren [name] [place] is a 70 y.o.male with a personal history of elevated PSA, erectile dysfunction, and multiple GU issues, who presents today for medication refill.   He was last seen in clinic on 03/18/2019.   Personal history of elevated PSA in the 5-6 range. Status post prostate biopsy 07/2017 without evidence of malignancy.  There was high-grade PIN and a small adjacent focus of atypical glands. TRUS volume 21.6 cc.  His most recent PSA as of 04/25/2021 was 5.90 with a previous PSA of 6.30 on 01/31/2021.   Erectile dysfunction is managed on Cialis. He is taking this daily, 5 mg which has also helped with his urinary symptoms.    He is doing well today. He reports that he experiences mild urgency but it his due to behavior. He does not experience urination during the nighttime. He notices that his urinary urgency heightens when he intakes liquids high in sugar.     SHIM     Row Name 08/02/21 0818         SHIM: Over the last 6 months:   How do you rate your confidence that you could get and keep an erection? Moderate     When you had erections with sexual stimulation, how often were your erections hard enough for penetration (entering your partner)? Almost Always or Always     During sexual intercourse, how often were you able to maintain your erection after you had penetrated (entered) your partner? Almost Always or Always     During sexual intercourse, how difficult was it to maintain your erection to completion of intercourse? Not Difficult     When you attempted sexual intercourse, how often was it satisfactory for you? Almost Always or Always       SHIM Total Score   SHIM 23              PMH: Past Medical History:  Diagnosis Date   Elevated  PSA 06/07/2017   6.2 -- refer to urologist Sept 2018   Erectile dysfunction 02/27/2016   Hypercholesterolemia without hypertriglyceridemia 10/08/2007   Hypertension    Malignant hyperthermia 1986   "told I had maybe a mild case of imalignant hyperthermia at [facility]"   Obesity 02/24/2015   OSA on CPAP 02/24/2015   CPAP q night    Osteoarthritis    "feet and knees"  (10/31/2016)   PIN (prostatic intraepithelial neoplasia) 09/05/2017   Prostate biopsy Nov 2018    Surgical History: Past Surgical History:  Procedure Laterality Date   HAMMER TOE SURGERY Bilateral 11/2015 - 01/2016   "right-left"   JOINT REPLACEMENT     SOFT TISSUE BIOPSY Right ~ 1986   "@ [facility]"   TOTAL KNEE ARTHROPLASTY Right 08/22/2016   Procedure: TOTAL KNEE ARTHROPLASTY;  Surgeon: Loreta Ave, MD;  [facility];  Service: Orthopedics;  Laterality: Right;   TOTAL KNEE ARTHROPLASTY Left 10/31/2016   TOTAL KNEE ARTHROPLASTY Left 10/31/2016   Procedure: TOTAL KNEE ARTHROPLASTY;  Surgeon: Loreta Ave, MD;  Location: [facility] OR;  Service: Orthopedics;  Laterality: Left;    Home Medications:  Allergies as of 08/02/2021   No Known Allergies      Medication List        Accurate as of August 02, 2021  8:31 AM. If you have any questions, ask your nurse or doctor.          amLODipine 5 MG tablet Commonly known as: NORVASC TAKE 1.5 TABLETS BY MOUTH EVERY DAY   aspirin EC 81 MG tablet Take 1 tablet (81 mg total) by mouth daily.   Fish Oil 1200 MG Caps Take 1,200 mg by mouth.   tadalafil 5 MG tablet Commonly known as: CIALIS TAKE ONE TABLET BY MOUTH DAILY   telmisartan-hydrochlorothiazide 80-25 MG tablet Commonly known as: MICARDIS HCT TAKE 1 TABLET BY MOUTH EVERY DAY FOR BLOOD PRESSURE        Allergies: No Known Allergies  Family History: Family History  Problem Relation Age of Onset   Heart disease Mother    Stroke Mother    Suicidality Father    Cancer Sister        breast    Social  History:  reports that he has never smoked. He has never used smokeless tobacco. He reports current alcohol use. He reports that he does not use drugs.   Physical Exam: BP (!) 154/87   Pulse 65   Ht 6\' 2"  (1.88 m)   Wt 283 lb (128.4 kg)   BMI 36.34 kg/m   Constitutional:  Alert and oriented, No acute distress. HEENT: [place] AT, moist mucus membranes.  Trachea midline, no masses. Cardiovascular: No clubbing, cyanosis, or edema. Respiratory: Normal respiratory effort, no increased work of breathing. Rectal: Normal sphincter tone,  40  CC prostate, smooth no nodules Skin: No rashes, bruises or suspicious lesions. Neurologic: Grossly intact, no focal deficits, moving all 4 extremities. Psychiatric: Normal mood and affect.  Laboratory Data:  Lab Results  Component Value Date   CREATININE 1.17 04/25/2021    Lab Results  Component Value Date   PSA 5.90 (H) 04/25/2021   PSA 6.30 (H) 01/31/2021    Lab Results  Component Value Date   HGBA1C 5.4 04/25/2021    Assessment & Plan:    BPH with urinary obstruction - Stream improved on daily Cialis 5 mg daily, refilled today -No interested in any other medications or treatments  Elevated PSA  - PSA has remained stably elevated between the 5-6 range, s/p previous extensive work up including biopsy - Will continue to monitor annually   2. Erectile dysfunction  - Continue daily Cialis 5 mg.     Return in 1 year (08/02/2022) for PSA.   I,Kailey Littlejohn,acting as a 08/04/2022 for Neurosurgeon, MD.,have documented all relevant documentation on the behalf of Warren Scotland, MD,as directed by  Warren Scotland, MD while in the presence of Warren Scotland, MD.  I have reviewed the above documentation for accuracy and completeness, and I agree with the above.   Warren Scotland, MD   [facility] [address] [place], [place] [phone]'

## 2021-08-02 ENCOUNTER — Ambulatory Visit (INDEPENDENT_AMBULATORY_CARE_PROVIDER_SITE_OTHER): Payer: 59 | Admitting: Urology

## 2021-08-02 ENCOUNTER — Other Ambulatory Visit: Payer: Self-pay

## 2021-08-02 ENCOUNTER — Encounter: Payer: Self-pay | Admitting: Urology

## 2021-08-02 VITALS — BP 154/87 | HR 65 | Ht 74.0 in | Wt 283.0 lb

## 2021-08-02 DIAGNOSIS — R972 Elevated prostate specific antigen [PSA]: Secondary | ICD-10-CM | POA: Diagnosis not present

## 2021-08-02 MED ORDER — TADALAFIL 5 MG PO TABS: 5.0000 mg | ORAL_TABLET | Freq: Every day | ORAL | 3 refills | Status: DC

## 2021-08-08 ENCOUNTER — Ambulatory Visit (INDEPENDENT_AMBULATORY_CARE_PROVIDER_SITE_OTHER): Payer: 59 | Admitting: Family Medicine

## 2021-08-08 ENCOUNTER — Encounter: Payer: Self-pay | Admitting: Family Medicine

## 2021-08-08 VITALS — BP 132/64 | HR 73 | Temp 97.5°F | Resp 16 | Ht 74.0 in | Wt 281.7 lb

## 2021-08-08 DIAGNOSIS — I10 Essential (primary) hypertension: Secondary | ICD-10-CM | POA: Diagnosis not present

## 2021-08-08 DIAGNOSIS — E785 Hyperlipidemia, unspecified: Secondary | ICD-10-CM

## 2021-08-08 DIAGNOSIS — Z0000 Encounter for general adult medical examination without abnormal findings: Secondary | ICD-10-CM

## 2021-08-08 MED ORDER — TELMISARTAN-HCTZ 80-25 MG PO TABS: ORAL_TABLET | ORAL | 1 refills | Status: DC

## 2021-08-08 MED ORDER — AMLODIPINE BESYLATE 5 MG PO TABS: ORAL_TABLET | ORAL | 1 refills | Status: DC

## 2021-08-08 NOTE — Progress Notes (Signed)
Name: Warren [name]   MRN: [id number]    DOB: 02-27-1951   Date:08/08/2021       Progress Note  Chief Complaint  Patient presents with   Hypertension   Hyperlipidemia     Subjective:   Warren [name] is a 70 y.o. male, presents to clinic for follow up  HLD - pt wished to wait a few more months and repeat labs today, not on statin,   Hypertension:  Currently managed on micardis 80-25 and amlodipine 7.5 mg monitoring at home all the time averages 135/70 Pt reports good med compliance and denies any SE.   Blood pressure today is well controlled. BP Readings from Last 3 Encounters:  08/08/21 132/64  08/02/21 (!) 154/87  04/25/21 136/82   Pt denies CP, SOB, exertional sx, LE edema, palpitation, Ha's, visual disturbances, lightheadedness, hypotension, syncope. Dietary efforts for BP?  Working on increasing exercising and cutting back on certain foods  Not on statins Lab Results  Component Value Date   CHOL 220 (H) 01/31/2021   HDL 36 (L) 01/31/2021   LDLCALC 139 (H) 01/31/2021   TRIG 317 (H) 01/31/2021   CHOLHDL 6.1 (H) 01/31/2021  H wanted to work on diet/exercise/lifestyle and recheck in 6 months  The 10-year ASCVD risk score (Arnett DK, et al., 2019) is: 21.1%   Values used to calculate the score:     Age: 66 years     Sex: Male     Is Non-Hispanic African American: Yes     Diabetic: No     Tobacco smoker: No     Systolic Blood Pressure: 132 mmHg     Is BP treated: Yes     HDL Cholesterol: 36 mg/dL     Total Cholesterol: 220 mg/dL  Reviewed ASCVD and statin meds     Current Outpatient Medications:    amLODipine (NORVASC) 5 MG tablet, TAKE 1.5 TABLETS BY MOUTH EVERY DAY, Disp: 135 tablet, Rfl: 0   aspirin EC 81 MG tablet, Take 1 tablet (81 mg total) by mouth daily., Disp: , Rfl:    Omega-3 Fatty Acids (FISH OIL) 1200 MG CAPS, Take 1,200 mg by mouth., Disp: , Rfl:    tadalafil (CIALIS) 5 MG tablet, Take 1 tablet (5 mg total) by mouth daily., Disp: 90 tablet,  Rfl: 3   telmisartan-hydrochlorothiazide (MICARDIS HCT) 80-25 MG tablet, TAKE 1 TABLET BY MOUTH EVERY DAY FOR BLOOD PRESSURE, Disp: 90 tablet, Rfl: 0  Patient Active Problem List   Diagnosis Date Noted   Obesity (BMI 30-39.9) 01/23/2021   OSA on CPAP 10/31/2020   PIN (prostatic intraepithelial neoplasia) 09/05/2017   Elevated PSA 06/07/2017   Abnormal EKG 08/06/2016   Erectile dysfunction 02/27/2016   Allergic rhinitis 01/12/2008   Hyperlipidemia 10/08/2007   Benign essential HTN 08/13/2007    Past Surgical History:  Procedure Laterality Date   HAMMER TOE SURGERY Bilateral 11/2015 - 01/2016   "right-left"   JOINT REPLACEMENT     SOFT TISSUE BIOPSY Right ~ 1986   "@ [facility]"   TOTAL KNEE ARTHROPLASTY Right 08/22/2016   Procedure: TOTAL KNEE ARTHROPLASTY;  Surgeon: Loreta Ave, MD;  Location: [facility] OR;  Service: Orthopedics;  Laterality: Right;   TOTAL KNEE ARTHROPLASTY Left 10/31/2016   TOTAL KNEE ARTHROPLASTY Left 10/31/2016   Procedure: TOTAL KNEE ARTHROPLASTY;  Surgeon: Loreta Ave, MD;  Location: [facility] OR;  Service: Orthopedics;  Laterality: Left;    Family History  Problem Relation Age of Onset   Heart disease  Mother    Stroke Mother    Suicidality Father    Cancer Sister        breast    Social History   Tobacco Use   Smoking status: Never   Smokeless tobacco: Never  Vaping Use   Vaping Use: Never used  Substance Use Topics   Alcohol use: Yes    Alcohol/week: 0.0 standard drinks    Comment: 10/31/2016 "wine a couple times/month"   Drug use: No     No Known Allergies  Health Maintenance  Topic Date Due   COVID-19 Vaccine (4 - Booster for Pfizer series) 08/25/2020   Zoster Vaccines- Shingrix (1 of 2) 11/07/2021 (Originally 12/22/2002)   INFLUENZA VACCINE  12/08/2021 (Originally 04/10/2021)   TETANUS/TDAP  04/25/2022 (Originally 02/07/2021)   Pneumonia Vaccine 28+ Years old (1 - PCV) 08/08/2022 (Originally 12/21/2017)   COLONOSCOPY (Pts 45-78yrs Insurance  coverage will need to be confirmed)  04/24/2023   Hepatitis C Screening  Completed   HPV VACCINES  Aged Out    Chart Review Today: I personally reviewed active problem list, medication list, allergies, family history, social history, health maintenance, notes from last encounter, lab results, imaging with the patient/caregiver today.   Review of Systems  Constitutional: Negative.   HENT: Negative.    Eyes: Negative.   Respiratory: Negative.    Cardiovascular: Negative.   Gastrointestinal: Negative.   Endocrine: Negative.   Genitourinary: Negative.   Musculoskeletal: Negative.   Skin: Negative.   Allergic/Immunologic: Negative.   Neurological: Negative.   Hematological: Negative.   Psychiatric/Behavioral: Negative.    All other systems reviewed and are negative.   Objective:   Vitals:   08/08/21 0829  BP: 132/64  Pulse: 73  Resp: 16  Temp: (!) 97.5 F (36.4 C)  TempSrc: Oral  SpO2: 98%  Weight: 281 lb 11.2 oz (127.8 kg)  Height: 6\' 2"  (1.88 m)    Body mass index is 36.17 kg/m.  Physical Exam Vitals and nursing note reviewed.  Constitutional:      General: He is not in acute distress.    Appearance: Normal appearance. He is well-developed and well-groomed. He is obese. He is not ill-appearing, toxic-appearing or diaphoretic.     Interventions: Face mask in place.  HENT:     Head: Normocephalic and atraumatic.     Jaw: No trismus.     Right Ear: External ear normal.     Left Ear: External ear normal.  Eyes:     General: Lids are normal. No scleral icterus.       Right eye: No discharge.        Left eye: No discharge.     Conjunctiva/sclera: Conjunctivae normal.  Neck:     Trachea: Trachea and phonation normal. No tracheal deviation.  Cardiovascular:     Rate and Rhythm: Normal rate and regular rhythm.     Pulses: Normal pulses.          Radial pulses are 2+ on the right side and 2+ on the left side.       Posterior tibial pulses are 2+ on the right side  and 2+ on the left side.     Heart sounds: Normal heart sounds. No murmur heard.   No friction rub. No gallop.  Pulmonary:     Effort: Pulmonary effort is normal. No respiratory distress.     Breath sounds: Normal breath sounds. No stridor. No wheezing, rhonchi or rales.  Abdominal:     General: Bowel sounds  are normal. There is no distension.     Palpations: Abdomen is soft.  Musculoskeletal:     Right lower leg: No edema.     Left lower leg: No edema.  Skin:    General: Skin is warm and dry.     Coloration: Skin is not jaundiced.     Findings: No rash.     Nails: There is no clubbing.  Neurological:     Mental Status: He is alert. Mental status is at baseline.     Cranial Nerves: No dysarthria or facial asymmetry.     Motor: No tremor or abnormal muscle tone.     Gait: Gait normal.  Psychiatric:        Mood and Affect: Mood normal.        Speech: Speech normal.        Behavior: Behavior normal. Behavior is cooperative.        Assessment & Plan:      ICD-10-CM   1. Benign essential HTN  I10 COMPLETE METABOLIC PANEL WITH GFR    telmisartan-hydrochlorothiazide (MICARDIS HCT) 80-25 MG tablet    amLODipine (NORVASC) 5 MG tablet   stable, well controlled, continue DASH, may decrease amlodipine to 5 mg and monitor at home, goal <130/70    2. Hyperlipidemia, unspecified hyperlipidemia type  E78.5 Lipid panel   recheck labs, recalculate ASCVD risk - will send risk and statin recommendation to pt, likely statin will still be indicated       Return in about 6 months (around 02/05/2022) for Routine follow-up.   Danelle Berry, PA-C 08/08/21 8:48 AM

## 2021-08-09 LAB — LIPID PANEL
Cholesterol: 197 mg/dL (ref <200)
HDL: 38 mg/dL — ABNORMAL LOW (ref >=40)
LDL Cholesterol (Calc): 131 mg/dL (calc) — ABNORMAL HIGH
Non-HDL Cholesterol (Calc): 159 mg/dL (calc) — ABNORMAL HIGH (ref <130)
Total CHOL/HDL Ratio: 5.2 (calc) — ABNORMAL HIGH (ref <5.0)
Triglycerides: 161 mg/dL — ABNORMAL HIGH (ref <150)

## 2021-08-09 LAB — COMPLETE METABOLIC PANEL WITH GFR
AG Ratio: 1.4 (calc) (ref 1.0–2.5)
ALT: 23 U/L (ref 9–46)
AST: 21 U/L (ref 10–35)
Albumin: 4.2 g/dL (ref 3.6–5.1)
Alkaline phosphatase (APISO): 43 U/L (ref 35–144)
BUN: 16 mg/dL (ref 7–25)
CO2: 29 mmol/L (ref 20–32)
Calcium: 9.5 mg/dL (ref 8.6–10.3)
Chloride: 104 mmol/L (ref 98–110)
Creat: 1.04 mg/dL (ref 0.70–1.35)
Globulin: 2.9 g/dL (calc) (ref 1.9–3.7)
Glucose, Bld: 101 mg/dL — ABNORMAL HIGH (ref 65–99)
Potassium: 3.6 mmol/L (ref 3.5–5.3)
Sodium: 141 mmol/L (ref 135–146)
Total Bilirubin: 0.5 mg/dL (ref 0.2–1.2)
Total Protein: 7.1 g/dL (ref 6.1–8.1)
eGFR: 78 mL/min/{1.73_m2} (ref >=60)

## 2021-08-15 ENCOUNTER — Telehealth: Payer: Self-pay | Admitting: Family Medicine

## 2021-08-15 NOTE — Telephone Encounter (Signed)
Copied from CRM [id number]. Topic: Medicare AWV >> Aug 15, 2021  3:11 PM Claudette Laws R wrote: Reason for CRM: Left message for patient to call back and schedule Medicare Annual Wellness Visit (AWV) in office.   If unable to come into the office for AWV,  please offer to do virtually or by telephone.  Last AWV: 07/12/2020  Please schedule at anytime with [facility] Health Advisor.  40 minute appointment  Any questions, please contact me at [phone]

## 2021-08-20 DIAGNOSIS — G4733 Obstructive sleep apnea (adult) (pediatric): Secondary | ICD-10-CM | POA: Diagnosis not present

## 2022-01-30 ENCOUNTER — Telehealth: Payer: Self-pay | Admitting: Family Medicine

## 2022-01-30 NOTE — Telephone Encounter (Signed)
Copied from [place] [id number]. Topic: Medicare AWV >> Jan 30, 2022  1:53 PM Cher Nakai R wrote: Reason for CRM:  Left message for patient to call back and schedule Medicare Annual Wellness Visit (AWV) in office.   If unable to come into the office for AWV,  please offer to do virtually or by telephone.  Last AWV:  07/12/2020  Please schedule at anytime with [place].  30 minute appointment for Virtual or phone 45 minute appointment for in office or Initial virtual/phone  Any questions, please contact me at [phone]

## 2022-02-08 ENCOUNTER — Ambulatory Visit: Payer: 59 | Admitting: Family Medicine

## 2022-02-08 ENCOUNTER — Ambulatory Visit (INDEPENDENT_AMBULATORY_CARE_PROVIDER_SITE_OTHER): Payer: 59 | Admitting: Family Medicine

## 2022-02-08 ENCOUNTER — Encounter: Payer: Self-pay | Admitting: Family Medicine

## 2022-02-08 VITALS — BP 128/72 | HR 75 | Temp 98.4°F | Resp 16 | Ht 74.0 in | Wt 288.7 lb

## 2022-02-08 DIAGNOSIS — G4733 Obstructive sleep apnea (adult) (pediatric): Secondary | ICD-10-CM | POA: Diagnosis not present

## 2022-02-08 DIAGNOSIS — Z9989 Dependence on other enabling machines and devices: Secondary | ICD-10-CM | POA: Diagnosis not present

## 2022-02-08 DIAGNOSIS — R399 Unspecified symptoms and signs involving the genitourinary system: Secondary | ICD-10-CM

## 2022-02-08 DIAGNOSIS — I10 Essential (primary) hypertension: Secondary | ICD-10-CM | POA: Diagnosis not present

## 2022-02-08 DIAGNOSIS — E785 Hyperlipidemia, unspecified: Secondary | ICD-10-CM | POA: Diagnosis not present

## 2022-02-08 DIAGNOSIS — Z6837 Body mass index (BMI) 37.0-37.9, adult: Secondary | ICD-10-CM

## 2022-02-08 MED ORDER — TELMISARTAN-HCTZ 80-25 MG PO TABS: ORAL_TABLET | ORAL | 1 refills | Status: DC

## 2022-02-08 MED ORDER — AMLODIPINE BESYLATE 5 MG PO TABS: ORAL_TABLET | ORAL | 1 refills | Status: DC

## 2022-02-08 NOTE — Progress Notes (Signed)
Name: Warren [name] [place]   MRN: [id number]    DOB: 1950-10-08   Date:02/08/2022       Progress Note  Chief Complaint  Patient presents with   Follow-up   Hypertension     Subjective:   Warren [name] [place] is a 71 y.o. male, presents to clinic for follow up  Hes worked on diet, exercise, not loosing tons of weight but waist slimmer and gained some muscle, he feels much healthier  Walking more cutting out processed foods   Hypertension:  dose decreased and BP still at goal Currently managed on norvasc 5 mg and telmisartan-HCTZ 80-25 Pt reports good med compliance and denies any SE.   Blood pressure today is well controlled.  Home monitoring 120's/70's BP Readings from Last 3 Encounters:  02/08/22 128/72  08/08/21 132/64  08/02/21 (!) 154/87   Pt denies CP, SOB, exertional sx, LE edema, palpitation, Ha's, visual disturbances, lightheadedness, hypotension, syncope. Dietary efforts for BP?  See above  Hyperlipidemia: Not on meds Last Lipids: Lab Results  Component Value Date   CHOL 197 08/08/2021   HDL 38 ([name]) 08/08/2021   LDLCALC 131 (H) 08/08/2021   TRIG 161 (H) 08/08/2021   CHOLHDL 5.2 (H) 08/08/2021  Feels good, denies - Chest pain, shortness of breath, myalgias, claudication  The 10-year ASCVD risk score (Arnett DK, et al., 2019) is: 19.8%   Values used to calculate the score:     Age: 59 years     Sex: Male     Is Non-Hispanic African American: Yes     Diabetic: No     Tobacco smoker: No     Systolic Blood Pressure: 128 mmHg     Is BP treated: Yes     HDL Cholesterol: 38 mg/dL     Total Cholesterol: 197 mg/dL   We monitored Y1P previously Recent pertinent labs: Lab Results  Component Value Date   HGBA1C 5.4 04/25/2021   HGBA1C 5.4 01/28/2018      Current Outpatient Medications:    amLODipine (NORVASC) 5 MG tablet, TAKE 1.5 TABLETS BY MOUTH EVERY DAY, Disp: 135 tablet, Rfl: 1   aspirin EC 81 MG tablet, Take 1 tablet (81 mg total) by mouth daily., Disp: , Rfl:     Omega-3 Fatty Acids (FISH OIL) 1200 MG CAPS, Take 1,200 mg by mouth., Disp: , Rfl:    tadalafil (CIALIS) 5 MG tablet, Take 1 tablet (5 mg total) by mouth daily., Disp: 90 tablet, Rfl: 3   telmisartan-hydrochlorothiazide (MICARDIS HCT) 80-25 MG tablet, TAKE 1 TABLET BY MOUTH EVERY DAY FOR BLOOD PRESSURE, Disp: 90 tablet, Rfl: 1  Patient Active Problem List   Diagnosis Date Noted   Obesity (BMI 30-39.9) 01/23/2021   OSA on CPAP 10/31/2020   PIN (prostatic intraepithelial neoplasia) 09/05/2017   Elevated PSA 06/07/2017   Abnormal EKG 08/06/2016   Erectile dysfunction 02/27/2016   Allergic rhinitis 01/12/2008   Hyperlipidemia 10/08/2007   Benign essential HTN 08/13/2007    Past Surgical History:  Procedure Laterality Date   HAMMER TOE SURGERY Bilateral 11/2015 - 01/2016   "right-left"   JOINT REPLACEMENT     SOFT TISSUE BIOPSY Right ~ 1986   "@ [facility]"   TOTAL KNEE ARTHROPLASTY Right 08/22/2016   Procedure: TOTAL KNEE ARTHROPLASTY;  Surgeon: Loreta Ave, MD;  Location: [facility] OR;  Service: Orthopedics;  Laterality: Right;   TOTAL KNEE ARTHROPLASTY Left 10/31/2016   TOTAL KNEE ARTHROPLASTY Left 10/31/2016   Procedure: TOTAL KNEE ARTHROPLASTY;  Surgeon: Reuel Boom  Georg Ruddle, MD;  Location: [facility] OR;  Service: Orthopedics;  Laterality: Left;    Family History  Problem Relation Age of Onset   Heart disease Mother    Stroke Mother    Suicidality Father    Cancer Sister        breast    Social History   Tobacco Use   Smoking status: Never   Smokeless tobacco: Never  Vaping Use   Vaping Use: Never used  Substance Use Topics   Alcohol use: Yes    Alcohol/week: 0.0 standard drinks    Comment: 10/31/2016 "wine a couple times/month"   Drug use: No     No Known Allergies  Health Maintenance  Topic Date Due   COVID-19 Vaccine (4 - Booster for Pfizer series) 02/24/2022 (Originally 08/25/2020)   TETANUS/TDAP  04/25/2022 (Originally 02/07/2021)   Zoster Vaccines- Shingrix (1 of 2)  05/11/2022 (Originally 12/22/2002)   Pneumonia Vaccine 10+ Years old (1 - PCV) 08/08/2022 (Originally 12/21/2017)   INFLUENZA VACCINE  04/10/2022   COLONOSCOPY (Pts 45-57yrs Insurance coverage will need to be confirmed)  04/24/2023   Hepatitis C Screening  Completed   HPV VACCINES  Aged Out    Chart Review Today: I personally reviewed active problem list, medication list, allergies, family history, social history, health maintenance, notes from last encounter, lab results, imaging with the patient/caregiver today.   Review of Systems  Constitutional: Negative.   HENT: Negative.    Eyes: Negative.   Respiratory: Negative.    Cardiovascular: Negative.   Gastrointestinal: Negative.   Endocrine: Negative.   Genitourinary: Negative.   Musculoskeletal: Negative.   Skin: Negative.   Allergic/Immunologic: Negative.   Neurological: Negative.   Hematological: Negative.   Psychiatric/Behavioral: Negative.    All other systems reviewed and are negative.   Objective:   Vitals:   02/08/22 1443  BP: 128/72  Pulse: 75  Resp: 16  Temp: 98.4 F (36.9 C)  TempSrc: Oral  SpO2: 96%  Weight: 288 lb 11.2 oz (131 kg)  Height: 6\' 2"  (1.88 m)    Body mass index is 37.07 kg/m.  Physical Exam Vitals and nursing note reviewed.  Constitutional:      General: He is not in acute distress.    Appearance: Normal appearance. He is well-developed. He is obese. He is not ill-appearing, toxic-appearing or diaphoretic.  HENT:     Head: Normocephalic and atraumatic.     Jaw: No trismus.     Right Ear: External ear normal.     Left Ear: External ear normal.     Nose: Nose normal. No mucosal edema.     Right Sinus: No maxillary sinus tenderness or frontal sinus tenderness.     Left Sinus: No maxillary sinus tenderness or frontal sinus tenderness.     Mouth/Throat:     Mouth: Mucous membranes are moist.     Pharynx: Oropharynx is clear. Uvula midline. No oropharyngeal exudate, posterior  oropharyngeal erythema or uvula swelling.  Eyes:     General: Lids are normal. No scleral icterus.       Right eye: No discharge.        Left eye: No discharge.     Conjunctiva/sclera: Conjunctivae normal.  Neck:     Trachea: Trachea and phonation normal. No tracheal deviation.  Cardiovascular:     Rate and Rhythm: Normal rate and regular rhythm.     Pulses: Normal pulses.          Radial pulses are 2+ on the  right side and 2+ on the left side.       Posterior tibial pulses are 2+ on the right side and 2+ on the left side.     Heart sounds: Normal heart sounds. No murmur heard.   No friction rub. No gallop.  Pulmonary:     Effort: Pulmonary effort is normal.     Breath sounds: Normal breath sounds. No wheezing, rhonchi or rales.  Abdominal:     General: Bowel sounds are normal. There is no distension.     Palpations: Abdomen is soft.     Tenderness: There is no abdominal tenderness. There is no guarding or rebound.  Musculoskeletal:     Cervical back: Normal range of motion.     Right lower leg: No edema.     Left lower leg: No edema.  Skin:    General: Skin is warm and dry.     Findings: No rash.  Neurological:     Mental Status: He is alert. Mental status is at baseline.     Gait: Gait normal.  Psychiatric:        Mood and Affect: Mood normal.        Speech: Speech normal.        Behavior: Behavior normal.        Assessment & Plan:   Problem List Items Addressed This Visit       Cardiovascular and Mediastinum   Benign essential HTN - Primary (Chronic)    BP at goal today BP Readings from Last 3 Encounters:  02/08/22 128/72  08/08/21 132/64  08/02/21 (!) 154/87   On norvasc and telmisartan-HCTZ, also working on exercising/walking efforts, not working much on diet efforts       Relevant Medications   amLODipine (NORVASC) 5 MG tablet   telmisartan-hydrochlorothiazide (MICARDIS HCT) 80-25 MG tablet     Respiratory   OSA on CPAP    Good compliance, sleeps  well, feels rested         Other   Hyperlipidemia (Chronic)    Urged pt again to reconsider statin due to high cholesterol and high ASCVD risk  He wants to improve his cholesterol numbers on his own, but I have explained how statins work both to improve lipid panel but MORESO to reduce the risk of MI/stroke Handout regarding statins and indication for statins reviewed and given today Reviewed his ASCVD 10 year risk and modifiable risk factors   The 10-year ASCVD risk score (Arnett DK, et al., 2019) is: 19.8%   Values used to calculate the score:     Age: 61 years     Sex: Male     Is Non-Hispanic African American: Yes     Diabetic: No     Tobacco smoker: No     Systolic Blood Pressure: 128 mmHg     Is BP treated: Yes     HDL Cholesterol: 38 mg/dL     Total Cholesterol: 197 mg/dL      Relevant Medications   amLODipine (NORVASC) 5 MG tablet   telmisartan-hydrochlorothiazide (MICARDIS HCT) 80-25 MG tablet   Class 2 severe obesity with serious comorbidity and body mass index (BMI) of 37.0 to 37.9 in adult (HCC)    Pt has increased physical activity Weight unchanged but clothes are fitting better Encouraged him to use weight and other body measurements and to observe his calorie intake and work on calorie deficit and cutting out high calorie poor nutrient foods and adding in low calorie high nutrient  foods  Wt Readings from Last 5 Encounters:  02/08/22 288 lb 11.2 oz (131 kg)  08/08/21 281 lb 11.2 oz (127.8 kg)  08/02/21 283 lb (128.4 kg)  04/25/21 283 lb 8 oz (128.6 kg)  01/31/21 287 lb 1.6 oz (130.2 kg)   BMI Readings from Last 5 Encounters:  02/08/22 37.07 kg/m  08/08/21 36.17 kg/m  08/02/21 36.34 kg/m  04/25/21 36.40 kg/m  01/31/21 36.86 kg/m   comorbidities of HTN, HLD, OSA       Other Visit Diagnoses     Lower urinary tract symptoms (LUTS)       on cialis daily per urology, sx well controlled        Return in about 6 months (around 08/10/2022).    Danelle BerryLeisa Serenity Batley, PA-C 02/08/22 2:56 PM

## 2022-02-08 NOTE — Patient Instructions (Signed)
Please consider a statin trial   Recommendations on cholesterol and starting statins.    There is a benefit from LDL-C (bad cholesterol) lowering with statin therapy at virtually all levels of cardiovascular risk.  If statin therapy had no side effects and caused no financial burden, it might be reasonable to recommend it to virtually all at-risk individuals, similar to a healthy diet and exercise  It is this good of a medication!!  It reduces risk in almost everyone.   There are possible side effects with ALL medications and with statins there is a small subset of the population who may not metabolize it well, which causing muscle aches as a side effect (~5%).  We monitor for this, can test for this, and usually are careful to work with you to get a medication that gives you the benefits with minimal side effects.  Some people are sensitive to medications in general and we try to use the highest dose tolerated to give the most benefit.   American Heart Association/American College of Cardiology cholesterol and statin guidelines are as follows: In adults 76 to 69 years of age without diabetes mellitus and with LDL-C levels ?70, at a 10-year atherosclerotic cardiovascular disease risk of ?7.5 percent, start a moderate-intensity statin if a discussion of treatment options favors statin therapy  If LDL is >160, statins are indicated.  Patients with other significant risk factors would also benefit from statin.  Some of these factors include a family history of premature cardiovascular disease, chronic kidney disease, or chronic inflammatory disorder (such as chronic human immunodeficiency viral infection).   Can get more information at the following website:  [employer]Program.fi    The 10-year ASCVD risk score (Arnett DK, et al., 2019) is: 19.8%   Values used to calculate the score:     Age: 71 years     Sex: Male     Is  Non-Hispanic African American: Yes     Diabetic: No     Tobacco smoker: No     Systolic Blood Pressure: 128 mmHg     Is BP treated: Yes     HDL Cholesterol: 38 mg/dL     Total Cholesterol: 197 mg/dL

## 2022-02-13 NOTE — Assessment & Plan Note (Signed)
BP at goal today BP Readings from Last 3 Encounters:  02/08/22 128/72  08/08/21 132/64  08/02/21 (!) 154/87   On norvasc and telmisartan-HCTZ, also working on exercising/walking efforts, not working much on diet efforts

## 2022-02-13 NOTE — Assessment & Plan Note (Signed)
Urged pt again to reconsider statin due to high cholesterol and high ASCVD risk  He wants to improve his cholesterol numbers on his own, but I have explained how statins work both to improve lipid panel but MORESO to reduce the risk of MI/stroke Handout regarding statins and indication for statins reviewed and given today Reviewed his ASCVD 10 year risk and modifiable risk factors   The 10-year ASCVD risk score (Arnett DK, et al., 2019) is: 19.8%   Values used to calculate the score:     Age: 71 years     Sex: Male     Is Non-Hispanic African American: Yes     Diabetic: No     Tobacco smoker: No     Systolic Blood Pressure: 128 mmHg     Is BP treated: Yes     HDL Cholesterol: 38 mg/dL     Total Cholesterol: 197 mg/dL

## 2022-02-13 NOTE — Assessment & Plan Note (Addendum)
Pt has increased physical activity Weight unchanged but clothes are fitting better Encouraged him to use weight and other body measurements and to observe his calorie intake and work on calorie deficit and cutting out high calorie poor nutrient foods and adding in low calorie high nutrient foods  Wt Readings from Last 5 Encounters:  02/08/22 288 lb 11.2 oz (131 kg)  08/08/21 281 lb 11.2 oz (127.8 kg)  08/02/21 283 lb (128.4 kg)  04/25/21 283 lb 8 oz (128.6 kg)  01/31/21 287 lb 1.6 oz (130.2 kg)   BMI Readings from Last 5 Encounters:  02/08/22 37.07 kg/m  08/08/21 36.17 kg/m  08/02/21 36.34 kg/m  04/25/21 36.40 kg/m  01/31/21 36.86 kg/m   comorbidities of HTN, HLD, OSA

## 2022-02-13 NOTE — Assessment & Plan Note (Signed)
Good compliance, sleeps well, feels rested

## 2022-02-19 ENCOUNTER — Telehealth: Payer: Self-pay | Admitting: Family Medicine

## 2022-02-19 NOTE — Telephone Encounter (Signed)
Copied from [place] [id number]. Topic: Medicare AWV >> Feb 19, 2022  2:17 PM Jae Dire wrote: Reason for CRM:  Left message for patient to call back and schedule Medicare Annual Wellness Visit (AWV) in office.   If unable to come into the office for AWV,  please offer to do virtually or by telephone.  Last AWV: 07/12/2020  Please schedule at anytime with [place].  30 minute appointment for Virtual or phone 45 minute appointment for in office or Initial virtual/phone  Any questions, please contact me at [phone]

## 2022-04-03 ENCOUNTER — Ambulatory Visit (INDEPENDENT_AMBULATORY_CARE_PROVIDER_SITE_OTHER): Payer: 59 | Admitting: Podiatry

## 2022-04-03 ENCOUNTER — Encounter: Payer: Self-pay | Admitting: Podiatry

## 2022-04-03 ENCOUNTER — Ambulatory Visit (INDEPENDENT_AMBULATORY_CARE_PROVIDER_SITE_OTHER): Payer: 59

## 2022-04-03 ENCOUNTER — Other Ambulatory Visit: Payer: Self-pay | Admitting: Podiatry

## 2022-04-03 DIAGNOSIS — M7751 Other enthesopathy of right foot: Secondary | ICD-10-CM | POA: Diagnosis not present

## 2022-04-03 DIAGNOSIS — M778 Other enthesopathies, not elsewhere classified: Secondary | ICD-10-CM

## 2022-04-03 MED ORDER — METHYLPREDNISOLONE 4 MG PO TBPK: ORAL_TABLET | ORAL | 0 refills | Status: DC

## 2022-04-03 MED ORDER — TRIAMCINOLONE ACETONIDE 40 MG/ML IJ SUSP
20.0000 mg | Freq: Once | INTRAMUSCULAR | Status: AC
  Administered 2022-04-03: 20 mg

## 2022-04-03 MED ORDER — MELOXICAM 15 MG PO TABS: 15.0000 mg | ORAL_TABLET | Freq: Every day | ORAL | 3 refills | Status: DC

## 2022-04-03 NOTE — Progress Notes (Signed)
Subjective:  Patient ID: Warren [name], male    DOB: 1951/06/29,  MRN: [id number] HPI Chief Complaint  Patient presents with   Ankle Pain    Anterior ankle right - aching x several months, no injury, sharp pain when foot flexes upward, throbbing at night, tried voltaren gel   New Patient (Initial Visit)    Est pt 2018    71 y.o. male presents with the above complaint.   ROS: Denies fever chills nausea vomiting muscle aches pains calf pain back pain chest pain shortness of breath.  Past Medical History:  Diagnosis Date   Elevated PSA 06/07/2017   6.2 -- refer to urologist Sept 2018   Erectile dysfunction 02/27/2016   Hypercholesterolemia without hypertriglyceridemia 10/08/2007   Hypertension    Malignant hyperthermia 1986   "told I had maybe a mild case of imalignant hyperthermia at [facility]"   Obesity 02/24/2015   OSA on CPAP 02/24/2015   CPAP q night    Osteoarthritis    "feet and knees"  (10/31/2016)   PIN (prostatic intraepithelial neoplasia) 09/05/2017   Prostate biopsy Nov 2018   Past Surgical History:  Procedure Laterality Date   HAMMER TOE SURGERY Bilateral 11/2015 - 01/2016   "right-left"   JOINT REPLACEMENT     SOFT TISSUE BIOPSY Right ~ 1986   "@ [facility]"   TOTAL KNEE ARTHROPLASTY Right 08/22/2016   Procedure: TOTAL KNEE ARTHROPLASTY;  Surgeon: Loreta Ave, MD;  Location: [facility] OR;  Service: Orthopedics;  Laterality: Right;   TOTAL KNEE ARTHROPLASTY Left 10/31/2016   TOTAL KNEE ARTHROPLASTY Left 10/31/2016   Procedure: TOTAL KNEE ARTHROPLASTY;  Surgeon: Loreta Ave, MD;  Location: [facility] OR;  Service: Orthopedics;  Laterality: Left;    Current Outpatient Medications:    meloxicam (MOBIC) 15 MG tablet, Take 1 tablet (15 mg total) by mouth daily., Disp: 30 tablet, Rfl: 3   methylPREDNISolone (MEDROL DOSEPAK) 4 MG TBPK tablet, 6 day dose pack - take as directed, Disp: 21 tablet, Rfl: 0   amLODipine (NORVASC) 5 MG tablet, TAKE 1.5 TABLETS BY MOUTH EVERY DAY, Disp: 135  tablet, Rfl: 1   aspirin EC 81 MG tablet, Take 1 tablet (81 mg total) by mouth daily., Disp: , Rfl:    Omega-3 Fatty Acids (FISH OIL) 1200 MG CAPS, Take 1,200 mg by mouth., Disp: , Rfl:    tadalafil (CIALIS) 5 MG tablet, Take 1 tablet (5 mg total) by mouth daily., Disp: 90 tablet, Rfl: 3   telmisartan-hydrochlorothiazide (MICARDIS HCT) 80-25 MG tablet, TAKE 1 TABLET BY MOUTH EVERY DAY FOR BLOOD PRESSURE, Disp: 90 tablet, Rfl: 1  No Known Allergies Review of Systems Objective:  There were no vitals filed for this visit.  General: Well developed, nourished, in no acute distress, alert and oriented x3   Dermatological: Skin is warm, dry and supple bilateral. Nails x 10 are well maintained; remaining integument appears unremarkable at this time. There are no open sores, no preulcerative lesions, no rash or signs of infection present.  Vascular: Dorsalis Pedis artery and Posterior Tibial artery pedal pulses are 2/4 bilateral with immedate capillary fill time. Pedal hair growth present. No varicosities and no lower extremity edema present bilateral.   Neruologic: Grossly intact via light touch bilateral. Vibratory intact via tuning fork bilateral. Protective threshold with Semmes Wienstein monofilament intact to all pedal sites bilateral. Patellar and Achilles deep tendon reflexes 2+ bilateral. No Babinski or clonus noted bilateral.   Musculoskeletal: No gross boney pedal deformities bilateral. No pain, crepitus,  or limitation noted with foot and ankle range of motion bilateral. Muscular strength 5/5 in all groups tested bilateral.  He is limited in dorsiflexion and plantarflexion there is crepitation on range of motion.  Pain on palpation to the anterior ankle  Gait: Unassisted, Nonantalgic.    Radiographs:  Radiographs of the ankle taken today demonstrate AP medial oblique and lateral views demonstrating high degree talar tilt bone to bone contact along the medial shoulder there is anterior  spurring and the majority of his bone-on-bone contact appears to be anteriorly.  Assessment & Plan:   Assessment: Capsulitis osteoarthritis right ankle.  Plan: After sterile Betadine skin prep I injected the area today 20 mg Kenalog 5 mg Marcaine point maximal tenderness.  Started him on methylprednisolone to be followed by meloxicam.  Would like for him to follow-up in about 6 weeks with Dr. Lilian [name].     Warren [name] T. [place], [place]

## 2022-05-17 ENCOUNTER — Encounter: Payer: 59 | Admitting: Family Medicine

## 2022-05-22 ENCOUNTER — Ambulatory Visit (INDEPENDENT_AMBULATORY_CARE_PROVIDER_SITE_OTHER): Payer: 59 | Admitting: Podiatry

## 2022-05-22 DIAGNOSIS — M19071 Primary osteoarthritis, right ankle and foot: Secondary | ICD-10-CM

## 2022-05-22 NOTE — Progress Notes (Signed)
Siler presents today for follow-up of his right ankle pain.  He states that after the injection last visit the use of the methylprednisolone and the meloxicam.  His ankle is feeling better.  He states that he continues to feel that he is trying to lateralize the gait and his ankle wants to roll laterally.  States that he feels better and boots than tennis shoes.  States that he stopped the meloxicam for period of time and noted that the symptoms recurred.  So he started back on the meloxicam.  Objective: Vital signs are stable alert and oriented x3.  Pulses are palpable.  No change in physical exam.  Pain and crepitation with range of motion.  Reviewed radiographs which demonstrated severe talar tilt impinging the lateral gutter and the medial shoulder.  Assessment: Severe osteoarthritis ankle joint right.  Plan: At this point I recommended he continue his anti-inflammatories.  We will request a CT to evaluate the ankle joint for surgical intervention differential diagnosis and possible long-term alternatives to surgery.

## 2022-05-23 ENCOUNTER — Ambulatory Visit
Admission: RE | Admit: 2022-05-23 | Discharge: 2022-05-23 | Disposition: A | Discharge status: Home / Self Care | Payer: 59 | Source: Ambulatory Visit | Attending: Podiatry | Admitting: Podiatry

## 2022-05-23 DIAGNOSIS — R600 Localized edema: Secondary | ICD-10-CM | POA: Diagnosis not present

## 2022-05-23 DIAGNOSIS — M19071 Primary osteoarthritis, right ankle and foot: Secondary | ICD-10-CM

## 2022-05-30 ENCOUNTER — Encounter: Payer: Self-pay | Admitting: Urology

## 2022-07-23 ENCOUNTER — Ambulatory Visit: Payer: Self-pay | Admitting: *Deleted

## 2022-07-23 NOTE — Telephone Encounter (Signed)
Message from [place] sent at 07/23/2022  9:54 AM EST  Summary: covid +   Pt states he tested covid + today  Pt inquiring if anything can be prescribed  Please assist further          Call History   Type Contact Phone/Fax User  07/23/2022 09:53 AM EST Phone (Incoming) [place], Kalid Ghan (Self) [phone] Jerilynn Mages) Leroy Kennedy R   Reason for Disposition  [1] COVID-19 diagnosed by positive lab test (e.g., PCR, rapid self-test kit) AND [2] mild symptoms (e.g., cough, fever, others) AND [0] no complications or SOB  Answer Assessment - Initial Assessment Questions 1. COVID-19 DIAGNOSIS: "How do you know that you have COVID?" (e.g., positive lab test or self-test, diagnosed by doctor or NP/PA, symptoms after exposue).     I tested positive for Covid this morning.   I have the sniffles. 2. COVID-19 EXPOSURE: "Was there any known exposure to COVID before the symptoms began?" CDC Definition of close contact: within 6 feet (2 meters) for a total of 15 minutes or more over a 24-hour period.      Not asked 3. ONSET: "When did the COVID-19 symptoms start?"      Started sneezing and coughing yesterday.   I started Mucinex DM yesterday.    4. WORST SYMPTOM: "What is your worst symptom?" (e.g., cough, fever, shortness of breath, muscle aches)     Not asked 5. COUGH: "Do you have a cough?" If Yes, ask: "How bad is the cough?"       Yesterday a scratchy throat, not sore.    I'm coughing a little.    6. FEVER: "Do you have a fever?" If Yes, ask: "What is your temperature, how was it measured, and when did it start?"     No 7. RESPIRATORY STATUS: "Describe your breathing?" (e.g., normal; shortness of breath, wheezing, unable to speak)      Sneezing a lot.  No shortness of breath. I'm mostly blowing my nose. 8. BETTER-SAME-WORSE: "Are you getting better, staying the same or getting worse compared to yesterday?"  If getting worse, ask, "In what way?"     Not asked  9. OTHER SYMPTOMS: "Do you  have any other symptoms?"  (e.g., chills, fatigue, headache, loss of smell or taste, muscle pain, sore throat)     No change in taste or smell.   I feel fine.   No headache.   I'm sneezing more than anything.   10. HIGH RISK DISEASE: "Do you have any chronic medical problems?" (e.g., asthma, heart or lung disease, weak immune system, obesity, etc.)       No high risks.    11. VACCINE: "Have you had the COVID-19 vaccine?" If Yes, ask: "Which one, how many shots, when did you get it?"       Not asked  12. PREGNANCY: "Is there any chance you are pregnant?" "When was your last menstrual period?"       N/A 13. O2 SATURATION MONITOR:  "Do you use an oxygen saturation monitor (pulse oximeter) at home?" If Yes, ask "What is your reading (oxygen level) today?" "What is your usual oxygen saturation reading?" (e.g., 95%)       N/A  Protocols used: Coronavirus (COVID-19) Diagnosed or Suspected-A-AH

## 2022-07-23 NOTE — Telephone Encounter (Signed)
Pt said he talked with someone and that he just decided to take Mucinex and he is feeling ok just stuffy and would give the mucinex a couple days and will call and do a virtual if he is not feeling better in a few days.

## 2022-07-23 NOTE — Telephone Encounter (Signed)
  Chief Complaint: Positive for Covid as of this morning via home test. Symptoms: sniffles, scratchy throat, mild cough and sneezing a lot. Frequency: Started yesterday.    "I feel fine other than sneezing a lot and having the sniffles and blowing my nose".   He asked about the antiviral and decided he would pass on it since he doesn't have any high risk factors and "I'm feeling fine".   Went over s/s to call us back for. Pertinent Negatives: Patient denies fever, change in taste or smell, no diarrhea or vomiting Disposition: [] ED /[] Urgent Care (no appt availability in office) / [] Appointment(In office/virtual)/ []  [facility]/ [x] Home Care/ [] Refused Recommended Disposition /[] [place] [facility]/ []  Follow-up with PCP Additional Notes: I went over the care advice with him which his work had also told him the same thing pertaining to isolating.  He thanked me very much for returning his call and answering his questions.    I did forward my notes to , PA-C at [facility] for her information.

## 2022-07-27 ENCOUNTER — Other Ambulatory Visit: Payer: Self-pay | Admitting: Podiatry

## 2022-08-01 ENCOUNTER — Other Ambulatory Visit: Payer: 59

## 2022-08-01 DIAGNOSIS — R972 Elevated prostate specific antigen [PSA]: Secondary | ICD-10-CM

## 2022-08-02 LAB — PSA: Prostate Specific Ag, Serum: 8.6 ng/mL — ABNORMAL HIGH (ref 0.0–4.0)

## 2022-08-03 ENCOUNTER — Other Ambulatory Visit: Payer: 59

## 2022-08-07 ENCOUNTER — Ambulatory Visit (INDEPENDENT_AMBULATORY_CARE_PROVIDER_SITE_OTHER): Payer: 59 | Admitting: Urology

## 2022-08-07 VITALS — BP 150/85 | HR 65 | Ht 74.0 in | Wt 280.0 lb

## 2022-08-07 DIAGNOSIS — N5203 Combined arterial insufficiency and corporo-venous occlusive erectile dysfunction: Secondary | ICD-10-CM | POA: Diagnosis not present

## 2022-08-07 DIAGNOSIS — R972 Elevated prostate specific antigen [PSA]: Secondary | ICD-10-CM | POA: Diagnosis not present

## 2022-08-07 MED ORDER — TADALAFIL 5 MG PO TABS: 5.0000 mg | ORAL_TABLET | Freq: Every day | ORAL | 3 refills | Status: DC

## 2022-08-07 NOTE — Progress Notes (Signed)
08/07/2022 10:24 AM   Warren [name] [place] 11-Jul-1951 [id number]  Referring provider: Danelle Berry, PA-C [address] [place],  [place] [address]  Chief Complaint  Patient presents with   Elevated PSA    HPI: 71 year old male with a personal history of elevated PSA, ED, and additional GU issues who returns today for routine annual follow-up.  Notably, he is present she of elevated PSA left 5-6 range.  He had a negative prostate biopsy in 2018.  His most recent PSA today has jumped to 8.6.  He reports over the past year, has been doing fine.  He started a new medication about 2 months ago, meloxicam for ankle pain.  He does not know if it helped much.  He does mention that he had his PSA drawn at day 10 after he was diagnosed with COVID.  He wonders if this may be related.  He denies any urinary issues.  He occasionally will get up a few times at night to void but often if he drinks too much fluids before going to bed.  He otherwise has a good stream and empties well.  He continues to take Cialis both for erectile dysfunction as well as urinary control.   PMH: Past Medical History:  Diagnosis Date   Elevated PSA 06/07/2017   6.2 -- refer to urologist Sept 2018   Erectile dysfunction 02/27/2016   Hypercholesterolemia without hypertriglyceridemia 10/08/2007   Hypertension    Malignant hyperthermia 1986   "told I had maybe a mild case of imalignant hyperthermia at [facility]"   Obesity 02/24/2015   OSA on CPAP 02/24/2015   CPAP q night    Osteoarthritis    "feet and knees"  (10/31/2016)   PIN (prostatic intraepithelial neoplasia) 09/05/2017   Prostate biopsy Nov 2018    Surgical History: Past Surgical History:  Procedure Laterality Date   HAMMER TOE SURGERY Bilateral 11/2015 - 01/2016   "right-left"   JOINT REPLACEMENT     SOFT TISSUE BIOPSY Right ~ 1986   "@ [facility]"   TOTAL KNEE ARTHROPLASTY Right 08/22/2016   Procedure: TOTAL KNEE ARTHROPLASTY;  Surgeon: Loreta Ave,  MD;  Location: [facility] OR;  Service: Orthopedics;  Laterality: Right;   TOTAL KNEE ARTHROPLASTY Left 10/31/2016   TOTAL KNEE ARTHROPLASTY Left 10/31/2016   Procedure: TOTAL KNEE ARTHROPLASTY;  Surgeon: Loreta Ave, MD;  Location: [facility] OR;  Service: Orthopedics;  Laterality: Left;    Home Medications:  Allergies as of 08/07/2022   No Known Allergies      Medication List        Accurate as of August 07, 2022 10:24 AM. If you have any questions, ask your nurse or doctor.          STOP taking these medications    methylPREDNISolone 4 MG Tbpk tablet Commonly known as: MEDROL DOSEPAK Stopped by: Vanna Scotland, MD       TAKE these medications    amLODipine 5 MG tablet Commonly known as: NORVASC TAKE 1.5 TABLETS BY MOUTH EVERY DAY   aspirin EC 81 MG tablet Take 1 tablet (81 mg total) by mouth daily.   Fish Oil 1200 MG Caps Take 1,200 mg by mouth.   meloxicam 15 MG tablet Commonly known as: MOBIC TAKE 1 TABLET (15 MG TOTAL) BY MOUTH DAILY.   tadalafil 5 MG tablet Commonly known as: CIALIS Take 1 tablet (5 mg total) by mouth daily.   telmisartan-hydrochlorothiazide 80-25 MG tablet Commonly known as: MICARDIS HCT TAKE 1 TABLET  BY MOUTH EVERY DAY FOR BLOOD PRESSURE        Allergies: No Known Allergies  Family History: Family History  Problem Relation Age of Onset   Heart disease Mother    Stroke Mother    Suicidality Father    Cancer Sister        breast    Social History:  reports that he has never smoked. He has never used smokeless tobacco. He reports current alcohol use. He reports that he does not use drugs.   Physical Exam: BP (!) 150/85   Pulse 65   Ht 6\' 2"  (1.88 m)   Wt 280 lb (127 kg)   BMI 35.95 kg/m   Constitutional:  Alert and oriented, No acute distress. HEENT: [place] AT, moist mucus membranes.  Trachea midline, no masses. Cardiovascular: No clubbing, cyanosis, or edema. Rectal: Normal sphincter tone.  Small prostate, nontender no  nodules. Skin: No rashes, bruises or suspicious lesions. Neurologic: Grossly intact, no focal deficits, moving all 4 extremities. Psychiatric: Normal mood and affect.  Laboratory Data: Lab Results  Component Value Date   WBC 7.0 04/25/2021   HGB 15.7 04/25/2021   HCT 45.9 04/25/2021   MCV 88.6 04/25/2021   PLT 286 04/25/2021    Lab Results  Component Value Date   CREATININE 1.04 08/08/2021    Lab Results  Component Value Date   HGBA1C 5.4 04/25/2021    Assessment & Plan:    1. Elevated PSA Personal history of elevated PSA status post negative biopsy in 2018  Rectal exam remains unremarkable  He has had another increase, higher than his previous baseline  At this point, we discussed alternatives including continued serial PSA with at a closer interval, biopsy versus prostate MRI.  He is most interested in prostate MRI.  He understands that if there is a lesion in question, this may lead to prostate biopsy.  He understands this.  Also go ahead and plan to follow him little more closely with serial PSA, repeat PSA in 6 months.  He is agreeable this plan. - PSA; Future - MR PROSTATE W WO CONTRAST; Future  2. 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  02/07/2023, MD  [facility] [address], [address] [place], [place] [phone]

## 2022-08-11 ENCOUNTER — Encounter: Payer: Self-pay | Admitting: Urology

## 2022-08-13 ENCOUNTER — Ambulatory Visit (INDEPENDENT_AMBULATORY_CARE_PROVIDER_SITE_OTHER): Payer: 59 | Admitting: Physician Assistant

## 2022-08-13 ENCOUNTER — Encounter: Payer: Self-pay | Admitting: Physician Assistant

## 2022-08-13 VITALS — BP 158/82 | HR 71 | Temp 98.6°F | Resp 16 | Ht 74.0 in | Wt 296.5 lb

## 2022-08-13 DIAGNOSIS — I10 Essential (primary) hypertension: Secondary | ICD-10-CM | POA: Diagnosis not present

## 2022-08-13 DIAGNOSIS — Z6838 Body mass index (BMI) 38.0-38.9, adult: Secondary | ICD-10-CM | POA: Diagnosis not present

## 2022-08-13 DIAGNOSIS — E785 Hyperlipidemia, unspecified: Secondary | ICD-10-CM | POA: Diagnosis not present

## 2022-08-13 NOTE — Assessment & Plan Note (Addendum)
Chronic, currently not controlled with medication Patient has been trying to manage with diet and exercise  Will recheck lipid panel in about 4 weeks when he is fasting -results to dictate further recommendations for management The 10-year ASCVD risk score (Arnett DK, et al., 2019) is: 28.1%   Values used to calculate the score:     Age: 71 years     Sex: Male     Is Non-Hispanic African American: Yes     Diabetic: No     Tobacco smoker: No     Systolic Blood Pressure: 158 mmHg     Is BP treated: Yes     HDL Cholesterol: 38 mg/dL     Total Cholesterol: 197 mg/dL  Follow up in 4 weeks for labs  Recommend statin given most recent lipid panel results but will hold off on further discussion until new labs are drawn

## 2022-08-13 NOTE — Progress Notes (Signed)
Established Patient Office Visit  Name: Warren [name]   MRN: [id number]    DOB: 1951-05-29   Date:08/13/2022  Today's Provider: Jacquelin Hawking, MHS, PA-C Introduced myself to the patient as a PA-C and provided education on APPs in clinical practice.         Subjective  Chief Complaint  Chief Complaint  Patient presents with   Follow-up   Hypertension   Hyperlipidemia    HPI   HYPERTENSION / HYPERLIPIDEMIA Satisfied with current treatment? yes Duration of hypertension: years BP monitoring frequency: a few times a week BP range: seldom over 140/70s at home  BP medication side effects: no Past BP meds: amlodipine 5 mg, telmisartan-HCTZ 80-25 mg  Duration of hyperlipidemia: years Cholesterol medication side effects: no Cholesterol supplements: none Past cholesterol medications: none Medication compliance: good compliance Aspirin: yes Recent stressors: yes- had COVID last month but not much outside of this  Recurrent headaches: no Visual changes: no Palpitations: no Dyspnea: no Chest pain: no Lower extremity edema: no Dizzy/lightheaded: no  Exercise: he reports he is exercising regularly - states he is walking and plays golf frequently - typically walks the course  Diet: trying to watch his salt intake but not restricting otherwise    Patient Active Problem List   Diagnosis Date Noted   OSA on CPAP 10/31/2020   PIN (prostatic intraepithelial neoplasia) 09/05/2017   Elevated PSA 06/07/2017   Erectile dysfunction 02/27/2016   Class 2 severe obesity due to excess calories with serious comorbidity and body mass index (BMI) of 38.0 to 38.9 in adult [facility]) 02/24/2015   Allergic rhinitis 01/12/2008   Hyperlipidemia 10/08/2007   Benign essential HTN 08/13/2007    Past Surgical History:  Procedure Laterality Date   HAMMER TOE SURGERY Bilateral 11/2015 - 01/2016   "right-left"   JOINT REPLACEMENT     SOFT TISSUE BIOPSY Right ~ 1986   "@ [facility]"   TOTAL KNEE  ARTHROPLASTY Right 08/22/2016   Procedure: TOTAL KNEE ARTHROPLASTY;  Surgeon: Loreta Ave, MD;  Location: [facility] OR;  Service: Orthopedics;  Laterality: Right;   TOTAL KNEE ARTHROPLASTY Left 10/31/2016   TOTAL KNEE ARTHROPLASTY Left 10/31/2016   Procedure: TOTAL KNEE ARTHROPLASTY;  Surgeon: Loreta Ave, MD;  Location: [facility] OR;  Service: Orthopedics;  Laterality: Left;    Family History  Problem Relation Age of Onset   Heart disease Mother    Stroke Mother    Suicidality Father    Cancer Sister        breast    Social History   Tobacco Use   Smoking status: Never   Smokeless tobacco: Never  Substance Use Topics   Alcohol use: Yes    Alcohol/week: 0.0 standard drinks of alcohol    Comment: 10/31/2016 "wine a couple times/month"     Current Outpatient Medications:    amLODipine (NORVASC) 5 MG tablet, TAKE 1.5 TABLETS BY MOUTH EVERY DAY, Disp: 135 tablet, Rfl: 1   aspirin EC 81 MG tablet, Take 1 tablet (81 mg total) by mouth daily., Disp: , Rfl:    meloxicam (MOBIC) 15 MG tablet, TAKE 1 TABLET (15 MG TOTAL) BY MOUTH DAILY., Disp: 30 tablet, Rfl: 0   Omega-3 Fatty Acids (FISH OIL) 1200 MG CAPS, Take 1,200 mg by mouth., Disp: , Rfl:    tadalafil (CIALIS) 5 MG tablet, Take 1 tablet (5 mg total) by mouth daily., Disp: 90 tablet, Rfl: 3   telmisartan-hydrochlorothiazide (MICARDIS HCT) 80-25 MG  tablet, TAKE 1 TABLET BY MOUTH EVERY DAY FOR BLOOD PRESSURE, Disp: 90 tablet, Rfl: 1  No Known Allergies  I personally reviewed active problem list, medication list, allergies, health maintenance, notes from last encounter, lab results with the patient/caregiver today.   Review of Systems  Eyes:  Negative for blurred vision and double vision.  Respiratory:  Negative for shortness of breath and wheezing.   Cardiovascular:  Negative for chest pain, palpitations and leg swelling.  Musculoskeletal:  Negative for falls.  Neurological:  Negative for dizziness, focal weakness, loss of  consciousness and headaches.      Objective  Vitals:   08/13/22 1533  BP: (!) 158/82  Pulse: 71  Resp: 16  Temp: 98.6 F (37 C)  TempSrc: Oral  SpO2: 95%  Weight: 296 lb 8 oz (134.5 kg)  Height: 6\' 2"  (1.88 m)    Body mass index is 38.07 kg/m.  Physical Exam Vitals reviewed.  Constitutional:      Appearance: Normal appearance.  HENT:     Head: Normocephalic and atraumatic.  Eyes:     Extraocular Movements: Extraocular movements intact.     Conjunctiva/sclera: Conjunctivae normal.     Pupils: Pupils are equal, round, and reactive to light.  Neck:     Vascular: No carotid bruit or JVD.  Cardiovascular:     Rate and Rhythm: Normal rate and regular rhythm.     Pulses: Normal pulses.          Radial pulses are 2+ on the right side and 2+ on the left side.     Heart sounds: Normal heart sounds. No murmur heard.    No friction rub. No gallop.  Pulmonary:     Effort: Pulmonary effort is normal.     Breath sounds: Normal breath sounds. No stridor. No wheezing, rhonchi or rales.  Musculoskeletal:     Cervical back: Normal range of motion.     Right lower leg: No edema.     Left lower leg: No edema.  Skin:    General: Skin is warm.  Neurological:     General: No focal deficit present.     Mental Status: He is alert and oriented to person, place, and time.  Psychiatric:        Attention and Perception: Attention and perception normal.        Mood and Affect: Mood and affect normal.        Speech: Speech normal.        Behavior: Behavior normal. Behavior is cooperative.        Thought Content: Thought content normal.        Judgment: Judgment normal.      Recent Results (from the past 2160 hour(s))  PSA     Status: Abnormal   Collection Time: 08/01/22  8:16 AM  Result Value Ref Range   Prostate Specific Ag, Serum 8.6 (H) 0.0 - 4.0 ng/mL    Comment: Roche ECLIA methodology. According to the American Urological Association, Serum PSA should decrease and remain  at undetectable levels after radical prostatectomy. The AUA defines biochemical recurrence as an initial PSA value 0.2 ng/mL or greater followed by a subsequent confirmatory PSA value 0.2 ng/mL or greater. Values obtained with different assay methods or kits cannot be used interchangeably. Results cannot be interpreted as absolute evidence of the presence or absence of malignant disease.      PHQ2/9:    08/13/2022    3:28 PM 02/08/2022    2:46 PM  08/08/2021    8:23 AM 04/25/2021    1:29 PM 01/31/2021    3:42 PM  Depression screen PHQ 2/9  Decreased Interest 0 0 0 0 0  Down, Depressed, Hopeless 0 0 0 0 0  PHQ - 2 Score 0 0 0 0 0  Altered sleeping 0 0 0    Tired, decreased energy 0 0 0    Change in appetite 0 0 0    Feeling bad or failure about yourself  0 0 0    Trouble concentrating 0 0 0    Moving slowly or fidgety/restless 0 0 0    Suicidal thoughts 0 0 0    PHQ-9 Score 0 0 0    Difficult doing work/chores Not difficult at all  Not difficult at all        Fall Risk:    08/13/2022    3:28 PM 02/08/2022    2:46 PM 08/08/2021    8:23 AM 04/25/2021    1:29 PM 01/31/2021    3:41 PM  Fall Risk   Falls in the past year? 0 0 0 0 0  Number falls in past yr: 0  0 0 0  Injury with Fall? 0  0 0 0  Risk for fall due to : No Fall Risks No Fall Risks No Fall Risks  No Fall Risks  Follow up Falls prevention discussed;Falls evaluation completed;Education provided Falls prevention discussed Falls prevention discussed Falls evaluation completed Falls evaluation completed      Functional Status Survey: Is the patient deaf or have difficulty hearing?: No Does the patient have difficulty seeing, even when wearing glasses/contacts?: No Does the patient have difficulty concentrating, remembering, or making decisions?: No Does the patient have difficulty walking or climbing stairs?: No Does the patient have difficulty dressing or bathing?: No Does the patient have difficulty doing errands  alone such as visiting a doctor's office or shopping?: No    Assessment & Plan  Problem List Items Addressed This Visit       Cardiovascular and Mediastinum   Benign essential HTN - Primary (Chronic)    Chronic, ongoing Patient reports BP measures at home are typically in 130s/70s and has agreed to bring records of daily BP measures to follow up in 4 weeks Recommend he continue with exercise and dietary measures Continue current regimen comprised of Amlodipine 5 mg PO QD, Telmisartan-HCTZ 80-25 mg PO QD at this time Follow up in 4 weeks to review BP logs - if elevated at home, may need to increase Amlodipine to 10 mg po QD        Relevant Orders   COMPLETE METABOLIC PANEL WITH GFR   CBC w/Diff/Platelet     Other   Hyperlipidemia (Chronic)    Chronic, currently not controlled with medication Patient has been trying to manage with diet and exercise  Will recheck lipid panel in about 4 weeks when he is fasting -results to dictate further recommendations for management The 10-year ASCVD risk score (Arnett DK, et al., 2019) is: 28.1%   Values used to calculate the score:     Age: 71 years     Sex: Male     Is Non-Hispanic African American: Yes     Diabetic: No     Tobacco smoker: No     Systolic Blood Pressure: 158 mmHg     Is BP treated: Yes     HDL Cholesterol: 38 mg/dL     Total Cholesterol: 197 mg/dL  Follow up  in 4 weeks for labs  Recommend statin given most recent lipid panel results but will hold off on further discussion until new labs are drawn       Relevant Orders   Lipid Profile   Class 2 severe obesity due to excess calories with serious comorbidity and body mass index (BMI) of 38.0 to 38.9 in adult [facility])    Chronic,ongoing Patient reports he is trying to be more active and is walking a lot Recommend he continue exercise and dietary efforts for obesity, HTN, and HLD control Will check A1c and Lipid panel in about 4 weeks to assess response to these measures  as he was not fasting today Follow up in 4 weeks for labs        Relevant Orders   Lipid Profile   HgB A1c     Return in about 3 months (around 11/12/2022) for AWV.   I, Seith Aikey E Algernon Mundie, PA-C, have reviewed all documentation for this visit. The documentation on 08/13/22 for the exam, diagnosis, procedures, and orders are all accurate and complete.   Jacquelin Hawking, MHS, PA-C [facility]

## 2022-08-13 NOTE — Assessment & Plan Note (Signed)
Chronic,ongoing Patient reports he is trying to be more active and is walking a lot Recommend he continue exercise and dietary efforts for obesity, HTN, and HLD control Will check A1c and Lipid panel in about 4 weeks to assess response to these measures as he was not fasting today Follow up in 4 weeks for labs

## 2022-08-13 NOTE — Assessment & Plan Note (Signed)
Chronic, ongoing Patient reports BP measures at home are typically in 130s/70s and has agreed to bring records of daily BP measures to follow up in 4 weeks Recommend he continue with exercise and dietary measures Continue current regimen comprised of Amlodipine 5 mg PO QD, Telmisartan-HCTZ 80-25 mg PO QD at this time Follow up in 4 weeks to review BP logs - if elevated at home, may need to increase Amlodipine to 10 mg po QD

## 2022-08-13 NOTE — Patient Instructions (Addendum)
Your blood pressure was mildly elevated today.  If possible please take it at home using an electronic blood pressure cuff for the upper arm Record your blood pressure once per day and bring them back with you to your apt so we can make sure you are not developing high blood pressure.   Incorporating a minimum of 150 minutes (20-30 minutes per day) of moderate intensity physical activity can help improve your heart health and reduce the chances of high blood pressure and other cardiovascular risks. Incorporating a heart healthy diet can also help reduce the chances of heart attack and high cholesterol.   Please come back to complete your labs at your convenience in the next 4 weeks - these will need to be done while fasting, nothing to eat or drink other than water for at least 8 hours prior to the lab draw.   It was nice to meet you and I appreciate the opportunity to be involved in your care If you were satisfied with the care you received from me, I would greatly appreciate you saying so in the after-visit survey that is sent out following our visit.

## 2022-08-22 ENCOUNTER — Ambulatory Visit
Admission: RE | Admit: 2022-08-22 | Discharge: 2022-08-22 | Disposition: A | Discharge status: Home / Self Care | Payer: 59 | Source: Ambulatory Visit | Attending: Urology | Admitting: Urology

## 2022-08-22 DIAGNOSIS — R972 Elevated prostate specific antigen [PSA]: Secondary | ICD-10-CM | POA: Diagnosis not present

## 2022-08-22 DIAGNOSIS — N401 Enlarged prostate with lower urinary tract symptoms: Secondary | ICD-10-CM | POA: Diagnosis not present

## 2022-08-22 MED ORDER — GADOBUTROL 1 MMOL/ML IV SOLN
10.0000 mL | Freq: Once | INTRAVENOUS | Status: AC | PRN
  Administered 2022-08-22: 10 mL via INTRAVENOUS

## 2022-08-23 ENCOUNTER — Other Ambulatory Visit: Payer: Self-pay | Admitting: Podiatry

## 2022-09-09 ENCOUNTER — Other Ambulatory Visit: Payer: Self-pay | Admitting: Family Medicine

## 2022-09-09 DIAGNOSIS — I10 Essential (primary) hypertension: Secondary | ICD-10-CM

## 2022-09-11 ENCOUNTER — Other Ambulatory Visit: Payer: Self-pay | Admitting: Family Medicine

## 2022-09-11 DIAGNOSIS — I10 Essential (primary) hypertension: Secondary | ICD-10-CM

## 2022-09-11 NOTE — Telephone Encounter (Signed)
Requested medication (s) are due for refill today:   Yes  Requested medication (s) are on the active medication list:   Yes  Future visit scheduled:   Not sure   there is an appt. Listed but does not say with who.   Last ordered: 6/12023 #90, 1 refill  Returned because labs are due per protocol   Requested Prescriptions  Pending Prescriptions Disp Refills   telmisartan-hydrochlorothiazide (MICARDIS HCT) 80-25 MG tablet [Pharmacy Med Name: TELMISARTAN-HCTZ 80-25 MG TAB] 90 tablet 1    Sig: TAKE 1 TABLET BY MOUTH EVERY DAY FOR BLOOD PRESSURE     Cardiovascular: ARB + Diuretic Combos Failed - 09/09/2022  9:03 AM      Failed - K in normal range and within 180 days    Potassium  Date Value Ref Range Status  08/08/2021 3.6 3.5 - 5.3 mmol/L Final         Failed - Na in normal range and within 180 days    Sodium  Date Value Ref Range Status  08/08/2021 141 135 - 146 mmol/L Final  01/28/2018 140 134 - 144 mmol/L Final         Failed - Cr in normal range and within 180 days    Creat  Date Value Ref Range Status  08/08/2021 1.04 0.70 - 1.35 mg/dL Final         Failed - eGFR is 10 or above and within 180 days    GFR, Est African American  Date Value Ref Range Status  12/24/2019 83 > OR = 60 mL/min/1.42m Final   GFR, Est Non African American  Date Value Ref Range Status  12/24/2019 71 > OR = 60 mL/min/1.777mFinal   eGFR  Date Value Ref Range Status  08/08/2021 78 > OR = 60 mL/min/1.7345minal    Comment:    The eGFR is based on the CKD-EPI 2021 equation. To calculate  the new eGFR from a previous Creatinine or Cystatin C result, go to https://www.kidney.org/professionals/ kdoqi/gfr%5Fcalculator          Failed - Last BP in normal range    BP Readings from Last 1 Encounters:  08/13/22 (!) 158/82         Passed - Patient is not pregnant      Passed - Valid encounter within last 6 months    Recent Outpatient Visits           4 weeks ago Benign essential HTN    [facility]   7 months [facility] essential HT[facility]   1 year ago [facility]ign essential HTN   [facility]   1 year ago [facility]inary frequency   [facility]   1 year [place] Elevated PSA   [facility]       Future Appointments             [facility] 2 [facility] [facility] [place]

## 2022-09-20 ENCOUNTER — Ambulatory Visit: Payer: 59

## 2022-09-30 ENCOUNTER — Other Ambulatory Visit: Payer: Self-pay | Admitting: Podiatry

## 2022-10-29 ENCOUNTER — Encounter: Payer: Self-pay | Admitting: Podiatry

## 2022-11-04 ENCOUNTER — Other Ambulatory Visit: Payer: Self-pay | Admitting: Podiatry

## 2022-11-22 ENCOUNTER — Ambulatory Visit (INDEPENDENT_AMBULATORY_CARE_PROVIDER_SITE_OTHER): Payer: 59

## 2022-11-22 VITALS — Ht 74.0 in | Wt 283.0 lb

## 2022-11-22 DIAGNOSIS — Z0000 Encounter for general adult medical examination without abnormal findings: Secondary | ICD-10-CM

## 2022-11-22 DIAGNOSIS — Z1211 Encounter for screening for malignant neoplasm of colon: Secondary | ICD-10-CM

## 2022-11-22 NOTE — Patient Instructions (Signed)
[name] , Thank you for taking time to come for your Medicare Wellness Visit. I appreciate your ongoing commitment to your health goals. Please review the following plan we discussed and let me know if I can assist you in the future.   These are the goals we discussed:  Goals      Weight (lb) < 270 lb (122.5 kg)     Pt would like to lose weight and eat healthier over the next year in order to keep cholesterol down and stay off statin drugs.         This is a list of the screening recommended for you and due dates:  Health Maintenance  Topic Date Due   Zoster (Shingles) Vaccine (1 of 2) Never done   COVID-19 Vaccine (4 - 2023-24 season) 05/11/2022   Flu Shot  12/09/2022*   Pneumonia Vaccine (1 of 1 - PCV) 08/14/2023*   Colon Cancer Screening  04/24/2023   Medicare Annual Wellness Visit  11/22/2023   Hepatitis C Screening: USPSTF Recommendation to screen - Ages 18-79 yo.  Completed   HPV Vaccine  Aged Out   DTaP/Tdap/Td vaccine  Discontinued  *Topic was postponed. The date shown is not the original due date.    Advanced directives: no mailed paperwork  Conditions/risks identified: none  Next appointment: Follow up in one year for your annual wellness visit. 11/28/2023 '@8'$ :15am telephone  Preventive Care 70 Years and Older, Male  Preventive care refers to lifestyle choices and visits with your health care provider that can promote health and wellness. What does preventive care include? A yearly physical exam. This is also called an annual well check. Dental exams once or twice a year. Routine eye exams. Ask your health care provider how often you should have your eyes checked. Personal lifestyle choices, including: Daily care of your teeth and gums. Regular physical activity. Eating a healthy diet. Avoiding tobacco and drug use. Limiting alcohol use. Practicing safe sex. Taking low doses of aspirin every day. Taking vitamin and mineral supplements as recommended by  your health care provider. What happens during an annual well check? The services and screenings done by your health care provider during your annual well check will depend on your age, overall health, lifestyle risk factors, and family history of disease. Counseling  Your health care provider may ask you questions about your: Alcohol use. Tobacco use. Drug use. Emotional well-being. Home and relationship well-being. Sexual activity. Eating habits. History of falls. Memory and ability to understand (cognition). Work and work Statistician. Screening  You may have the following tests or measurements: Height, weight, and BMI. Blood pressure. Lipid and cholesterol levels. These may be checked every 5 years, or more frequently if you are over 20 years old. Skin check. Lung cancer screening. You may have this screening every year starting at age 65 if you have a 30-pack-year history of smoking and currently smoke or have quit within the past 15 years. Fecal occult blood test (FOBT) of the stool. You may have this test every year starting at age 70. Flexible sigmoidoscopy or colonoscopy. You may have a sigmoidoscopy every 5 years or a colonoscopy every 10 years starting at age 19. Prostate cancer screening. Recommendations will vary depending on your family history and other risks. Hepatitis C blood test. Hepatitis B blood test. Sexually transmitted disease (STD) testing. Diabetes screening. This is done by checking your blood sugar (glucose) after you have not eaten for a while (fasting). You may have this done  every 1-3 years. Abdominal aortic aneurysm (AAA) screening. You may need this if you are a current or former smoker. Osteoporosis. You may be screened starting at age 34 if you are at high risk. Talk with your health care provider about your test results, treatment options, and if necessary, the need for more tests. Vaccines  Your health care provider may recommend certain vaccines,  such as: Influenza vaccine. This is recommended every year. Tetanus, diphtheria, and acellular pertussis (Tdap, Td) vaccine. You may need a Td booster every 10 years. Zoster vaccine. You may need this after age 70. Pneumococcal 13-valent conjugate (PCV13) vaccine. One dose is recommended after age 25. Pneumococcal polysaccharide (PPSV23) vaccine. One dose is recommended after age 20. Talk to your health care provider about which screenings and vaccines you need and how often you need them. This information is not intended to replace advice given to you by your health care provider. Make sure you discuss any questions you have with your health care provider. Document Released: 09/23/2015 Document Revised: 05/16/2016 Document Reviewed: 06/28/2015 Elsevier Interactive Patient Education  2017 [employer] Prevention in the Home Falls can cause injuries. They can happen to people of all ages. There are many things you can do to make your home safe and to help prevent falls. What can I do on the outside of my home? Regularly fix the edges of walkways and driveways and fix any cracks. Remove anything that might make you trip as you walk through a door, such as a raised step or threshold. Trim any bushes or trees on the path to your home. Use bright outdoor lighting. Clear any walking paths of anything that might make someone trip, such as rocks or tools. Regularly check to see if handrails are loose or broken. Make sure that both sides of any steps have handrails. Any raised decks and porches should have guardrails on the edges. Have any leaves, snow, or ice cleared regularly. Use sand or salt on walking paths during winter. Clean up any spills in your garage right away. This includes oil or grease spills. What can I do in the bathroom? Use night lights. Install grab bars by the toilet and in the tub and shower. Do not use towel bars as grab bars. Use non-skid mats or decals in the tub or  shower. If you need to sit down in the shower, use a plastic, non-slip stool. Keep the floor dry. Clean up any water that spills on the floor as soon as it happens. Remove soap buildup in the tub or shower regularly. Attach bath mats securely with double-sided non-slip rug tape. Do not have throw rugs and other things on the floor that can make you trip. What can I do in the bedroom? Use night lights. Make sure that you have a light by your bed that is easy to reach. Do not use any sheets or blankets that are too big for your bed. They should not hang down onto the floor. Have a firm chair that has side arms. You can use this for support while you get dressed. Do not have throw rugs and other things on the floor that can make you trip. What can I do in the kitchen? Clean up any spills right away. Avoid walking on wet floors. Keep items that you use a lot in easy-to-reach places. If you need to reach something above you, use a strong step stool that has a grab bar. Keep electrical cords out of the  way. Do not use floor polish or wax that makes floors slippery. If you must use wax, use non-skid floor wax. Do not have throw rugs and other things on the floor that can make you trip. What can I do with my stairs? Do not leave any items on the stairs. Make sure that there are handrails on both sides of the stairs and use them. Fix handrails that are broken or loose. Make sure that handrails are as long as the stairways. Check any carpeting to make sure that it is firmly attached to the stairs. Fix any carpet that is loose or worn. Avoid having throw rugs at the top or bottom of the stairs. If you do have throw rugs, attach them to the floor with carpet tape. Make sure that you have a light switch at the top of the stairs and the bottom of the stairs. If you do not have them, ask someone to add them for you. What else can I do to help prevent falls? Wear shoes that: Do not have high heels. Have  rubber bottoms. Are comfortable and fit you well. Are closed at the toe. Do not wear sandals. If you use a stepladder: Make sure that it is fully opened. Do not climb a closed stepladder. Make sure that both sides of the stepladder are locked into place. Ask someone to hold it for you, if possible. Clearly mark and make sure that you can see: Any grab bars or handrails. First and last steps. Where the edge of each step is. Use tools that help you move around (mobility aids) if they are needed. These include: Canes. Walkers. Scooters. Crutches. Turn on the lights when you go into a dark area. Replace any light bulbs as soon as they burn out. Set up your furniture so you have a clear path. Avoid moving your furniture around. If any of your floors are uneven, fix them. If there are any pets around you, be aware of where they are. Review your medicines with your doctor. Some medicines can make you feel dizzy. This can increase your chance of falling. Ask your doctor what other things that you can do to help prevent falls. This information is not intended to replace advice given to you by your health care provider. Make sure you discuss any questions you have with your health care provider. Document Released: 06/23/2009 Document Revised: 02/02/2016 Document Reviewed: 10/01/2014 Elsevier Interactive Patient Education  2017 [employer].

## 2022-11-22 NOTE — Progress Notes (Signed)
I connected with  Warren [name] on 11/22/22 by a audio enabled telemedicine application and verified that I am speaking with the correct person using two identifiers.  Patient Location: Home  Provider Location: Office/Clinic  I discussed the limitations of evaluation and management by telemedicine. The patient expressed understanding and agreed to proceed.  Subjective:   Warren [name] is a 72 y.o. male who presents for Medicare Annual/Subsequent preventive examination.  Review of Systems    Cardiac Risk Factors include: advanced age (>40mn, >>75women);dyslipidemia;hypertension;male gender;obesity (BMI >30kg/m2)    Objective:    Today's Vitals   11/22/22 0812  Weight: 283 lb (128.4 kg)  Height: '6\' 2"'$  (1.88 m)   Body mass index is 36.34 kg/m.     11/22/2022    8:22 AM 07/12/2020    9:41 AM 07/09/2019    9:28 AM 07/03/2017   10:35 AM 06/06/2017    9:43 AM 10/31/2016    2:02 PM 10/30/2016   10:08 AM  Advanced Directives  Does Patient Have a Medical Advance Directive? No No No No No No No  Would patient like information on creating a medical advance directive?  Yes (MAU/Ambulatory/Procedural Areas - Information given) No - Patient declined   No - Patient declined     Current Medications (verified) Outpatient Encounter Medications as of 11/22/2022  Medication Sig   amLODipine (NORVASC) 5 MG tablet TAKE 1 AND 1/2 TABLETS BY MOUTH EVERY DAY   aspirin EC 81 MG tablet Take 1 tablet (81 mg total) by mouth daily.   meloxicam (MOBIC) 15 MG tablet TAKE 1 TABLET (15 MG TOTAL) BY MOUTH DAILY.   Omega-3 Fatty Acids (FISH OIL) 1200 MG CAPS Take 1,200 mg by mouth.   tadalafil (CIALIS) 5 MG tablet Take 1 tablet (5 mg total) by mouth daily.   telmisartan-hydrochlorothiazide (MICARDIS HCT) 80-25 MG tablet TAKE 1 TABLET BY MOUTH EVERY DAY FOR BLOOD PRESSURE   No facility-administered encounter medications on file as of 11/22/2022.    Allergies (verified) Patient has no known allergies.    History: Past Medical History:  Diagnosis Date   Elevated PSA 06/07/2017   6.2 -- refer to urologist Sept 2018   Erectile dysfunction 02/27/2016   Hypercholesterolemia without hypertriglyceridemia 10/08/2007   Hypertension    Malignant hyperthermia 1986   [place]   Obesity 02/24/2015   OSA on CPAP 02/24/2015   CPAP q night    Osteoarthritis    "feet and knees"  (10/31/2016)   PIN (prostatic intraepithelial neoplasia) 09/05/2017   Prostate biopsy Nov 2018   Past Surgical History:  Procedure Laterality Date   HAMMER TOE SURGERY Bilateral 11/2015 - 01/2016   "right-left"   JOINT REPLACEMENT     SOFT TISSUE BIOPSY Right ~ 1986   "@ [facility]"   TOTAL KNEE ARTHROPLASTY Right 08/22/2016   Procedure: TOTAL KNEE ARTHROPLASTY;  Surgeon: DNinetta Lights MD;  Location[place] [facility]  Service: Orthopedics;  Laterality: Right;   TOTAL KNEE ARTHROPLASTY Left 10/31/2016   TOTAL KNEE ARTHROPLASTY Left 10/31/2016   Procedure: TOTAL KNEE ARTHROPLASTY;  Surgeon: DNinetta Lights MD;  Location[place] [facility]  Service: Orthopedics;  Laterality: Left;   Family History  Problem Relation Age of Onset   Heart disease Mother    Stroke Mother    Suicidality Father    Cancer Sister        breast   Social History   Socioeconomic History  Marital status: Married    Spouse name: Not on file   Number of children: 2   Years of education: Not on file   Highest education level: Not on file  Occupational History   Not on file  Tobacco Use   Smoking status: Never   Smokeless tobacco: Never  Vaping Use   Vaping Use: Never used  Substance and Sexual Activity   Alcohol use: Yes    Alcohol/week: 0.0 standard drinks of alcohol    Comment: 10/31/2016 "wine a couple times/month"   Drug use: No   Sexual activity: Yes    Partners: Female  Other Topics Concern   Not on file  Social History Narrative   Not on file   Social Determinants of Health   Financial  Resource Strain: Low Risk  (11/22/2022)   Overall Financial Resource Strain (CARDIA)    Difficulty of Paying Living Expenses: Not hard at all  Food Insecurity: No Food Insecurity (11/22/2022)   Hunger Vital Sign    Worried About Running Out of Food in the Last Year: Never true    [place] in the Last Year: Never true  Transportation Needs: No Transportation Needs (11/22/2022)   PRAPARE - Hydrologist (Medical): No    Lack of Transportation (Non-Medical): No  Physical Activity: Sufficiently Active (11/22/2022)   Exercise Vital Sign    Days of Exercise per Week: 5 days    Minutes of Exercise per Session: 150+ min  Stress: No Stress Concern Present (11/22/2022)   [place]    Feeling of Stress : Not at all  Social Connections: [place] (11/22/2022)   Social Connection and Isolation Panel [NHANES]    Frequency of Communication with Friends and Family: More than three times a week    Frequency of Social Gatherings with Friends and Family: More than three times a week    Attends Religious Services: More than 4 times per year    Active Member of [employer] or Organizations: Yes    Attends Music therapist: More than 4 times per year    Marital Status: Married    Tobacco Counseling Counseling given: Not Answered   Clinical Intake:  Pre-visit preparation completed: Yes  Pain : No/denies pain     BMI - recorded: 36.34 Nutritional Status: BMI > 30  Obese Nutritional Risks: None Diabetes: No  How often do you need to have someone help you when you read instructions, pamphlets, or other written materials from your doctor or pharmacy?: 1 - Never  Diabetic?no  Interpreter Needed?: No  Comments: lives w/wife Information entered by :: B.Meeghan Skipper,LPN   Activities of Daily Living    11/22/2022    8:23 AM 08/13/2022    3:28 PM  In your present state of health, do  you have any difficulty performing the following activities:  Hearing? 0 0  Vision? 0 0  Difficulty concentrating or making decisions? 0 0  Walking or climbing stairs? 0 0  Dressing or bathing? 0 0  Doing errands, shopping? 0 0  Preparing Food and eating ? N   Using the Toilet? N   In the past six months, have you accidently leaked urine? N   Do you have problems with loss of bowel control? N   Managing your Medications? N   Managing your Finances? N   Housekeeping or managing your Housekeeping? N     Patient Care Team: Lucio Edward,  Florina Ou as PCP - General (Family Medicine) Hollice Espy, MD as Consulting Physician (Urology)  Indicate any recent Medical Services you may have received from other than [facility] providers in the past year (date may be approximate).     Assessment:   This is a routine wellness examination for [place].  Hearing/Vision screen Hearing Screening - Comments:: Adequate hearing Vision Screening - Comments:: Adequate vision Dr Matilde Sprang  Dietary issues and exercise activities discussed: Current Exercise Habits: Structured exercise class;Home exercise routine, Type of exercise: walking, Time (Minutes): > 60, Frequency (Times/Week): 5, Weekly Exercise (Minutes/Week): 0, Intensity: Moderate, Exercise limited by: None identified   Goals Addressed   None    Depression Screen    11/22/2022    8:19 AM 08/13/2022    3:28 PM 02/08/2022    2:46 PM 08/08/2021    8:23 AM 04/25/2021    1:29 PM 01/31/2021    3:42 PM 07/12/2020    9:32 AM  PHQ 2/9 Scores  PHQ - 2 Score 0 0 0 0 0 0 0  PHQ- 9 Score  0 0 0       Fall Risk    11/22/2022    8:16 AM 08/13/2022    3:28 PM 02/08/2022    2:46 PM 08/08/2021    8:23 AM 04/25/2021    1:29 PM  Fall Risk   Falls in the past year? 0 0 0 0 0  Number falls in past yr: 0 0  0 0  Injury with Fall? 0 0  0 0  Risk for fall due to : No Fall Risks No Fall Risks No Fall Risks No Fall Risks   Follow up Education provided;Falls prevention  discussed Falls prevention discussed;Falls evaluation completed;Education provided Falls prevention discussed Falls prevention discussed Falls evaluation completed    FALL RISK PREVENTION PERTAINING TO THE HOME:  Any stairs in or around the home? No  If so, are there any without handrails? No  Home free of loose throw rugs in walkways, pet beds, electrical cords, etc? Yes  Adequate lighting in your home to reduce risk of falls? Yes   ASSISTIVE DEVICES UTILIZED TO PREVENT FALLS:  Life alert? No  Use of a cane, walker or w/c? No  Grab bars in the bathroom? Yes  Shower chair or bench in shower? Yes  Elevated toilet seat or a handicapped toilet? Yes   Cognitive Function:        11/22/2022    8:29 AM 07/12/2020    9:34 AM  6CIT Screen  What Year? 0 points 0 points  What month? 0 points 0 points  What time? 0 points 0 points  Count back from 20 0 points 0 points  Months in reverse 0 points 0 points  Repeat phrase 0 points 4 points  Total Score 0 points 4 points    Immunizations Immunization History  Administered Date(s) Administered   Influenza, High Dose Seasonal PF 07/08/2018   Influenza, Seasonal, Injecte, Preservative Fre 04/10/2012, 06/24/2013   Influenza,inj,Quad PF,6+ Mos 08/06/2016, 06/06/2017   PFIZER(Purple Top)SARS-COV-2 Vaccination 10/12/2019, 11/05/2019, 06/30/2020   Tdap 02/08/2011   Zoster, Live 03/19/2013    TDAP status: Up to date  Flu Vaccine status: Up to date  Pneumococcal vaccine status: Declined,  Education has been provided regarding the importance of this vaccine but patient still declined. Advised may receive this vaccine at local pharmacy or Health Dept. Aware to provide a copy of the vaccination record if obtained from local pharmacy or Health Dept. Verbalized  acceptance and understanding.   Covid-19 vaccine status: Completed vaccines  Qualifies for Shingles Vaccine? Yes   Zostavax completed No   Shingrix Completed?: No.    Education has been  provided regarding the importance of this vaccine. Patient has been advised to call insurance company to determine out of pocket expense if they have not yet received this vaccine. Advised may also receive vaccine at local pharmacy or Health Dept. Verbalized acceptance and understanding.  Screening Tests Health Maintenance  Topic Date Due   Zoster Vaccines- Shingrix (1 of 2) Never done   COVID-19 Vaccine (4 - 2023-24 season) 05/11/2022   INFLUENZA VACCINE  12/09/2022 (Originally 04/10/2022)   Pneumonia Vaccine 73+ Years old (1 of 1 - PCV) 08/14/2023 (Originally 12/21/2017)   COLONOSCOPY (Pts 45-70yr Insurance coverage will need to be confirmed)  04/24/2023   Medicare Annual Wellness (AWV)  11/22/2023   Hepatitis C Screening  Completed   HPV VACCINES  Aged Out   DTaP/Tdap/Td  Discontinued    Health Maintenance  Health Maintenance Due  Topic Date Due   Zoster Vaccines- Shingrix (1 of 2) Never done   COVID-19 Vaccine (4 - 2023-24 season) 05/11/2022    Colorectal cancer screening: Type of screening: Colonoscopy. Completed yes. Repeat every 10 years [id number] Lung Cancer Screening: (Low Dose CT Chest recommended if Age 70-80years, 30 pack-year currently smoking OR have quit w/in 15years.) does not qualify.   Lung Cancer Screening Referral: no  Additional Screening:  Hepatitis C Screening: does not qualify; Completed no  Vision Screening: Recommended annual ophthalmology exams for early detection of glaucoma and other disorders of the eye. Is the patient up to date with their annual eye exam?  Yes  Who is the provider or what is the name of the office in which the patient attends annual eye exams? Dr NMatilde SprangIf pt is not established with a provider, would they like to be referred to a provider to establish care? No .   Dental Screening: Recommended annual dental exams for proper oral hygiene  Community Resource Referral / Chronic Care Management: CRR required this visit?  No    CCM required this visit?  No      Plan:     I have personally reviewed and noted the following in the patient's chart:   Medical and social history Use of alcohol, tobacco or illicit drugs  Current medications and supplements including opioid prescriptions. Patient is not currently taking opioid prescriptions. Functional ability and status Nutritional status Physical activity Advanced directives List of other physicians Hospitalizations, surgeries, and ER visits in previous 12 months Vitals Screenings to include cognitive, depression, and falls Referrals and appointments  In addition, I have reviewed and discussed with patient certain preventive protocols, quality metrics, and best practice recommendations. A written personalized care plan for preventive services as well as general preventive health recommendations were provided to patient.     [facility] LPN   [id number]  Nurse Notes: pt is doing well: continues an active lifestyle, enjoying family, friends,doing things in his community. He has no concerns or questions during the visit.

## 2022-11-27 ENCOUNTER — Telehealth: Payer: Self-pay | Admitting: *Deleted

## 2022-11-27 ENCOUNTER — Other Ambulatory Visit: Payer: Self-pay | Admitting: *Deleted

## 2022-11-27 DIAGNOSIS — Z1211 Encounter for screening for malignant neoplasm of colon: Secondary | ICD-10-CM

## 2022-11-27 MED ORDER — NA SULFATE-K SULFATE-MG SULF 17.5-3.13-1.6 GM/177ML PO SOLN: 1.0000 | Freq: Once | ORAL | 0 refills | Status: AC

## 2022-11-27 NOTE — Telephone Encounter (Signed)
Gastroenterology Pre-Procedure Review  Request Date: 12/12/2022 Requesting Physician: Dr. Vicente Males  PATIENT REVIEW QUESTIONS: The patient responded to the following health history questions as indicated:    1. Are you having any GI issues? no 2. Do you have a personal history of Polyps? no 3. Do you have a family history of Colon Cancer or Polyps? no 4. Diabetes Mellitus? no 5. Joint replacements in the past 12 months?no 6. Major health problems in the past 3 months?no 7. Any artificial heart valves, MVP, or defibrillator?no    MEDICATIONS & ALLERGIES:    Patient reports the following regarding taking any anticoagulation/antiplatelet therapy:   Plavix, Coumadin, Eliquis, Xarelto, Lovenox, Pradaxa, Brilinta, or Effient? no Aspirin? yes (81 mg)  Patient confirms/reports the following medications:  Current Outpatient Medications  Medication Sig Dispense Refill   Na Sulfate-K Sulfate-Mg Sulf 17.5-3.13-1.6 GM/177ML SOLN Take 1 kit by mouth once for 1 dose. 354 mL 0   amLODipine (NORVASC) 5 MG tablet TAKE 1 AND 1/2 TABLETS BY MOUTH EVERY DAY 135 tablet 1   aspirin EC 81 MG tablet Take 1 tablet (81 mg total) by mouth daily.     meloxicam (MOBIC) 15 MG tablet TAKE 1 TABLET (15 MG TOTAL) BY MOUTH DAILY. 30 tablet 0   Omega-3 Fatty Acids (FISH OIL) 1200 MG CAPS Take 1,200 mg by mouth.     tadalafil (CIALIS) 5 MG tablet Take 1 tablet (5 mg total) by mouth daily. 90 tablet 3   telmisartan-hydrochlorothiazide (MICARDIS HCT) 80-25 MG tablet TAKE 1 TABLET BY MOUTH EVERY DAY FOR BLOOD PRESSURE 90 tablet 1   No current facility-administered medications for this visit.    Patient confirms/reports the following allergies:  No Known Allergies  No orders of the defined types were placed in this encounter.   AUTHORIZATION INFORMATION Primary Insurance: 1D#: Group #:  Secondary Insurance: 1D#: Group #:  SCHEDULE INFORMATION: Date: 12/12/2022 Time: Location: [place]

## 2022-12-04 ENCOUNTER — Other Ambulatory Visit: Payer: Self-pay | Admitting: Podiatry

## 2022-12-12 ENCOUNTER — Encounter
Admission: RE | Disposition: A | Discharge status: Home / Self Care | Payer: Self-pay | Source: Home / Self Care | Attending: Gastroenterology

## 2022-12-12 ENCOUNTER — Ambulatory Visit
Admission: RE | Admit: 2022-12-12 | Discharge: 2022-12-12 | Disposition: A | Discharge status: Home / Self Care | Payer: 59 | Attending: Gastroenterology | Admitting: Gastroenterology

## 2022-12-12 ENCOUNTER — Ambulatory Visit: Payer: 59 | Admitting: Anesthesiology

## 2022-12-12 ENCOUNTER — Encounter: Payer: Self-pay | Admitting: Gastroenterology

## 2022-12-12 DIAGNOSIS — G4733 Obstructive sleep apnea (adult) (pediatric): Secondary | ICD-10-CM | POA: Insufficient documentation

## 2022-12-12 DIAGNOSIS — Z1211 Encounter for screening for malignant neoplasm of colon: Secondary | ICD-10-CM

## 2022-12-12 DIAGNOSIS — I10 Essential (primary) hypertension: Secondary | ICD-10-CM | POA: Insufficient documentation

## 2022-12-12 DIAGNOSIS — N529 Male erectile dysfunction, unspecified: Secondary | ICD-10-CM | POA: Diagnosis not present

## 2022-12-12 DIAGNOSIS — M1990 Unspecified osteoarthritis, unspecified site: Secondary | ICD-10-CM | POA: Insufficient documentation

## 2022-12-12 HISTORY — PX: COLONOSCOPY WITH PROPOFOL: SHX5780

## 2022-12-12 SURGERY — COLONOSCOPY WITH PROPOFOL
Anesthesia: General

## 2022-12-12 MED ORDER — PROPOFOL 10 MG/ML IV BOLUS
INTRAVENOUS | Status: DC | PRN
  Administered 2022-12-12: 20 mg via INTRAVENOUS
  Administered 2022-12-12: 80 mg via INTRAVENOUS

## 2022-12-12 MED ORDER — SODIUM CHLORIDE 0.9 % IV SOLN: INTRAVENOUS | Status: DC

## 2022-12-12 MED ORDER — STERILE WATER FOR IRRIGATION IR SOLN
Status: DC | PRN
  Administered 2022-12-12: 60 mL

## 2022-12-12 MED ORDER — PROPOFOL 500 MG/50ML IV EMUL
INTRAVENOUS | Status: DC | PRN
  Administered 2022-12-12: 150 ug/kg/min via INTRAVENOUS

## 2022-12-12 MED ORDER — PROPOFOL 1000 MG/100ML IV EMUL
INTRAVENOUS | Status: AC
  Filled 2022-12-12: qty 100

## 2022-12-12 NOTE — Op Note (Signed)
[facility] Gastroenterology Patient Name: Warren [name] Procedure Date: 12/12/2022 10:07 AM MRN: [id number] Account #: [id number] Date of Birth: Nov 12, 1950 Admit Type: Outpatient Age: 72 Room: [facility] ENDO ROOM 3 Gender: Male Note Status: Finalized Instrument Name: Jasper Riling [id number] Procedure:             Colonoscopy Indications:           Screening for colorectal malignant neoplasm Providers:             Jonathon Bellows MD, MD Referring MD:          Delsa Grana (Referring MD) Medicines:             Monitored Anesthesia Care Complications:         No immediate complications. Procedure:             Pre-Anesthesia Assessment:                        - Prior to the procedure, a History and Physical was                         performed, and patient medications, allergies and                         sensitivities were reviewed. The patient's tolerance                         of previous anesthesia was reviewed.                        - The risks and benefits of the procedure and the                         sedation options and risks were discussed with the                         patient. All questions were answered and informed                         consent was obtained.                        - ASA Grade Assessment: II - A patient with mild                         systemic disease.                        After obtaining informed consent, the colonoscope was                         passed under direct vision. Throughout the procedure,                         the patient's blood pressure, pulse, and oxygen                         saturations were monitored continuously. The                         Colonoscope was introduced through  the anus and                         advanced to the the cecum, identified by the                         appendiceal orifice. The colonoscopy was performed                         with ease. The patient tolerated the procedure well.                          The quality of the bowel preparation was excellent.                         The ileocecal valve, appendiceal orifice, and rectum                         were photographed. Findings:      The perianal and digital rectal examinations were normal.      The entire examined colon appeared normal on direct and retroflexion       views. Impression:            - The entire examined colon is normal on direct and                         retroflexion views.                        - No specimens collected. Recommendation:        - Discharge patient to home (with escort).                        - Resume previous diet.                        - Continue present medications.                        - Repeat colonoscopy in 10 years for screening                         purposes. Procedure Code(s):     --- Professional ---                        [phone], Colonoscopy, flexible; diagnostic, including                         collection of specimen(s) by brushing or washing, when                         performed (separate procedure) Diagnosis Code(s):     --- Professional ---                        Z12.11, Encounter for screening for malignant neoplasm                         of colon CPT copyright 2022 American Medical Association. All rights reserved. The codes documented in this report are  preliminary and upon coder review may  be revised to meet current compliance requirements. Jonathon Bellows, MD Jonathon Bellows MD, MD 12/12/2022 10:57:13 AM This report has been signed electronically. Number of Addenda: 0 Note Initiated On: 12/12/2022 10:07 AM Scope Withdrawal Time: 0 hours 10 minutes 9 seconds  Total Procedure Duration: 0 hours 11 minutes 44 seconds  Estimated Blood Loss:  Estimated blood loss: none.      [facility]

## 2022-12-12 NOTE — Transfer of Care (Signed)
Immediate Anesthesia Transfer of Care Note  Patient: Warren [name] [place]  Procedure(s) Performed: COLONOSCOPY WITH PROPOFOL  Patient Location: PACU  Anesthesia Type:General  Level of Consciousness: drowsy  Airway & Oxygen Therapy: Patient Spontanous Breathing  Post-op Assessment: Report given to RN and Post -op Vital signs reviewed and stable  Post vital signs: Reviewed and stable  Last Vitals:  Vitals Value Taken Time  BP 89/50 12/12/22 1057  Temp 35.9 C 12/12/22 1057  Pulse 69 12/12/22 1057  Resp 20 12/12/22 1057  SpO2 96 % 12/12/22 1057    Last Pain:  Vitals:   12/12/22 1057  TempSrc: Temporal  PainSc: Asleep         Complications: No notable events documented.

## 2022-12-12 NOTE — Anesthesia Preprocedure Evaluation (Signed)
Anesthesia Evaluation  Patient identified by MRN, date of birth, ID band Patient awake    Reviewed: Allergy & Precautions, NPO status , Patient's Chart, lab work & pertinent test results  History of Anesthesia Complications (+) MALIGNANT HYPERTHERMIA and history of anesthetic complications  Airway Mallampati: III  TM Distance: <3 FB Neck ROM: full    Dental  (+) Chipped, Poor Dentition, Missing, Caps   Pulmonary neg shortness of breath, sleep apnea    Pulmonary exam normal        Cardiovascular Exercise Tolerance: Good hypertension, (-) angina Normal cardiovascular exam     Neuro/Psych negative neurological ROS  negative psych ROS   GI/Hepatic negative GI ROS, Neg liver ROS,neg GERD  ,,  Endo/Other  negative endocrine ROS    Renal/GU negative Renal ROS  negative genitourinary   Musculoskeletal   Abdominal   Peds  Hematology negative hematology ROS (+)   Anesthesia Other Findings Past Medical History: 06/07/2017: Elevated PSA     Comment:  6.2 -- refer to urologist Sept 2018 02/27/2016: Erectile dysfunction 10/08/2007: Hypercholesterolemia without hypertriglyceridemia No date: Hypertension 1986: Malignant hyperthermia     Comment:  "told I had maybe a mild case of imalignant hyperthermia              at [place]" 02/24/2015: Obesity 02/24/2015: OSA on CPAP     Comment:  CPAP q night  No date: Osteoarthritis     Comment:  "feet and knees"  (10/31/2016) 09/05/2017: PIN (prostatic intraepithelial neoplasia)     Comment:  Prostate biopsy Nov 2018  Past Surgical History: 11/2015 - 01/2016: HAMMER TOE SURGERY; Bilateral     Comment:  "right-left" No date: JOINT REPLACEMENT ~ 1986: SOFT TISSUE BIOPSY; Right     Comment:  "@ [facility]" 08/22/2016: TOTAL KNEE ARTHROPLASTY; Right     Comment:  Procedure: TOTAL KNEE ARTHROPLASTY;  Surgeon: Ninetta Lights, MD;  Location: [place];  Service: Orthopedics;                 Laterality: Right; 10/31/2016: TOTAL KNEE ARTHROPLASTY; Left 10/31/2016: TOTAL KNEE ARTHROPLASTY; Left     Comment:  Procedure: TOTAL KNEE ARTHROPLASTY;  Surgeon: Ninetta Lights, MD;  Location: [place];  Service: Orthopedics;                Laterality: Left;  BMI    Body Mass Index: 36.77 kg/m      Reproductive/Obstetrics negative OB ROS                             Anesthesia Physical Anesthesia Plan  ASA: 3  Anesthesia Plan: General   Post-op Pain Management:    Induction: Intravenous  PONV Risk Score and Plan: Propofol infusion and TIVA  Airway Management Planned: Natural Airway and Nasal Cannula  Additional Equipment:   Intra-op Plan:   Post-operative Plan:   Informed Consent: I have reviewed the patients History and Physical, chart, labs and discussed the procedure including the risks, benefits and alternatives for the proposed anesthesia with the patient or authorized representative who has indicated his/her understanding and acceptance.     Dental Advisory Given  Plan Discussed with: Anesthesiologist, CRNA and Surgeon  Anesthesia Plan Comments: ([place] precautions   Patient consented for risks of anesthesia including but not limited  to:  - adverse reactions to medications - risk of airway placement if required - damage to eyes, teeth, lips or other oral mucosa - nerve damage due to positioning  - sore throat or hoarseness - Damage to heart, brain, nerves, lungs, other parts of body or loss of life  Patient voiced understanding.)       Anesthesia Quick Evaluation

## 2022-12-12 NOTE — H&P (Signed)
Warren Bellows, MD [address] [place], [place], [place], [address] [address], [place], [place], [place], [address] Phone: [phone]  Fax: [phone]  Primary Care Physician:  Delsa Grana, PA-C   Pre-Procedure History & Physical: HPI:  Warren [name] is a 72 y.o. male is here for an colonoscopy.   Past Medical History:  Diagnosis Date   Elevated PSA 06/07/2017   6.2 -- refer to urologist Sept 2018   Erectile dysfunction 02/27/2016   Hypercholesterolemia without hypertriglyceridemia 10/08/2007   Hypertension    Malignant hyperthermia 1986   "told I had maybe a mild case of imalignant hyperthermia at [facility]"   Obesity 02/24/2015   OSA on CPAP 02/24/2015   CPAP q night    Osteoarthritis    "feet and knees"  (10/31/2016)   PIN (prostatic intraepithelial neoplasia) 09/05/2017   Prostate biopsy Nov 2018    Past Surgical History:  Procedure Laterality Date   HAMMER TOE SURGERY Bilateral 11/2015 - 01/2016   "right-left"   JOINT REPLACEMENT     SOFT TISSUE BIOPSY Right ~ 1986   "@ [facility]"   TOTAL KNEE ARTHROPLASTY Right 08/22/2016   Procedure: TOTAL KNEE ARTHROPLASTY;  Surgeon: Ninetta Lights, MD;  Location: [facility];  Service: Orthopedics;  Laterality: Right;   TOTAL KNEE ARTHROPLASTY Left 10/31/2016   TOTAL KNEE ARTHROPLASTY Left 10/31/2016   Procedure: TOTAL KNEE ARTHROPLASTY;  Surgeon: Ninetta Lights, MD;  Location: [facility];  Service: Orthopedics;  Laterality: Left;    Prior to Admission medications   Medication Sig Start Date End Date Taking? Authorizing Provider  amLODipine (NORVASC) 5 MG tablet TAKE 1 AND 1/2 TABLETS BY MOUTH EVERY DAY 09/12/22  Yes Delsa Grana, PA-C  aspirin EC 81 MG tablet Take 1 tablet (81 mg total) by mouth daily. 06/06/17  Yes Lada, Satira Anis, MD  meloxicam (MOBIC) 15 MG tablet TAKE 1 TABLET (15 MG TOTAL) BY MOUTH DAILY. 12/04/22  Yes Hyatt, Max T, DPM  telmisartan-hydrochlorothiazide (MICARDIS HCT) 80-25 MG tablet TAKE 1 TABLET BY MOUTH EVERY DAY  FOR BLOOD PRESSURE 09/11/22  Yes Delsa Grana, PA-C  Omega-3 Fatty Acids (FISH OIL) 1200 MG CAPS Take 1,200 mg by mouth.    [provider]  tadalafil (CIALIS) 5 MG tablet Take 1 tablet (5 mg total) by mouth daily. 08/07/22   Hollice Espy, MD    Allergies as of 11/27/2022   (No Known Allergies)    Family History  Problem Relation Age of Onset   Heart disease Mother    Stroke Mother    Suicidality Father    Cancer Sister        breast    Social History   Socioeconomic History   Marital status: Married    Spouse name: Not on file   Number of children: 2   Years of education: Not on file   Highest education level: Not on file  Occupational History   Not on file  Tobacco Use   Smoking status: Never   Smokeless tobacco: Never  Vaping Use   Vaping Use: Never used  Substance and Sexual Activity   Alcohol use: Yes    Alcohol/week: 0.0 standard drinks of alcohol    Comment: 10/31/2016 "wine a couple times/month"   Drug use: No   Sexual activity: Yes    Partners: Female  Other Topics Concern   Not on file  Social History Narrative   Not on file   Social Determinants of Health  Financial Resource Strain: Low Risk  (11/22/2022)   Overall Financial Resource Strain (CARDIA)    Difficulty of Paying Living Expenses: Not hard at all  Food Insecurity: No Food Insecurity (11/22/2022)   Hunger Vital Sign    Worried About Running Out of Food in the Last Year: Never true    Ran Out of Food in the Last Year: Never true  Transportation Needs: No Transportation Needs (11/22/2022)   PRAPARE - Hydrologist (Medical): No    Lack of Transportation (Non-Medical): No  Physical Activity: Sufficiently Active (11/22/2022)   Exercise Vital Sign    Days of Exercise per Week: 5 days    Minutes of Exercise per Session: 150+ min  Stress: No Stress Concern Present (11/22/2022)   [place]     Feeling of Stress : Not at all  Social Connections: [place] (11/22/2022)   Social Connection and Isolation Panel [NHANES]    Frequency of Communication with Friends and Family: More than three times a week    Frequency of Social Gatherings with Friends and Family: More than three times a week    Attends Religious Services: More than 4 times per year    Active Member of [employer] or Organizations: Yes    Attends Music therapist: More than 4 times per year    Marital Status: Married  Human resources officer Violence: Not At Risk (11/22/2022)   Humiliation, Afraid, Rape, and Kick questionnaire    Fear of Current or Ex-Partner: No    Emotionally Abused: No    Physically Abused: No    Sexually Abused: No    Review of Systems: See HPI, otherwise negative ROS  Physical Exam: Pulse 68   Temp (!) 97.4 F (36.3 C) (Temporal)   Resp 18   Ht 6\' 2"  (1.88 m)   Wt 129.9 kg   SpO2 98%   BMI 36.77 kg/m  General:   Alert,  pleasant and cooperative in NAD Head:  Normocephalic and atraumatic. Neck:  Supple; no masses or thyromegaly. Lungs:  Clear throughout to auscultation, normal respiratory effort.    Heart:  +S1, +S2, Regular rate and rhythm, No edema. Abdomen:  Soft, nontender and nondistended. Normal bowel sounds, without guarding, and without rebound.   Neurologic:  Alert and  oriented x4;  grossly normal neurologically.  Impression/Plan: Warren [name] is here for an colonoscopy to be performed for Screening colonoscopy average risk   Risks, benefits, limitations, and alternatives regarding  colonoscopy have been reviewed with the patient.  Questions have been answered.  All parties agreeable.   Warren Bellows, MD  12/12/2022, 10:08 AM

## 2022-12-12 NOTE — Anesthesia Postprocedure Evaluation (Signed)
Anesthesia Post Note  Patient: Warren [name]  Procedure(s) Performed: COLONOSCOPY WITH PROPOFOL  Patient location during evaluation: Endoscopy Anesthesia Type: General Level of consciousness: awake and alert Pain management: pain level controlled Vital Signs Assessment: post-procedure vital signs reviewed and stable Respiratory status: spontaneous breathing, nonlabored ventilation, respiratory function stable and patient connected to nasal cannula oxygen Cardiovascular status: blood pressure returned to baseline and stable Postop Assessment: no apparent nausea or vomiting Anesthetic complications: no   No notable events documented.   Last Vitals:  Vitals:   12/12/22 1107 12/12/22 1117  BP: 98/78 113/72  Pulse: 62 (!) 52  Resp: (!) 22 16  Temp:    SpO2: 98% 98%    Last Pain:  Vitals:   12/12/22 1117  TempSrc:   PainSc: 0-No pain                 Precious Haws Shawnna Pancake

## 2022-12-13 ENCOUNTER — Encounter: Payer: Self-pay | Admitting: Gastroenterology

## 2023-01-03 ENCOUNTER — Other Ambulatory Visit: Payer: Self-pay | Admitting: Podiatry

## 2023-01-15 ENCOUNTER — Encounter: Payer: Self-pay | Admitting: Gastroenterology

## 2023-01-15 ENCOUNTER — Encounter: Payer: Self-pay | Admitting: Family Medicine

## 2023-01-15 ENCOUNTER — Encounter: Payer: 59 | Admitting: Family Medicine

## 2023-01-16 ENCOUNTER — Ambulatory Visit (INDEPENDENT_AMBULATORY_CARE_PROVIDER_SITE_OTHER): Payer: 59 | Admitting: Family Medicine

## 2023-01-16 ENCOUNTER — Encounter: Payer: Self-pay | Admitting: Family Medicine

## 2023-01-16 ENCOUNTER — Encounter: Payer: 59 | Admitting: Family Medicine

## 2023-01-16 VITALS — BP 136/76 | HR 74 | Resp 16 | Ht 74.0 in | Wt 295.0 lb

## 2023-01-16 DIAGNOSIS — I10 Essential (primary) hypertension: Secondary | ICD-10-CM | POA: Diagnosis not present

## 2023-01-16 DIAGNOSIS — Z0000 Encounter for general adult medical examination without abnormal findings: Secondary | ICD-10-CM

## 2023-01-16 DIAGNOSIS — E782 Mixed hyperlipidemia: Secondary | ICD-10-CM | POA: Diagnosis not present

## 2023-01-16 DIAGNOSIS — Z6837 Body mass index (BMI) 37.0-37.9, adult: Secondary | ICD-10-CM | POA: Diagnosis not present

## 2023-01-16 NOTE — Telephone Encounter (Signed)
Pt here today at office, taking that care today during visit.

## 2023-01-16 NOTE — Progress Notes (Signed)
Patient: Warren [name], Male    DOB: Mar 28, 1951, 72 y.o.   MRN: [id number] Danelle Berry, PA-C Visit Date: 01/16/2023  Today's Provider: Danelle Berry, PA-C   Chief Complaint  Patient presents with   Annual Exam   Subjective:   Annual physical exam:  Warren [name] is a 72 y.o. male who presents today for health maintenance and annual & complete physical exam.   Exercise/Activity:   still active but having trouble with ankle, this has limited some walking/golfing over the last couple months Diet/nutrition:  tries to eat healthy Sleep: CPAP - no concerns  Handicap placard application with him today  SDOH Screenings   Food Insecurity: No Food Insecurity (11/22/2022)  Housing: Low Risk  (11/22/2022)  Transportation Needs: No Transportation Needs (11/22/2022)  Utilities: Not At Risk (11/22/2022)  Alcohol Screen: Low Risk  (11/22/2022)  Depression (PHQ2-9): Low Risk  (01/16/2023)  Financial Resource Strain: Low Risk  (11/22/2022)  Physical Activity: Sufficiently Active (11/22/2022)  Social Connections: Socially Integrated (11/22/2022)  Stress: No Stress Concern Present (11/22/2022)  Tobacco Use: Low Risk  (01/16/2023)     USPSTF grade A and B recommendations - reviewed and addressed today  Depression:  Phq 9 completed today by patient, was reviewed by me with patient in the room, score is  negative, pt feels mood is good    01/16/2023    8:35 AM 11/22/2022    8:19 AM 08/13/2022    3:28 PM  Depression screen PHQ 2/9  Decreased Interest 0 0 0  Down, Depressed, Hopeless 0 0 0  PHQ - 2 Score 0 0 0  Altered sleeping 0  0  Tired, decreased energy 0  0  Change in appetite 0  0  Feeling bad or failure about yourself  0  0  Trouble concentrating 0  0  Moving slowly or fidgety/restless 0  0  Suicidal thoughts 0  0  PHQ-9 Score 0  0  Difficult doing work/chores   Not difficult at all    Hep C Screening: done  STD testing and prevention (HIV/chl/gon/syphilis): done previously, low  risk, no exposure  Intimate partner violence:safe  Advanced Care Planning:  A voluntary discussion about advance care planning including the explanation and discussion of advance directives.  Discussed health care proxy and Living will, and the patient was able to identify a health care proxy as wife.    Health Maintenance  Topic Date Due   COVID-19 Vaccine (4 - 2023-24 season) 05/11/2022   Zoster Vaccines- Shingrix (1 of 2) 04/18/2023 (Originally 12/22/2002)   Pneumonia Vaccine 50+ Years old (1 of 1 - PCV) 08/14/2023 (Originally 12/21/2017)   INFLUENZA VACCINE  04/11/2023   Medicare Annual Wellness (AWV)  11/22/2023   COLONOSCOPY (Pts 45-4yrs Insurance coverage will need to be confirmed)  12/11/2032   Hepatitis C Screening  Completed   HPV VACCINES  Aged Out   DTaP/Tdap/Td  Discontinued   Skin cancer:   Pt reports no hx of skin cancer, suspicious lesions/biopsies in the past.  Colorectal cancer:  colonoscopy is UTD Pt denies   Prostate cancer: managing with urology Prostate cancer screening with PSA: Discussed risks and benefits of PSA testing and provided handout. Pt declines to have PSA drawn today - doing with urology Lab Results  Component Value Date   PSA 5.90 (H) 04/25/2021   PSA 6.30 (H) 01/31/2021    Urinary Symptoms:   IPSS     Row Name 01/16/23 [phone]  International Prostate Symptom Score   How often have you had the sensation of not emptying your bladder? Not at All     How often have you had to urinate less than every two hours? Not at All     How often have you found you stopped and started again several times when you urinated? Not at All     How often have you found it difficult to postpone urination? Not at All     How often have you had a weak urinary stream? Not at All     How often have you had to strain to start urination? Not at All     How many times did you typically get up at night to urinate? 1 Time     Total IPSS Score 1               Lung cancer:  n/a Low Dose CT Chest recommended if Age 33-80 years, 20 pack-year currently smoking OR have quit w/in 15years. Patient does not qualify.   Social History   Tobacco Use   Smoking status: Never   Smokeless tobacco: Never  Substance Use Topics   Alcohol use: Yes    Alcohol/week: 0.0 standard drinks of alcohol    Comment: 10/31/2016 "wine a couple times/month"     Alcohol screening: Flowsheet Row Clinical Support from 11/22/2022 in [facility]  AUDIT-C Score 0       AAA:   n/a to pt The USPSTF recommends one-time screening with ultrasonography in men ages 11 to 68 years who have ever smoked  ECG: not indicated today  Blood pressure/Hypertension: BP Readings from Last 3 Encounters:  01/16/23 136/76  12/12/22 113/72  08/13/22 (!) 158/82   Weight/Obesity: Wt Readings from Last 10 Encounters:  01/16/23 295 lb (133.8 kg)  12/12/22 286 lb 6.4 oz (129.9 kg)  11/22/22 283 lb (128.4 kg)  08/13/22 296 lb 8 oz (134.5 kg)  08/07/22 280 lb (127 kg)  02/08/22 288 lb 11.2 oz (131 kg)  08/08/21 281 lb 11.2 oz (127.8 kg)  08/02/21 283 lb (128.4 kg)  04/25/21 283 lb 8 oz (128.6 kg)  01/31/21 287 lb 1.6 oz (130.2 kg)   BMI Readings from Last 3 Encounters:  01/16/23 37.88 kg/m  12/12/22 36.77 kg/m  11/22/22 36.34 kg/m    Lipids:  Lab Results  Component Value Date   CHOL 197 08/08/2021   CHOL 220 (H) 01/31/2021   CHOL 208 (H) 12/24/2019   Lab Results  Component Value Date   HDL 38 (L) 08/08/2021   HDL 36 (L) 01/31/2021   HDL 39 (L) 12/24/2019   Lab Results  Component Value Date   LDLCALC 131 (H) 08/08/2021   LDLCALC 139 (H) 01/31/2021   LDLCALC 138 (H) 12/24/2019   Lab Results  Component Value Date   TRIG 161 (H) 08/08/2021   TRIG 317 (H) 01/31/2021   TRIG 176 (H) 12/24/2019   Lab Results  Component Value Date   CHOLHDL 5.2 (H) 08/08/2021   CHOLHDL 6.1 (H) 01/31/2021   CHOLHDL 5.3 (H) 12/24/2019   No results found  for: "LDLDIRECT" Based on the results of lipid panel his/her cardiovascular risk factor ( using [place] Cohort )  in the next 10 years is : The 10-year ASCVD risk score (Arnett DK, et al., 2019) is: 22.6%   Values used to calculate the score:     Age: 78 years     Sex: Male  Is Non-Hispanic African American: Yes     Diabetic: No     Tobacco smoker: No     Systolic Blood Pressure: 136 mmHg     Is BP treated: Yes     HDL Cholesterol: 38 mg/dL     Total Cholesterol: 197 mg/dL Glucose:  Glucose, Bld  Date Value Ref Range Status  08/08/2021 101 (H) 65 - 99 mg/dL Final    Comment:    .            Fasting reference interval . For someone without known diabetes, a glucose value between 100 and 125 mg/dL is consistent with prediabetes and should be confirmed with a follow-up test. .   04/25/2021 68 65 - 99 mg/dL Final    Comment:    .            Fasting reference interval .   01/31/2021 94 65 - 99 mg/dL Final    Comment:    .            Fasting reference interval .     Social History       Social History   Socioeconomic History   Marital status: Married    Spouse name: Not on file   Number of children: 2   Years of education: Not on file   Highest education level: Not on file  Occupational History   Not on file  Tobacco Use   Smoking status: Never   Smokeless tobacco: Never  Vaping Use   Vaping Use: Never used  Substance and Sexual Activity   Alcohol use: Yes    Alcohol/week: 0.0 standard drinks of alcohol    Comment: 10/31/2016 "wine a couple times/month"   Drug use: No   Sexual activity: Yes    Partners: Female  Other Topics Concern   Not on file  Social History Narrative   Not on file   Social Determinants of Health   Financial Resource Strain: Low Risk  (11/22/2022)   Overall Financial Resource Strain (CARDIA)    Difficulty of Paying Living Expenses: Not hard at all  Food Insecurity: No Food Insecurity (11/22/2022)   Hunger Vital Sign    Worried  About Running Out of Food in the Last Year: Never true    Ran Out of Food in the Last Year: Never true  Transportation Needs: No Transportation Needs (11/22/2022)   PRAPARE - Administrator, Civil Service (Medical): No    Lack of Transportation (Non-Medical): No  Physical Activity: Sufficiently Active (11/22/2022)   Exercise Vital Sign    Days of Exercise per Week: 5 days    Minutes of Exercise per Session: 150+ min  Stress: No Stress Concern Present (11/22/2022)   [employer] of Occupational Health - Occupational Stress Questionnaire    Feeling of Stress : Not at all  Social Connections: Socially Integrated (11/22/2022)   Social Connection and Isolation Panel [NHANES]    Frequency of Communication with Friends and Family: More than three times a week    Frequency of Social Gatherings with Friends and Family: More than three times a week    Attends Religious Services: More than 4 times per year    Active Member of [employer] or Organizations: Yes    Attends Engineer, structural: More than 4 times per year    Marital Status: Married    Family History        Family History  Problem Relation Age of Onset  Heart disease Mother    Stroke Mother    Suicidality Father    Cancer Sister        breast    Patient Active Problem List   Diagnosis Date Noted   OSA on CPAP 10/31/2020   PIN (prostatic intraepithelial neoplasia) 09/05/2017   Colon cancer screening 07/03/2017   Elevated PSA 06/07/2017   Erectile dysfunction 02/27/2016   Class 2 severe obesity due to excess calories with serious comorbidity and body mass index (BMI) of 38.0 to 38.9 in adult [facility]) 02/24/2015   Allergic rhinitis 01/12/2008   Hyperlipidemia 10/08/2007   Benign essential HTN 08/13/2007    Past Surgical History:  Procedure Laterality Date   COLONOSCOPY WITH PROPOFOL N/A 12/12/2022   Procedure: COLONOSCOPY WITH PROPOFOL;  Surgeon: Wyline Mood, MD;  Location: [facility] ENDOSCOPY;  Service:  Gastroenterology;  Laterality: N/A;   HAMMER TOE SURGERY Bilateral 11/2015 - 01/2016   "right-left"   JOINT REPLACEMENT     SOFT TISSUE BIOPSY Right ~ 1986   "@ [facility]"   TOTAL KNEE ARTHROPLASTY Right 08/22/2016   Procedure: TOTAL KNEE ARTHROPLASTY;  Surgeon: Loreta Ave, MD;  Location[facility];  Service: Orthopedics;  Laterality: Right;   TOTAL KNEE ARTHROPLASTY Left 10/31/2016   TOTAL KNEE ARTHROPLASTY Left 10/31/2016   Procedure: TOTAL KNEE ARTHROPLASTY;  Surgeon: Loreta Ave, MD;  Location: [facility] OR;  Service: Orthopedics;  Laterality: Left;     Current Outpatient Medications:    amLODipine (NORVASC) 5 MG tablet, TAKE 1 AND 1/2 TABLETS BY MOUTH EVERY DAY, Disp: 135 tablet, Rfl: 1   aspirin EC 81 MG tablet, Take 1 tablet (81 mg total) by mouth daily., Disp: , Rfl:    meloxicam (MOBIC) 15 MG tablet, TAKE 1 TABLET (15 MG TOTAL) BY MOUTH DAILY., Disp: 30 tablet, Rfl: 0   Omega-3 Fatty Acids (FISH OIL) 1200 MG CAPS, Take 1,200 mg by mouth., Disp: , Rfl:    tadalafil (CIALIS) 5 MG tablet, Take 1 tablet (5 mg total) by mouth daily., Disp: 90 tablet, Rfl: 3   telmisartan-hydrochlorothiazide (MICARDIS HCT) 80-25 MG tablet, TAKE 1 TABLET BY MOUTH EVERY DAY FOR BLOOD PRESSURE, Disp: 90 tablet, Rfl: 1  No Known Allergies  Patient Care Team: Danelle Berry, PA-C as PCP - General (Family Medicine) Vanna Scotland, MD as Consulting Physician (Urology)   Chart Review: I personally reviewed active problem list, medication list, allergies, family history, social history, health maintenance, notes from last encounter, lab results, imaging with the patient/caregiver today.   Review of Systems  Constitutional: Negative.   HENT: Negative.    Eyes: Negative.   Respiratory: Negative.    Cardiovascular: Negative.   Gastrointestinal: Negative.   Endocrine: Negative.   Genitourinary: Negative.   Musculoskeletal:  Positive for gait problem and joint swelling. Negative for back pain.  Skin: Negative.    Allergic/Immunologic: Negative.   Hematological: Negative.   Psychiatric/Behavioral: Negative.    All other systems reviewed and are negative.         Objective:   Vitals:  Vitals:   01/16/23 0836  BP: 136/76  Pulse: 74  Resp: 16  SpO2: 96%  Weight: 295 lb (133.8 kg)  Height: 6\' 2"  (1.88 m)    Body mass index is 37.88 kg/m.  Physical Exam Vitals and nursing note reviewed.  Constitutional:      General: He is not in acute distress.    Appearance: Normal appearance. He is well-developed. He is obese. He is not ill-appearing, toxic-appearing or diaphoretic.  HENT:     Head: Normocephalic and atraumatic.     Jaw: No trismus.     Right Ear: Tympanic membrane, ear canal and external ear normal. There is no impacted cerumen.     Left Ear: Tympanic membrane, ear canal and external ear normal. There is no impacted cerumen.     Nose: Congestion present. No mucosal edema or rhinorrhea.     Right Sinus: No maxillary sinus tenderness or frontal sinus tenderness.     Left Sinus: No maxillary sinus tenderness or frontal sinus tenderness.     Mouth/Throat:     Mouth: Mucous membranes are moist.     Pharynx: Oropharynx is clear. Uvula midline. No oropharyngeal exudate, posterior oropharyngeal erythema or uvula swelling.  Eyes:     General: Lids are normal. No scleral icterus.       Right eye: No discharge.        Left eye: No discharge.     Conjunctiva/sclera: Conjunctivae normal.  Neck:     Trachea: Trachea and phonation normal. No tracheal deviation.  Cardiovascular:     Rate and Rhythm: Normal rate and regular rhythm.     Pulses: Normal pulses.          Radial pulses are 2+ on the right side and 2+ on the left side.       Posterior tibial pulses are 2+ on the right side and 2+ on the left side.     Heart sounds: Normal heart sounds. No murmur heard.    No friction rub. No gallop.  Pulmonary:     Effort: Pulmonary effort is normal. No respiratory distress.     Breath sounds:  Normal breath sounds. No stridor. No wheezing, rhonchi or rales.  Abdominal:     General: Bowel sounds are normal. There is no distension.     Palpations: Abdomen is soft.     Tenderness: There is no abdominal tenderness. There is no guarding or rebound.  Musculoskeletal:     Cervical back: Normal range of motion and neck supple.     Right lower leg: No edema.     Left lower leg: No edema.  Skin:    General: Skin is warm and dry.     Capillary Refill: Capillary refill takes less than 2 seconds.     Findings: No rash.  Neurological:     Mental Status: He is alert. Mental status is at baseline.     Gait: Gait normal.  Psychiatric:        Mood and Affect: Mood normal.        Speech: Speech normal.        Behavior: Behavior normal.      No results found for this or any previous visit (from the past 2160 hour(s)).  Fall Risk:    01/16/2023    8:35 AM 11/22/2022    8:16 AM 08/13/2022    3:28 PM 02/08/2022    2:46 PM 08/08/2021    8:23 AM  Fall Risk   Falls in the past year? 0 0 0 0 0  Number falls in past yr: 0 0 0  0  Injury with Fall? 0 0 0  0  Risk for fall due to : No Fall Risks No Fall Risks No Fall Risks No Fall Risks No Fall Risks  Follow up Falls prevention discussed Education provided;Falls prevention discussed Falls prevention discussed;Falls evaluation completed;Education provided Falls prevention discussed Falls prevention discussed    Functional Status Survey: Is the  patient deaf or have difficulty hearing?: No Does the patient have difficulty seeing, even when wearing glasses/contacts?: No Does the patient have difficulty concentrating, remembering, or making decisions?: No Does the patient have difficulty walking or climbing stairs?: No Does the patient have difficulty dressing or bathing?: No Does the patient have difficulty doing errands alone such as visiting a doctor's office or shopping?: No   Assessment & Plan:    CPE completed today  Prostate cancer  screening and PSA options (with potential risks and benefits of testing vs not testing) were discussed along with recent recs/guidelines, shared decision making and handout/information given to pt today  USPSTF grade A and B recommendations reviewed with patient; age-appropriate recommendations, preventive care, screening tests, etc discussed and encouraged; healthy living encouraged; see AVS for patient education given to patient  Discussed importance of 150 minutes of physical activity weekly, AHA exercise recommendations given to pt in AVS/handout  Discussed importance of healthy diet:  eating lean meats and proteins, avoiding trans fats and saturated fats, avoid simple sugars and excessive carbs in diet, eat 6 servings of fruit/vegetables daily and drink plenty of water and avoid sweet beverages.  DASH diet reviewed if pt has HTN  Recommended pt to do annual eye exam and routine dental exams/cleanings  Advance Care planning information and packet discussed and offered today, encouraged pt to discuss with family members/spouse/partner/friends and complete Advanced directive packet and bring copy to office   Reviewed Health Maintenance: Health Maintenance  Topic Date Due   COVID-19 Vaccine (4 - 2023-24 season) 05/11/2022   Zoster Vaccines- Shingrix (1 of 2) 04/18/2023 (Originally 12/22/2002)   Pneumonia Vaccine 64+ Years old (1 of 1 - PCV) 08/14/2023 (Originally 12/21/2017)   INFLUENZA VACCINE  04/11/2023   Medicare Annual Wellness (AWV)  11/22/2023   COLONOSCOPY (Pts 45-27yrs Insurance coverage will need to be confirmed)  12/11/2032   Hepatitis C Screening  Completed   HPV VACCINES  Aged Out   DTaP/Tdap/Td  Discontinued    Immunizations: Immunization History  Administered Date(s) Administered   Influenza, High Dose Seasonal PF 07/08/2018   Influenza, Seasonal, Injecte, Preservative Fre 04/10/2012, 06/24/2013   Influenza,inj,Quad PF,6+ Mos 08/06/2016, 06/06/2017   PFIZER(Purple  Top)SARS-COV-2 Vaccination 10/12/2019, 11/05/2019, 06/30/2020   Tdap 02/08/2011   Zoster, Live 03/19/2013   Vaccines:   due for many - offered and discussed HPV: up to at age 24 , ask insurance if age between 22-45  Shingrix: 84-64 yo and ask insurance if covered when patient above 26 yo Pneumonia: due- educated and discussed with patient. Flu: out of season educated and discussed with patient. Tdap- due    1. Well adult exam Done today- see above  2. Class 2 severe obesity with serious comorbidity and body mass index (BMI) of 37.0 to 37.9 in adult, unspecified obesity type (HCC) Weight increased over the last couple months   3. Mixed hyperlipidemia Not on meds briefly reviewed this and past ASCVD scores and recommendation for statin  4. Benign essential HTN Near goal, home readings 120-130/70's today 136/76 on norvasc telmisartan-HCTZ  Return in about 6 months (around 07/19/2023) for HTN, HLD .      Danelle Berry, PA-C 01/16/23 8:59 AM  [facility]

## 2023-01-16 NOTE — Patient Instructions (Addendum)
Health Maintenance  Topic Date Due   DTaP/Tdap/Td vaccine (2 - Td or Tdap) 02/07/2021   COVID-19 Vaccine (4 - 2023-24 season) 05/11/2022   Zoster (Shingles) Vaccine (1 of 2) 04/18/2023*   Pneumonia Vaccine (1 of 1 - PCV) 08/14/2023*   Flu Shot  04/11/2023   Medicare Annual Wellness Visit  11/22/2023   Colon Cancer Screening  12/11/2032   Hepatitis C Screening: USPSTF Recommendation to screen - Ages 18-70 yo.  Completed   HPV Vaccine  72  *Topic was postponed. The date shown is not the original due date.   Lab Results  Component Value Date   CHOL 197 08/08/2021   HDL 38 (L) 08/08/2021   LDLCALC 131 (H) 08/08/2021   TRIG 161 (H) 08/08/2021   CHOLHDL 5.2 (H) 08/08/2021   The 10-year ASCVD risk score (Arnett DK, et al., 2019) is: 22.6%   Values used to calculate the score:     Age: 72 years     Sex: Male     Is Non-Hispanic African American: Yes     Diabetic: No     Tobacco smoker: No     Systolic Blood Pressure: 136 mmHg     Is BP treated: Yes     HDL Cholesterol: 38 mg/dL     Total Cholesterol: 197 mg/dL  Strongly recommend you start a statin for lowing risk of stroke and heart attack and it will lower cholesterol  Preventive Care 70 Years and Older, Male Preventive care refers to lifestyle choices and visits with your health care provider that can promote health and wellness. Preventive care visits are also called wellness exams. What can I expect for my preventive care visit? Counseling During your preventive care visit, your health care provider may ask about your: Medical history, including: Past medical problems. Family medical history. History of falls. Current health, including: Emotional well-being. Home life and relationship well-being. Sexual activity. Memory and ability to understand (cognition). Lifestyle, including: Alcohol, nicotine or tobacco, and drug use. Access to firearms. Diet, exercise, and sleep habits. Work and work  Astronomer. Sunscreen use. Safety issues such as seatbelt and bike helmet use. Physical exam Your health care provider will check your: Height and weight. These may be used to calculate your BMI (body mass index). BMI is a measurement that tells if you are at a healthy weight. Waist circumference. This measures the distance around your waistline. This measurement also tells if you are at a healthy weight and may help predict your risk of certain diseases, such as type 2 diabetes and high blood pressure. Heart rate and blood pressure. Body temperature. Skin for abnormal spots. What immunizations do I need?  Vaccines are usually given at various ages, according to a schedule. Your health care provider will recommend vaccines for you based on your age, medical history, and lifestyle or other factors, such as travel or where you work. What tests do I need? Screening Your health care provider may recommend screening tests for certain conditions. This may include: Lipid and cholesterol levels. Diabetes screening. This is done by checking your blood sugar (glucose) after you have not eaten for a while (fasting). Hepatitis C test. Hepatitis B test. HIV (human immunodeficiency virus) test. STI (sexually transmitted infection) testing, if you are at risk. Lung cancer screening. Colorectal cancer screening. Prostate cancer screening. Abdominal aortic aneurysm (AAA) screening. You may need this if you are a current or former smoker. Talk with your health care provider about your test results, treatment options,  and if necessary, the need for more tests. Follow these instructions at home: Eating and drinking  Eat a diet that includes fresh fruits and vegetables, whole grains, lean protein, and low-fat dairy products. Limit your intake of foods with high amounts of sugar, saturated fats, and salt. Take vitamin and mineral supplements as recommended by your health care provider. Do not drink  alcohol if your health care provider tells you not to drink. If you drink alcohol: Limit how much you have to 0-2 drinks a day. Know how much alcohol is in your drink. In the [place] one drink equals one 12 oz bottle of beer (355 mL), one 5 oz glass of wine (148 mL), or one 1 oz glass of hard liquor (44 mL). Lifestyle Brush your teeth every morning and night with fluoride toothpaste. Floss one time each day. Exercise for at least 30 minutes 5 or more days each week. Do not use any products that contain nicotine or tobacco. These products include cigarettes, chewing tobacco, and vaping devices, such as e-cigarettes. If you need help quitting, ask your health care provider. Do not use drugs. If you are sexually active, practice safe sex. Use a condom or other form of protection to prevent STIs. Take aspirin only as told by your health care provider. Make sure that you understand how much to take and what form to take. Work with your health care provider to find out whether it is safe and beneficial for you to take aspirin daily. Ask your health care provider if you need to take a cholesterol-lowering medicine (statin). Find healthy ways to manage stress, such as: Meditation, yoga, or listening to music. Journaling. Talking to a trusted person. Spending time with friends and family. Safety Always wear your seat belt while driving or riding in a vehicle. Do not drive: If you have been drinking alcohol. Do not ride with someone who has been drinking. When you are tired or distracted. While texting. If you have been using any mind-altering substances or drugs. Wear a helmet and other protective equipment during sports activities. If you have firearms in your house, make sure you follow all gun safety procedures. Minimize exposure to UV radiation to reduce your risk of skin cancer. What's next? Visit your health care provider once a year for an annual wellness visit. Ask your health care  provider how often you should have your eyes and teeth checked. Stay up to date on all vaccines. This information is not intended to replace advice given to you by your health care provider. Make sure you discuss any questions you have with your health care provider. Document Revised: 02/22/2021 Document Reviewed: 02/22/2021 Elsevier Patient Education  2023 [employer].

## 2023-01-17 LAB — CBC WITH DIFFERENTIAL/PLATELET
Absolute Monocytes: 366 cells/uL (ref 200–950)
Basophils Absolute: 48 cells/uL (ref 0–200)
Basophils Relative: 0.9 %
Eosinophils Absolute: 286 cells/uL (ref 15–500)
Eosinophils Relative: 5.4 %
HCT: 44.9 % (ref 38.5–50.0)
Hemoglobin: 15.2 g/dL (ref 13.2–17.1)
Lymphs Abs: 1977 cells/uL (ref 850–3900)
MCH: 30 pg (ref 27.0–33.0)
MCHC: 33.9 g/dL (ref 32.0–36.0)
MCV: 88.7 fL (ref 80.0–100.0)
MPV: 10.2 fL (ref 7.5–12.5)
Monocytes Relative: 6.9 %
Neutro Abs: 2624 cells/uL (ref 1500–7800)
Neutrophils Relative %: 49.5 %
Platelets: 269 10*3/uL (ref 140–400)
RBC: 5.06 10*6/uL (ref 4.20–5.80)
RDW: 12.6 % (ref 11.0–15.0)
Total Lymphocyte: 37.3 %
WBC: 5.3 10*3/uL (ref 3.8–10.8)

## 2023-01-17 LAB — COMPLETE METABOLIC PANEL WITH GFR
AG Ratio: 1.4 (calc) (ref 1.0–2.5)
ALT: 32 U/L (ref 9–46)
AST: 29 U/L (ref 10–35)
Albumin: 4.3 g/dL (ref 3.6–5.1)
Alkaline phosphatase (APISO): 41 U/L (ref 35–144)
BUN: 17 mg/dL (ref 7–25)
CO2: 27 mmol/L (ref 20–32)
Calcium: 9.4 mg/dL (ref 8.6–10.3)
Chloride: 104 mmol/L (ref 98–110)
Creat: 1.03 mg/dL (ref 0.70–1.28)
Globulin: 3 g/dL (calc) (ref 1.9–3.7)
Glucose, Bld: 97 mg/dL (ref 65–99)
Potassium: 3.5 mmol/L (ref 3.5–5.3)
Sodium: 141 mmol/L (ref 135–146)
Total Bilirubin: 0.8 mg/dL (ref 0.2–1.2)
Total Protein: 7.3 g/dL (ref 6.1–8.1)
eGFR: 78 mL/min/{1.73_m2} (ref >=60)

## 2023-01-17 LAB — LIPID PANEL
Cholesterol: 219 mg/dL — ABNORMAL HIGH (ref <200)
HDL: 41 mg/dL (ref >=40)
LDL Cholesterol (Calc): 150 mg/dL (calc) — ABNORMAL HIGH
Non-HDL Cholesterol (Calc): 178 mg/dL (calc) — ABNORMAL HIGH (ref <130)
Total CHOL/HDL Ratio: 5.3 (calc) — ABNORMAL HIGH (ref <5.0)
Triglycerides: 152 mg/dL — ABNORMAL HIGH (ref <150)

## 2023-01-17 LAB — HEMOGLOBIN A1C
Hgb A1c MFr Bld: 5.8 % of total Hgb — ABNORMAL HIGH (ref <5.7)
Mean Plasma Glucose: 120 mg/dL
eAG (mmol/L): 6.6 mmol/L

## 2023-01-18 ENCOUNTER — Other Ambulatory Visit: Payer: Self-pay | Admitting: Family Medicine

## 2023-01-18 DIAGNOSIS — E782 Mixed hyperlipidemia: Secondary | ICD-10-CM

## 2023-01-18 MED ORDER — ROSUVASTATIN CALCIUM 5 MG PO TABS
5.0000 mg | ORAL_TABLET | Freq: Every day | ORAL | 1 refills | Status: DC
Start: 2023-01-18 — End: 2023-07-18

## 2023-01-23 ENCOUNTER — Ambulatory Visit (INDEPENDENT_AMBULATORY_CARE_PROVIDER_SITE_OTHER): Payer: 59 | Admitting: Podiatry

## 2023-01-23 DIAGNOSIS — M19071 Primary osteoarthritis, right ankle and foot: Secondary | ICD-10-CM | POA: Diagnosis not present

## 2023-01-23 NOTE — Progress Notes (Signed)
  Subjective:  Patient ID: Miki L [place], male    DOB: [birth year],  MRN: [id number]  Chief Complaint  Patient presents with   Arthritis    Cortisone injection in right ankle and renew handicap placard    72 y.o. male presents with the above complaint. History confirmed with patient.  He returns for follow-up of his right ankle arthritis, injections have been helpful.  Objective:  Physical Exam: warm, good capillary refill, no trophic changes or ulcerative lesions, normal DP and PT pulses, normal sensory exam, and pain tenderness and edema around right ankle and subtalar joint.  Previous CT of the right lower extremity shows significant ankle and subtalar joint arthrosis  Assessment:   1. Osteoarthritis of right ankle and foot      Plan:  Patient was evaluated and treated and all questions answered.  So far has responded to injection therapy.  Last injection was July 2023.  He would like to continue with this treatment plan.  He may continue regular activity as tolerated.  Following consent and sterile prep with Betadine, the right ankle was injected from a medial approach with 20 mg of Kenalog and 1 cc of 0.5% Marcaine plain.  He tolerated this well.  It was dressed with a Band-Aid.  Return as needed when he needs further injections.  Return if symptoms worsen or fail to improve.

## 2023-02-05 ENCOUNTER — Ambulatory Visit (INDEPENDENT_AMBULATORY_CARE_PROVIDER_SITE_OTHER): Payer: 59 | Admitting: Urology

## 2023-02-05 ENCOUNTER — Encounter: Payer: Self-pay | Admitting: Urology

## 2023-02-05 VITALS — BP 145/77 | HR 59 | Ht 74.0 in | Wt 291.4 lb

## 2023-02-05 DIAGNOSIS — R972 Elevated prostate specific antigen [PSA]: Secondary | ICD-10-CM

## 2023-02-05 NOTE — Progress Notes (Signed)
I, Amy L Pierron, acting as a scribe for Vanna Scotland, MD.,have documented all relevant documentation on the behalf of Vanna Scotland, MD,as directed by  Vanna Scotland, MD while in the presence of Vanna Scotland, MD.  02/05/2023 9:18 AM   Warren [name] 18-Feb-1951 [id number]  Referring provider: Danelle Berry, PA-C [address] [place],  [place] [address]  Chief Complaint  Patient presents with   Elevated PSA    HPI: 72 year-old male with a personal history of elevated PSA returns today for a six month follow-up.  His PSA normally is in the 5-6 range. He had a negative prostate biopsy in 2018.   Six months ago, his PSA jumped to 8.6. His rectal exam remained unremarkable. He mentioned that he had his PSA drawn right after being diagnosed with COVID.   He has minimal urinary symptoms.   He uses Cialis 5 mg daily both for urinary control and erectile dysfunction.   He had a prostate MRI on August 22, 2022, which showed a 94 cc prostate, PI-RADS 1, not suspicious for malignancy.   His PSA was drawn today and awaiting result. He is doing well overall with no new symptoms or complaints.    PMH: Past Medical History:  Diagnosis Date   Elevated PSA 06/07/2017   6.2 -- refer to urologist Sept 2018   Erectile dysfunction 02/27/2016   Hypercholesterolemia without hypertriglyceridemia 10/08/2007   Hypertension    Malignant hyperthermia 1986   "told I had maybe a mild case of imalignant hyperthermia at [facility]"   Obesity 02/24/2015   OSA on CPAP 02/24/2015   CPAP q night    Osteoarthritis    "feet and knees"  (10/31/2016)   PIN (prostatic intraepithelial neoplasia) 09/05/2017   Prostate biopsy Nov 2018    Surgical History: Past Surgical History:  Procedure Laterality Date   COLONOSCOPY WITH PROPOFOL N/A 12/12/2022   Procedure: COLONOSCOPY WITH PROPOFOL;  Surgeon: Wyline Mood, MD;  Location: [facility] ENDOSCOPY;  Service: Gastroenterology;  Laterality: N/A;   HAMMER TOE  SURGERY Bilateral 11/2015 - 01/2016   "right-left"   JOINT REPLACEMENT     SOFT TISSUE BIOPSY Right ~ 1986   "@ [facility]"   TOTAL KNEE ARTHROPLASTY Right 08/22/2016   Procedure: TOTAL KNEE ARTHROPLASTY;  Surgeon: Loreta Ave, MD;  Location: [facility] OR;  Service: Orthopedics;  Laterality: Right;   TOTAL KNEE ARTHROPLASTY Left 10/31/2016   TOTAL KNEE ARTHROPLASTY Left 10/31/2016   Procedure: TOTAL KNEE ARTHROPLASTY;  Surgeon: Loreta Ave, MD;  Location: [facility] OR;  Service: Orthopedics;  Laterality: Left;    Home Medications:  Allergies as of 02/05/2023   No Known Allergies      Medication List        Accurate as of Feb 05, 2023  9:18 AM. If you have any questions, ask your nurse or doctor.          amLODipine 5 MG tablet Commonly known as: NORVASC TAKE 1 AND 1/2 TABLETS BY MOUTH EVERY DAY   aspirin EC 81 MG tablet Take 1 tablet (81 mg total) by mouth daily.   Fish Oil 1200 MG Caps Take 1,200 mg by mouth.   meloxicam 15 MG tablet Commonly known as: MOBIC TAKE 1 TABLET (15 MG TOTAL) BY MOUTH DAILY.   rosuvastatin 5 MG tablet Commonly known as: Crestor Take 1 tablet (5 mg total) by mouth daily.   tadalafil 5 MG tablet Commonly known as: CIALIS Take 1 tablet (5 mg total) by mouth  daily.   telmisartan-hydrochlorothiazide 80-25 MG tablet Commonly known as: MICARDIS HCT TAKE 1 TABLET BY MOUTH EVERY DAY FOR BLOOD PRESSURE        Family History: Family History  Problem Relation Age of Onset   Heart disease Mother    Stroke Mother    Suicidality Father    Cancer Sister        breast    Social History:  reports that he has never smoked. He has never used smokeless tobacco. He reports current alcohol use. He reports that he does not use drugs.   Physical Exam: BP (!) 145/77   Pulse (!) 59   Ht 6\' 2"  (1.88 m)   Wt 291 lb 6 oz (132.2 kg)   BMI 37.41 kg/m   Constitutional:  Alert and oriented, No acute distress. HEENT: [place] AT, moist mucus membranes.  Trachea  midline, no masses. Neurologic: Grossly intact, no focal deficits, moving all 4 extremities. Psychiatric: Normal mood and affect.   Assessment & Plan:    History of elevated PSA  - PSA drawn today, awaiting result. Will call him or send a MyChart message with number. If is back down to his previous range will see him again next year. If it is still elevated will have another visit, perhaps virtually, to discuss next steps.   Return in about 1 year (around 02/05/2024) for IPSS, PVR, PSA, DRE.  I have reviewed the above documentation for accuracy and completeness, and I agree with the above.   Vanna Scotland, MD   [facility] [address], [address] [place], [place] [address] [phone]

## 2023-02-06 LAB — PSA: Prostate Specific Ag, Serum: 9.4 ng/mL — ABNORMAL HIGH (ref 0.0–4.0)

## 2023-02-12 ENCOUNTER — Ambulatory Visit: Payer: Medicare Other | Admitting: Podiatry

## 2023-02-12 ENCOUNTER — Telehealth (INDEPENDENT_AMBULATORY_CARE_PROVIDER_SITE_OTHER): Payer: 59 | Admitting: Urology

## 2023-02-12 ENCOUNTER — Other Ambulatory Visit: Payer: Self-pay | Admitting: Podiatry

## 2023-02-12 DIAGNOSIS — R972 Elevated prostate specific antigen [PSA]: Secondary | ICD-10-CM

## 2023-02-12 DIAGNOSIS — Z87898 Personal history of other specified conditions: Secondary | ICD-10-CM

## 2023-02-12 NOTE — Addendum Note (Signed)
Addended by: Consuella Lose on: 02/12/2023 01:37 PM   Modules accepted: Orders

## 2023-02-12 NOTE — Progress Notes (Signed)
  I,Amy L Pierron,acting as a scribe for Vanna Scotland, MD.,have documented all relevant documentation on the behalf of Vanna Scotland, MD,as directed by  Vanna Scotland, MD while in the presence of Vanna Scotland, MD.  Virtual Visit via Video Note  I connected with Warren [name] on 02/12/2023 at  9:15 AM EDT by a video enabled telemedicine application and verified that I am speaking with the correct person using two identifiers.  Location: Patient: Warren [name] Provider: Dr. Apolinar Junes   I discussed the limitations of evaluation and management by telemedicine and the availability of in person appointments. The patient expressed understanding and agreed to proceed.  History of Present Illness: 72 year-old male with a personal history of elevated PSA reports virtually today for a follow-up since having his PSA drawn last week.  His PSA normally is in the 5-6 range. He had a negative prostate biopsy in 2018. Six months ago, his PSA jumped to 8.6. He had a prostate MRI on August 22, 2022, which showed a 94 cc prostate, PI-RADS 1.  His most recent PSA from 02/05/2023 was 9.4.  He is willing to try any supplements or behavior changes if there would be any that would help the prostate. He wants to have it rechecked again another time before jumping into getting the biopsy again. He won't ride a bike or ejaculate, or do anything to irritate the area for a few days before he gets his blood drawn.   Observations/Objective: He appears well.   Assessment and Plan: History of elevated PSA  - He's very hesitant to pursue another biopsy. However at this point, it is strongly recommended. He prefers to wait about six weeks. He's gonna avoid sexual activity or anything that may mechanically irritate his prostate prior to his blood draw. Will decide how to proceed depending on the results.   Follow Up Instructions: F/u PSA in 6 weeks   I discussed the assessment and treatment plan with the patient. The  patient was provided an opportunity to ask questions and all were answered. The patient agreed with the plan and demonstrated an understanding of the instructions.   The patient was advised to call back or seek an in-person evaluation if the symptoms worsen or if the condition fails to improve as anticipated.  I provided 13 minutes of non-face-to-face time during this encounter.

## 2023-02-18 ENCOUNTER — Other Ambulatory Visit: Payer: Self-pay | Admitting: Family Medicine

## 2023-02-18 DIAGNOSIS — I10 Essential (primary) hypertension: Secondary | ICD-10-CM

## 2023-02-19 ENCOUNTER — Ambulatory Visit: Payer: Medicare Other | Admitting: Podiatry

## 2023-03-07 ENCOUNTER — Other Ambulatory Visit: Payer: Self-pay | Admitting: Podiatry

## 2023-03-25 ENCOUNTER — Other Ambulatory Visit: Payer: 59

## 2023-03-25 DIAGNOSIS — R972 Elevated prostate specific antigen [PSA]: Secondary | ICD-10-CM | POA: Diagnosis not present

## 2023-03-27 LAB — PSA: Prostate Specific Ag, Serum: 9.1 ng/mL — ABNORMAL HIGH (ref 0.0–4.0)

## 2023-04-07 ENCOUNTER — Other Ambulatory Visit: Payer: Self-pay | Admitting: Podiatry

## 2023-05-06 ENCOUNTER — Other Ambulatory Visit: Payer: Self-pay | Admitting: Podiatry

## 2023-05-17 ENCOUNTER — Other Ambulatory Visit: Payer: Self-pay | Admitting: Family Medicine

## 2023-05-17 DIAGNOSIS — I10 Essential (primary) hypertension: Secondary | ICD-10-CM

## 2023-05-30 ENCOUNTER — Encounter: Payer: Self-pay | Admitting: Podiatry

## 2023-05-30 ENCOUNTER — Ambulatory Visit (INDEPENDENT_AMBULATORY_CARE_PROVIDER_SITE_OTHER): Payer: 59 | Admitting: Podiatry

## 2023-05-30 DIAGNOSIS — M19071 Primary osteoarthritis, right ankle and foot: Secondary | ICD-10-CM

## 2023-05-30 DIAGNOSIS — M7751 Other enthesopathy of right foot: Secondary | ICD-10-CM

## 2023-05-30 MED ORDER — TRIAMCINOLONE ACETONIDE 40 MG/ML IJ SUSP
20.0000 mg | Freq: Once | INTRAMUSCULAR | Status: AC
Start: 2023-05-30 — End: 2023-05-30
  Administered 2023-05-30: 20 mg

## 2023-06-02 NOTE — Progress Notes (Signed)
He presents today for follow-up of osteoarthritis of his right ankle states that it flares up sometimes and hurts in the morning a lot more.  Objective: Vital signs are stable he is alert and oriented x 3 right ankle does demonstrate some hypertrophic bone growth with pain on palpation of the medial gutter and the medial shoulder.  There is some crepitation on range of motion.  Assessment: Osteoarthritis with capsulitis of the ankle joint.  Plan: I injected the area today Abstral Betadine skin prep 20 mg of Kenalog into the ankle joint.  He tolerated procedure well without complications.  Follow-up with Merlyn Albert on an as-needed basis.  Briefly discussed surgical intervention consisting of possible ankle fusion or ankle joint replacement.

## 2023-06-04 ENCOUNTER — Other Ambulatory Visit: Payer: Self-pay | Admitting: Podiatry

## 2023-07-03 ENCOUNTER — Other Ambulatory Visit: Payer: Self-pay | Admitting: Podiatry

## 2023-07-18 ENCOUNTER — Other Ambulatory Visit: Payer: Self-pay | Admitting: Family Medicine

## 2023-07-18 DIAGNOSIS — E782 Mixed hyperlipidemia: Secondary | ICD-10-CM

## 2023-07-22 ENCOUNTER — Ambulatory Visit: Payer: 59 | Admitting: Family Medicine

## 2023-07-23 ENCOUNTER — Ambulatory Visit (INDEPENDENT_AMBULATORY_CARE_PROVIDER_SITE_OTHER): Payer: 59 | Admitting: Physician Assistant

## 2023-07-23 ENCOUNTER — Encounter: Payer: Self-pay | Admitting: Physician Assistant

## 2023-07-23 VITALS — BP 130/82 | HR 71 | Temp 98.0°F | Resp 16 | Ht 74.0 in | Wt 286.6 lb

## 2023-07-23 DIAGNOSIS — E66812 Obesity, class 2: Secondary | ICD-10-CM | POA: Diagnosis not present

## 2023-07-23 DIAGNOSIS — R972 Elevated prostate specific antigen [PSA]: Secondary | ICD-10-CM | POA: Diagnosis not present

## 2023-07-23 DIAGNOSIS — G4733 Obstructive sleep apnea (adult) (pediatric): Secondary | ICD-10-CM | POA: Diagnosis not present

## 2023-07-23 DIAGNOSIS — I10 Essential (primary) hypertension: Secondary | ICD-10-CM | POA: Diagnosis not present

## 2023-07-23 DIAGNOSIS — Z6838 Body mass index (BMI) 38.0-38.9, adult: Secondary | ICD-10-CM | POA: Diagnosis not present

## 2023-07-23 DIAGNOSIS — E785 Hyperlipidemia, unspecified: Secondary | ICD-10-CM | POA: Diagnosis not present

## 2023-07-23 MED ORDER — AMLODIPINE BESYLATE 5 MG PO TABS: 7.5000 mg | ORAL_TABLET | Freq: Every day | ORAL | 1 refills | Status: DC

## 2023-07-23 MED ORDER — TELMISARTAN-HCTZ 80-25 MG PO TABS: 1.0000 | ORAL_TABLET | Freq: Every day | ORAL | 1 refills | Status: DC

## 2023-07-23 NOTE — Progress Notes (Signed)
Established Patient Office Visit  Name: Warren [name]   MRN: [id number]    DOB: 1951/09/10   Date:07/23/2023  Today's Provider: Jacquelin Hawking, MHS, PA-C Introduced myself to the patient as a PA-C and provided education on APPs in clinical practice.         Subjective  Chief Complaint  Chief Complaint  Patient presents with   Hypertension   Hyperlipidemia    HPI  HYPERTENSION / HYPERLIPIDEMIA Satisfied with current treatment? yes Duration of hypertension: years BP monitoring frequency: a few times a week BP range: 130s-140s/70s-80s - reports he is usually in 130s/70s at home  BP medication side effects: no Past BP meds: amlodipine 7.5 mg PO every day, Telmisartan-hydrochlorothiazide 80-25 mg PO every day  Duration of hyperlipidemia: years Cholesterol medication side effects: no Cholesterol supplements: fish oil Past cholesterol medications: rosuvastatin (crestor) Medication compliance: good compliance Aspirin: yes Recent stressors: no Recurrent headaches: no Visual changes: no Palpitations: no Dyspnea: no Chest pain: no Lower extremity edema: no Dizzy/lightheaded: no Exercise: He is usually working on golf course or playing golf almost every day   Elevated PSA  Followed by urology  Plan is to follow up in about a year- reports they are monitoring for now    He does reports osteoarthritis in his ankles and knees  He reports ongoing pain in his ankles and feet - he is getting injections in ankles with Podiatry  He reports he is using golf cart more now rather than walking golf course as he would like     OSA He is using CPAP machine  He reports sleep is good but he has not been able to go back for follow up due to previous provider being out of network now He would like to see a new provider and get re-established so he can get supplies for his machine He was seeing [facility] but they are now out of network    Patient Active Problem List    Diagnosis Date Noted   OSA on CPAP 10/31/2020   PIN (prostatic intraepithelial neoplasia) 09/05/2017   Colon cancer screening 07/03/2017   Elevated PSA 06/07/2017   Erectile dysfunction 02/27/2016   Class 2 severe obesity due to excess calories with serious comorbidity and body mass index (BMI) of 38.0 to 38.9 in adult (HCC) 02/24/2015   Allergic rhinitis 01/12/2008   Hyperlipidemia 10/08/2007   Benign essential HTN 08/13/2007    Past Surgical History:  Procedure Laterality Date   COLONOSCOPY WITH PROPOFOL N/A 12/12/2022   Procedure: COLONOSCOPY WITH PROPOFOL;  Surgeon: Wyline Mood, MD;  Location: [facility] ENDOSCOPY;  Service: Gastroenterology;  Laterality: N/A;   HAMMER TOE SURGERY Bilateral 11/2015 - 01/2016   "right-left"   JOINT REPLACEMENT     SOFT TISSUE BIOPSY Right ~ 1986   "@ [facility]"   TOTAL KNEE ARTHROPLASTY Right 08/22/2016   Procedure: TOTAL KNEE ARTHROPLASTY;  Surgeon: Loreta Ave, MD;  Location: [facility] OR;  Service: Orthopedics;  Laterality: Right;   TOTAL KNEE ARTHROPLASTY Left 10/31/2016   TOTAL KNEE ARTHROPLASTY Left 10/31/2016   Procedure: TOTAL KNEE ARTHROPLASTY;  Surgeon: Loreta Ave, MD;  Location: [facility] OR;  Service: Orthopedics;  Laterality: Left;    Family History  Problem Relation Age of Onset   Heart disease Mother    Stroke Mother    Suicidality Father    Cancer Sister        breast    Social History  Tobacco Use   Smoking status: Never   Smokeless tobacco: Never  Substance Use Topics   Alcohol use: Yes    Alcohol/week: 0.0 standard drinks of alcohol    Comment: 10/31/2016 "wine a couple times/month"     Current Outpatient Medications:    aspirin EC 81 MG tablet, Take 1 tablet (81 mg total) by mouth daily., Disp: , Rfl:    meloxicam (MOBIC) 15 MG tablet, TAKE 1 TABLET (15 MG TOTAL) BY MOUTH DAILY., Disp: 30 tablet, Rfl: 0   Omega-3 Fatty Acids (FISH OIL) 1200 MG CAPS, Take 1,200 mg by mouth., Disp: , Rfl:    rosuvastatin (CRESTOR) 5 MG tablet,  TAKE 1 TABLET (5 MG TOTAL) BY MOUTH DAILY., Disp: 90 tablet, Rfl: 1   tadalafil (CIALIS) 5 MG tablet, Take 1 tablet (5 mg total) by mouth daily., Disp: 90 tablet, Rfl: 3   amLODipine (NORVASC) 5 MG tablet, Take 1.5 tablets (7.5 mg total) by mouth daily. TAKE 1 AND 1/2 TABLETS DAILY BY MOUTH, Disp: 135 tablet, Rfl: 1   telmisartan-hydrochlorothiazide (MICARDIS HCT) 80-25 MG tablet, Take 1 tablet by mouth daily. TAKE 1 TABLET BY MOUTH EVERY DAY FOR BLOOD PRESSURE, Disp: 90 tablet, Rfl: 1  No Known Allergies  I personally reviewed active problem list, medication list, health maintenance, notes from last encounter, lab results with the patient/caregiver today.   Review of Systems  Eyes:  Negative for blurred vision and double vision.  Respiratory:  Negative for shortness of breath and wheezing.   Cardiovascular:  Negative for chest pain, palpitations and leg swelling.  Gastrointestinal:  Negative for blood in stool, constipation, diarrhea, heartburn, nausea and vomiting.  Genitourinary:  Negative for dysuria, frequency and urgency.  Musculoskeletal:  Positive for joint pain and myalgias. Negative for falls.  Neurological:  Negative for dizziness, loss of consciousness and headaches.      Objective  Vitals:   07/23/23 0809  BP: 130/82  Pulse: 71  Resp: 16  Temp: 98 F (36.7 C)  TempSrc: Oral  SpO2: 95%  Weight: 286 lb 9.6 oz (130 kg)  Height: 6\' 2"  (1.88 m)    Body mass index is 36.8 kg/m.  Physical Exam Vitals reviewed.  Constitutional:      General: He is awake.     Appearance: Normal appearance. He is well-developed and well-groomed.  HENT:     Head: Normocephalic and atraumatic.  Cardiovascular:     Rate and Rhythm: Normal rate and regular rhythm.     Pulses: Normal pulses.          Radial pulses are 2+ on the right side and 2+ on the left side.     Heart sounds: Normal heart sounds. No murmur heard.    No friction rub. No gallop.  Pulmonary:     Effort: Pulmonary  effort is normal.     Breath sounds: Normal breath sounds. No decreased air movement. No decreased breath sounds, wheezing, rhonchi or rales.  Musculoskeletal:     Cervical back: Normal range of motion.     Right lower leg: No edema.     Left lower leg: No edema.  Neurological:     General: No focal deficit present.     Mental Status: He is alert and oriented to person, place, and time. Mental status is at baseline.     GCS: GCS eye subscore is 4. GCS verbal subscore is 5. GCS motor subscore is 6.  Psychiatric:        Attention and  Perception: Attention and perception normal.        Mood and Affect: Mood and affect normal.        Speech: Speech normal.        Behavior: Behavior normal. Behavior is cooperative.        Thought Content: Thought content normal.        Cognition and Memory: Cognition normal.      No results found for this or any previous visit (from the past 2160 hour(s)).   PHQ2/9:    07/23/2023    8:09 AM 01/16/2023    8:35 AM 11/22/2022    8:19 AM 08/13/2022    3:28 PM 02/08/2022    2:46 PM  Depression screen PHQ 2/9  Decreased Interest 0 0 0 0 0  Down, Depressed, Hopeless 0 0 0 0 0  PHQ - 2 Score 0 0 0 0 0  Altered sleeping 0 0  0 0  Tired, decreased energy 0 0  0 0  Change in appetite 0 0  0 0  Feeling bad or failure about yourself  0 0  0 0  Trouble concentrating 0 0  0 0  Moving slowly or fidgety/restless 0 0  0 0  Suicidal thoughts 0 0  0 0  PHQ-9 Score 0 0  0 0  Difficult doing work/chores Not difficult at all   Not difficult at all       Fall Risk:    07/23/2023    8:08 AM 01/16/2023    8:35 AM 11/22/2022    8:16 AM 08/13/2022    3:28 PM 02/08/2022    2:46 PM  Fall Risk   Falls in the past year? 0 0 0 0 0  Number falls in past yr: 0 0 0 0   Injury with Fall? 0 0 0 0   Risk for fall due to : No Fall Risks No Fall Risks No Fall Risks No Fall Risks No Fall Risks  Follow up Falls prevention discussed;Education provided;Falls evaluation completed  Falls prevention discussed Education provided;Falls prevention discussed Falls prevention discussed;Falls evaluation completed;Education provided Falls prevention discussed      Functional Status Survey: Is the patient deaf or have difficulty hearing?: No Does the patient have difficulty seeing, even when wearing glasses/contacts?: No Does the patient have difficulty concentrating, remembering, or making decisions?: No Does the patient have difficulty walking or climbing stairs?: No Does the patient have difficulty dressing or bathing?: No Does the patient have difficulty doing errands alone such as visiting a doctor's office or shopping?: No    Assessment & Plan  Problem List Items Addressed This Visit       Cardiovascular and Mediastinum   Benign essential HTN - Primary (Chronic)    Chronic, historic condition BP is well controlled with current regimen comprised of amlodipine 7.5 mg PO every day, Telmisartan-hydrochlorothiazide 80-25 mg PO every day  Continue current regimen Continue exercise and dietary efforts Follow up in 6 months or sooner if concerns arise        Relevant Medications   amLODipine (NORVASC) 5 MG tablet   telmisartan-hydrochlorothiazide (MICARDIS HCT) 80-25 MG tablet   Other Relevant Orders   COMPLETE METABOLIC PANEL WITH GFR   CBC w/Diff/Platelet     Respiratory   OSA on CPAP    Chronic, historic, He reports good rest with CPAP but needs new supply provider  Recommend he reaches out to [facility] who provided sleep testing for assistance with finding new Advice worker-  will place referral if needed  Continue current CPAP use as directed  Follow up as needed for persistent or progressing symptoms          Other   Hyperlipidemia (Chronic)    Chronic, historic condition Currently managed with Rosuvastatin 5 mg PO every day and fish oil  Continue current regimen Recheck Lipid panel today for monitoring Follow up in 6 months or sooner if  concerns arise        Relevant Medications   amLODipine (NORVASC) 5 MG tablet   telmisartan-hydrochlorothiazide (MICARDIS HCT) 80-25 MG tablet   Other Relevant Orders   Lipid Profile   Class 2 severe obesity due to excess calories with serious comorbidity and body mass index (BMI) of 38.0 to 38.9 in adult [facility])    Chronic, ongoing He is active almost every day with work and playing golf Recommend he continues with activity levels and follows dietary measures for HTN and HLD Follow up in 6 months or sooner if concerns arise        Relevant Orders   HgB A1c   Elevated PSA    Chronic, ongoing Currently followed by [facility] They are monitoring for now - next apt is in 2025  Will defer to their recommendations         Return in about 6 months (around 01/20/2024) for HTN, HLD.   I, Sindy Mccune E Jadia Capers, PA-C, have reviewed all documentation for this visit. The documentation on 07/23/23 for the exam, diagnosis, procedures, and orders are all accurate and complete.   Jacquelin Hawking, MHS, PA-C [facility]

## 2023-07-23 NOTE — Assessment & Plan Note (Signed)
Chronic, historic condition BP is well controlled with current regimen comprised of amlodipine 7.5 mg PO every day, Telmisartan-hydrochlorothiazide 80-25 mg PO every day  Continue current regimen Continue exercise and dietary efforts Follow up in 6 months or sooner if concerns arise

## 2023-07-23 NOTE — Assessment & Plan Note (Signed)
Chronic, historic condition Currently managed with Rosuvastatin 5 mg PO every day and fish oil  Continue current regimen Recheck Lipid panel today for monitoring Follow up in 6 months or sooner if concerns arise

## 2023-07-23 NOTE — Assessment & Plan Note (Signed)
Chronic, ongoing He is active almost every day with work and playing golf Recommend he continues with activity levels and follows dietary measures for HTN and HLD Follow up in 6 months or sooner if concerns arise

## 2023-07-23 NOTE — Assessment & Plan Note (Signed)
Chronic, ongoing Currently followed by [facility] They are monitoring for now - next apt is in 2025  Will defer to their recommendations

## 2023-07-23 NOTE — Assessment & Plan Note (Signed)
Chronic, historic, He reports good rest with CPAP but needs new supply provider  Recommend he reaches out to [facility] who provided sleep testing for assistance with finding new Advice worker- will place referral if needed  Continue current CPAP use as directed  Follow up as needed for persistent or progressing symptoms

## 2023-07-24 LAB — LIPID PANEL
Cholesterol: 169 mg/dL (ref <200)
HDL: 43 mg/dL (ref >=40)
LDL Cholesterol (Calc): 93 mg/dL
Non-HDL Cholesterol (Calc): 126 mg/dL (ref <130)
Total CHOL/HDL Ratio: 3.9 (calc) (ref <5.0)
Triglycerides: 236 mg/dL — ABNORMAL HIGH (ref <150)

## 2023-07-24 LAB — HEMOGLOBIN A1C
Hgb A1c MFr Bld: 5.8 %{Hb} — ABNORMAL HIGH (ref <5.7)
Mean Plasma Glucose: 120 mg/dL
eAG (mmol/L): 6.6 mmol/L

## 2023-07-24 LAB — COMPLETE METABOLIC PANEL WITH GFR
AG Ratio: 1.6 (calc) (ref 1.0–2.5)
ALT: 30 U/L (ref 9–46)
AST: 26 U/L (ref 10–35)
Albumin: 4.3 g/dL (ref 3.6–5.1)
Alkaline phosphatase (APISO): 48 U/L (ref 35–144)
BUN: 21 mg/dL (ref 7–25)
CO2: 31 mmol/L (ref 20–32)
Calcium: 9.7 mg/dL (ref 8.6–10.3)
Chloride: 103 mmol/L (ref 98–110)
Creat: 1.13 mg/dL (ref 0.70–1.28)
Globulin: 2.7 g/dL (ref 1.9–3.7)
Glucose, Bld: 78 mg/dL (ref 65–99)
Potassium: 4 mmol/L (ref 3.5–5.3)
Sodium: 142 mmol/L (ref 135–146)
Total Bilirubin: 1 mg/dL (ref 0.2–1.2)
Total Protein: 7 g/dL (ref 6.1–8.1)
eGFR: 70 mL/min/{1.73_m2} (ref >=60)

## 2023-07-24 LAB — CBC WITH DIFFERENTIAL/PLATELET
Absolute Lymphocytes: 2033 {cells}/uL (ref 850–3900)
Absolute Monocytes: 612 {cells}/uL (ref 200–950)
Basophils Absolute: 48 {cells}/uL (ref 0–200)
Basophils Relative: 0.7 %
Eosinophils Absolute: 394 {cells}/uL (ref 15–500)
Eosinophils Relative: 5.8 %
HCT: 46 % (ref 38.5–50.0)
Hemoglobin: 15.5 g/dL (ref 13.2–17.1)
MCH: 30.2 pg (ref 27.0–33.0)
MCHC: 33.7 g/dL (ref 32.0–36.0)
MCV: 89.5 fL (ref 80.0–100.0)
MPV: 11.3 fL (ref 7.5–12.5)
Monocytes Relative: 9 %
Neutro Abs: 3713 {cells}/uL (ref 1500–7800)
Neutrophils Relative %: 54.6 %
Platelets: 254 10*3/uL (ref 140–400)
RBC: 5.14 10*6/uL (ref 4.20–5.80)
RDW: 12.5 % (ref 11.0–15.0)
Total Lymphocyte: 29.9 %
WBC: 6.8 10*3/uL (ref 3.8–10.8)

## 2023-07-25 NOTE — Progress Notes (Signed)
Your labs are back Your electrolytes, liver and kidney function were overall normal at this time Your A1c was stable at 5.8% which is good and in the prediabetic range Your CBC was normal- no signs of anemia Your cholesterol looks good- there was some elevation with your triglycerides but if you ate prior to labdraw this is likely the cause Please continue your current medications as directed

## 2023-08-02 ENCOUNTER — Other Ambulatory Visit: Payer: Self-pay | Admitting: Urology

## 2023-08-09 ENCOUNTER — Other Ambulatory Visit: Payer: Self-pay | Admitting: Podiatry

## 2023-08-16 NOTE — Progress Notes (Signed)
[facility] [address] [place], [place] [address]  Pulmonary Sleep Medicine   Office Visit Note  Patient Name: Warren [name] DOB: 1951/08/19 MRN [id number]    Chief Complaint: Obstructive Sleep Apnea visit  Brief History:  Warren [name] is seen today for a follow up visit for CPAP@ 8 cmH2O. The patient has a 8 year history of sleep apnea. Patient is using PAP nightly.  The patient feels rested after sleeping with PAP.  The patient reports benefiting from PAP use. Reported sleepiness is  improved and the Epworth Sleepiness Score is 4 out of 24. The patient does not take naps. The patient complains of the following: pt is in need of supplies.  The compliance download shows 100% compliance with an average use time of 6 hours 55 minutes. The AHI is 0.4.  The patient does not complain of limb movements disrupting sleep. The patient continues to require PAP therapy in order to eliminate sleep apnea.   ROS  General: (-) fever, (-) chills, (-) night sweat Nose and Sinuses: (-) nasal stuffiness or itchiness, (-) postnasal drip, (-) nosebleeds, (-) sinus trouble. Mouth and Throat: (-) sore throat, (-) hoarseness. Neck: (-) swollen glands, (-) enlarged thyroid, (-) neck pain. Respiratory: - cough, - shortness of breath, - wheezing. Neurologic: - numbness, - tingling. Psychiatric: - anxiety, - depression   Current Medication: Outpatient Encounter Medications as of 08/19/2023  Medication Sig   amLODipine (NORVASC) 5 MG tablet Take 1.5 tablets (7.5 mg total) by mouth daily. TAKE 1 AND 1/2 TABLETS DAILY BY MOUTH   aspirin EC 81 MG tablet Take 1 tablet (81 mg total) by mouth daily.   meloxicam (MOBIC) 15 MG tablet TAKE 1 TABLET (15 MG TOTAL) BY MOUTH DAILY.   Omega-3 Fatty Acids (FISH OIL) 1200 MG CAPS Take 1,200 mg by mouth.   rosuvastatin (CRESTOR) 5 MG tablet TAKE 1 TABLET (5 MG TOTAL) BY MOUTH DAILY.   tadalafil (CIALIS) 5 MG tablet TAKE 1 TABLET (5 MG TOTAL) BY MOUTH DAILY.    telmisartan-hydrochlorothiazide (MICARDIS HCT) 80-25 MG tablet Take 1 tablet by mouth daily. TAKE 1 TABLET BY MOUTH EVERY DAY FOR BLOOD PRESSURE   No facility-administered encounter medications on file as of 08/19/2023.    Surgical History: Past Surgical History:  Procedure Laterality Date   COLONOSCOPY WITH PROPOFOL N/A 12/12/2022   Procedure: COLONOSCOPY WITH PROPOFOL;  Surgeon: Wyline Mood, MD;  Location: [facility] ENDOSCOPY;  Service: Gastroenterology;  Laterality: N/A;   HAMMER TOE SURGERY Bilateral 11/2015 - 01/2016   "right-left"   JOINT REPLACEMENT     SOFT TISSUE BIOPSY Right ~ 1986   "@ [facility]"   TOTAL KNEE ARTHROPLASTY Right 08/22/2016   Procedure: TOTAL KNEE ARTHROPLASTY;  Surgeon: Loreta Ave, MD;  Location: [facility] OR;  Service: Orthopedics;  Laterality: Right;   TOTAL KNEE ARTHROPLASTY Left 10/31/2016   TOTAL KNEE ARTHROPLASTY Left 10/31/2016   Procedure: TOTAL KNEE ARTHROPLASTY;  Surgeon: Loreta Ave, MD;  Location: [facility] OR;  Service: Orthopedics;  Laterality: Left;    Medical History: Past Medical History:  Diagnosis Date   Elevated PSA 06/07/2017   6.2 -- refer to urologist Sept 2018   Erectile dysfunction 02/27/2016   Hypercholesterolemia without hypertriglyceridemia 10/08/2007   Hypertension    Malignant hyperthermia 1986   "told I had maybe a mild case of imalignant hyperthermia at [facility]"   Obesity 02/24/2015   OSA on CPAP 02/24/2015   CPAP q night    Osteoarthritis    "feet and  knees"  (10/31/2016)   PIN (prostatic intraepithelial neoplasia) 09/05/2017   Prostate biopsy Nov 2018    Family History: Non contributory to the present illness  Social History: Social History   Socioeconomic History   Marital status: Married    Spouse name: Not on file   Number of children: 2   Years of education: Not on file   Highest education level: Not on file  Occupational History   Not on file  Tobacco Use   Smoking status: Never   Smokeless tobacco: Never  Vaping Use    Vaping status: Never Used  Substance and Sexual Activity   Alcohol use: Yes    Alcohol/week: 0.0 standard drinks of alcohol    Comment: 10/31/2016 "wine a couple times/month"   Drug use: No   Sexual activity: Yes    Partners: Female  Other Topics Concern   Not on file  Social History Narrative   Not on file   Social Determinants of Health   Financial Resource Strain: Low Risk  (11/22/2022)   Overall Financial Resource Strain (CARDIA)    Difficulty of Paying Living Expenses: Not hard at all  Food Insecurity: No Food Insecurity (11/22/2022)   Hunger Vital Sign    Worried About Running Out of Food in the Last Year: Never true    Ran Out of Food in the Last Year: Never true  Transportation Needs: No Transportation Needs (11/22/2022)   PRAPARE - Administrator, Civil Service (Medical): No    Lack of Transportation (Non-Medical): No  Physical Activity: Sufficiently Active (11/22/2022)   Exercise Vital Sign    Days of Exercise per Week: 5 days    Minutes of Exercise per Session: 150+ min  Stress: No Stress Concern Present (11/22/2022)   [employer] of Occupational Health - Occupational Stress Questionnaire    Feeling of Stress : Not at all  Social Connections: Socially Integrated (11/22/2022)   Social Connection and Isolation Panel [NHANES]    Frequency of Communication with Friends and Family: More than three times a week    Frequency of Social Gatherings with Friends and Family: More than three times a week    Attends Religious Services: More than 4 times per year    Active Member of [employer] or Organizations: Yes    Attends Engineer, structural: More than 4 times per year    Marital Status: Married  Catering manager Violence: Not At Risk (11/22/2022)   Humiliation, Afraid, Rape, and Kick questionnaire    Fear of Current or Ex-Partner: No    Emotionally Abused: No    Physically Abused: No    Sexually Abused: No    Vital Signs: Blood pressure (!) 152/87,  pulse 73, resp. rate 16, height 6\' 2"  (1.88 m), weight 292 lb (132.5 kg), SpO2 98%. Body mass index is 37.49 kg/m.    Examination: General Appearance: The patient is well-developed, well-nourished, and in no distress. Neck Circumference: 44 cm Skin: Gross inspection of skin unremarkable. Head: normocephalic, no gross deformities. Eyes: no gross deformities noted. ENT: ears appear grossly normal Neurologic: Alert and oriented. No involuntary movements.  STOP BANG RISK ASSESSMENT S (snore) Have you been told that you snore?     NO   T (tired) Are you often tired, fatigued, or sleepy during the day?   NO  O (obstruction) Do you stop breathing, choke, or gasp during sleep? NO   P (pressure) Do you have or are you being treated for high blood  pressure? YES   B (BMI) Is your body index greater than 35 kg/m? YES   A (age) Are you 49 years old or older? YES   N (neck) Do you have a neck circumference greater than 16 inches?   YES   G (gender) Are you a male? YES   TOTAL STOP/BANG "YES" ANSWERS 5       A STOP-Bang score of 2 or less is considered low risk, and a score of 5 or more is high risk for having either moderate or severe OSA. For people who score 3 or 4, doctors may need to perform further assessment to determine how likely they are to have OSA.         EPWORTH SLEEPINESS SCALE:  Scale:  (0)= no chance of dozing; (1)= slight chance of dozing; (2)= moderate chance of dozing; (3)= high chance of dozing  Chance  Situtation    Sitting and reading: 1    Watching TV: 1    Sitting Inactive in public: 0    As a passenger in car: 1      Lying down to rest: 1    Sitting and talking: 0    Sitting quielty after lunch: 0    In a car, stopped in traffic: 0   TOTAL SCORE:   4 out of 24    SLEEP STUDIES:  Split Study (06/2014) AHI 17.4/hr, min Spo2 87%, CPAP@ 8 cmH2O    CPAP COMPLIANCE DATA:  Date Range: 12/70/2023-08/15/2023  Average Daily Use: 6 hours 55  minutes  Median Use: 6 hours 50 minutes  Compliance for > 4 Hours: 100%  AHI: 0.4 respiratory events per hour  Days Used: 365/365 days  Mask Leak: 26.6  95th Percentile Pressure: 8         LABS: Recent Results (from the past 2160 hour(s))  Lipid Profile     Status: Abnormal   Collection Time: 07/23/23  8:55 AM  Result Value Ref Range   Cholesterol 169 <200 mg/dL   HDL 43 > OR = 40 mg/dL   Triglycerides 161 (H) <150 mg/dL    Comment: . If a non-fasting specimen was collected, consider repeat triglyceride testing on a fasting specimen if clinically indicated.  Perry Mount et al. J. of Clin. Lipidol. 2015;9:129-169. Marland Kitchen    LDL Cholesterol (Calc) 93 mg/dL (calc)    Comment: Reference range: <100 . Desirable range <100 mg/dL for primary prevention;   <70 mg/dL for patients with CHD or diabetic patients  with > or = 2 CHD risk factors. Marland Kitchen LDL-C is now calculated using the Martin-Hopkins  calculation, which is a validated novel method providing  better accuracy than the Friedewald equation in the  estimation of LDL-C.  Horald Pollen et al. Lenox Ahr. 0960;454(09): 2061-2068  (http://education.[facility].com/faq/FAQ164)    Total CHOL/HDL Ratio 3.9 <5.0 (calc)   Non-HDL Cholesterol (Calc) 126 <130 mg/dL (calc)    Comment: For patients with diabetes plus 1 major ASCVD risk  factor, treating to a non-HDL-C goal of <100 mg/dL  (LDL-C of <81 mg/dL) is considered a therapeutic  option.   COMPLETE METABOLIC PANEL WITH GFR     Status: None   Collection Time: 07/23/23  8:55 AM  Result Value Ref Range   Glucose, Bld 78 65 - 99 mg/dL    Comment: .            Fasting reference interval .    BUN 21 7 - 25 mg/dL   Creat 1.91 4.78 - 2.95  mg/dL   eGFR 70 > OR = 60 ZO/XWR/6.04V4   BUN/Creatinine Ratio SEE NOTE: 6 - 22 (calc)    Comment:    Not Reported: BUN and Creatinine are within    reference range. .    Sodium 142 135 - 146 mmol/L   Potassium 4.0 3.5 - 5.3 mmol/L    Chloride 103 98 - 110 mmol/L   CO2 31 20 - 32 mmol/L   Calcium 9.7 8.6 - 10.3 mg/dL   Total Protein 7.0 6.1 - 8.1 g/dL   Albumin 4.3 3.6 - 5.1 g/dL   Globulin 2.7 1.9 - 3.7 g/dL (calc)   AG Ratio 1.6 1.0 - 2.5 (calc)   Total Bilirubin 1.0 0.2 - 1.2 mg/dL   Alkaline phosphatase (APISO) 48 35 - 144 U/L   AST 26 10 - 35 U/L   ALT 30 9 - 46 U/L  CBC w/Diff/Platelet     Status: None   Collection Time: 07/23/23  8:55 AM  Result Value Ref Range   WBC 6.8 3.8 - 10.8 Thousand/uL   RBC 5.14 4.20 - 5.80 Million/uL   Hemoglobin 15.5 13.2 - 17.1 g/dL   HCT 09.8 11.9 - 14.7 %   MCV 89.5 80.0 - 100.0 fL   MCH 30.2 27.0 - 33.0 pg   MCHC 33.7 32.0 - 36.0 g/dL    Comment: For adults, a slight decrease in the calculated MCHC value (in the range of 30 to 32 g/dL) is most likely not clinically significant; however, it should be interpreted with caution in correlation with other red cell parameters and the patient's clinical condition.    RDW 12.5 11.0 - 15.0 %   Platelets 254 140 - 400 Thousand/uL   MPV 11.3 7.5 - 12.5 fL   Neutro Abs 3,713 1,500 - 7,800 cells/uL   Absolute Lymphocytes 2,033 850 - 3,900 cells/uL   Absolute Monocytes 612 200 - 950 cells/uL   Eosinophils Absolute 394 15 - 500 cells/uL   Basophils Absolute 48 0 - 200 cells/uL   Neutrophils Relative % 54.6 %   Total Lymphocyte 29.9 %   Monocytes Relative 9.0 %   Eosinophils Relative 5.8 %   Basophils Relative 0.7 %  HgB A1c     Status: Abnormal   Collection Time: 07/23/23  8:55 AM  Result Value Ref Range   Hgb A1c MFr Bld 5.8 (H) <5.7 % of total Hgb    Comment: For someone without known diabetes, a hemoglobin  A1c value between 5.7% and 6.4% is consistent with prediabetes and should be confirmed with a  follow-up test. . For someone with known diabetes, a value <7% indicates that their diabetes is well controlled. A1c targets should be individualized based on duration of diabetes, age, comorbid conditions, and  other considerations. . This assay result is consistent with an increased risk of diabetes. . Currently, no consensus exists regarding use of hemoglobin A1c for diagnosis of diabetes for children. .    Mean Plasma Glucose 120 mg/dL   eAG (mmol/L) 6.6 mmol/L    Radiology: No results found.  No results found.  No results found.    Assessment and Plan: Patient Active Problem List   Diagnosis Date Noted   OSA on CPAP 10/31/2020   PIN (prostatic intraepithelial neoplasia) 09/05/2017   Colon cancer screening 07/03/2017   Elevated PSA 06/07/2017   Erectile dysfunction 02/27/2016   Class 2 severe obesity due to excess calories with serious comorbidity and body mass index (BMI) of 38.0 to  38.9 in adult [facility]) 02/24/2015   Allergic rhinitis 01/12/2008   Hyperlipidemia 10/08/2007   Benign essential HTN 08/13/2007   1. OSA on CPAP The patient does tolerate PAP and reports  benefit from PAP use. The patient was reminded how to clean equipment and advised to replace supplies routinely. The patient was also counselled on weight loss. The compliance is excellent. The AHI is 0.4.   OSA on cpap- controlled. Continue with excellent compliance with pap. CPAP continues to be medically necessary to treat this patient's OSA. F/u one year.    2. CPAP use counseling CPAP Counseling: had a lengthy discussion with the patient regarding the importance of PAP therapy in management of the sleep apnea. Patient appears to understand the risk factor reduction and also understands the risks associated with untreated sleep apnea. Patient will try to make a good faith effort to remain compliant with therapy. Also instructed the patient on proper cleaning of the device including the water must be changed daily if possible and use of distilled water is preferred. Patient understands that the machine should be regularly cleaned with appropriate recommended cleaning solutions that do not damage the PAP machine for  example given white vinegar and water rinses. Other methods such as ozone treatment may not be as good as these simple methods to achieve cleaning.   3. Hypertension, unspecified type Controlled with amlodipine and telmisartan. Continue.        General Counseling: I have discussed the findings of the evaluation and examination with Warren [name].  I have also discussed any further diagnostic evaluation thatmay be needed or ordered today. Warren [name] verbalizes understanding of the findings of todays visit. We also reviewed his medications today and discussed drug interactions and side effects including but not limited excessive drowsiness and altered mental states. We also discussed that there is always a risk not just to him but also people around him. he has been encouraged to call the office with any questions or concerns that should arise related to todays visit.  No orders of the defined types were placed in this encounter.       I have personally obtained a history, examined the patient, evaluated laboratory and imaging results, formulated the assessment and plan and placed orders. This patient was seen today by Warren Kluver, PA-C in collaboration with Dr. Freda Munro.   Yevonne Pax, MD [facility] Diplomate ABMS Pulmonary Critical Care Medicine and Sleep Medicine

## 2023-08-19 ENCOUNTER — Ambulatory Visit (INDEPENDENT_AMBULATORY_CARE_PROVIDER_SITE_OTHER): Payer: 59 | Admitting: Internal Medicine

## 2023-08-19 VITALS — BP 152/87 | HR 73 | Resp 16 | Ht 74.0 in | Wt 292.0 lb

## 2023-08-19 DIAGNOSIS — I10 Essential (primary) hypertension: Secondary | ICD-10-CM | POA: Diagnosis not present

## 2023-08-19 DIAGNOSIS — Z7189 Other specified counseling: Secondary | ICD-10-CM | POA: Diagnosis not present

## 2023-08-19 DIAGNOSIS — G4733 Obstructive sleep apnea (adult) (pediatric): Secondary | ICD-10-CM | POA: Diagnosis not present

## 2023-08-19 NOTE — Patient Instructions (Signed)

## 2023-09-11 ENCOUNTER — Other Ambulatory Visit: Payer: Self-pay | Admitting: Podiatry

## 2023-09-17 ENCOUNTER — Telehealth: Payer: Self-pay | Admitting: Podiatry

## 2023-09-17 NOTE — Telephone Encounter (Signed)
 This pt is one of Dr.Hyatt's but you (Dr. Jamse Arn) prescribed a medication to him (meloxicam (MOBIC) 15 MG tablet ) he is requesting a refill be sent to his pharmacy. Thank you

## 2023-09-18 ENCOUNTER — Other Ambulatory Visit: Payer: Self-pay

## 2023-09-18 MED ORDER — MELOXICAM 15 MG PO TABS: 15.0000 mg | ORAL_TABLET | Freq: Every day | ORAL | 0 refills | Status: DC

## 2023-10-15 ENCOUNTER — Ambulatory Visit (INDEPENDENT_AMBULATORY_CARE_PROVIDER_SITE_OTHER): Payer: 59

## 2023-10-15 ENCOUNTER — Ambulatory Visit: Payer: 59

## 2023-10-15 VITALS — BP 120/72

## 2023-10-15 DIAGNOSIS — T50901A Poisoning by unspecified drugs, medicaments and biological substances, accidental (unintentional), initial encounter: Secondary | ICD-10-CM | POA: Diagnosis not present

## 2023-10-15 DIAGNOSIS — Z0130 Encounter for examination of blood pressure without abnormal findings: Secondary | ICD-10-CM

## 2023-10-15 NOTE — Telephone Encounter (Signed)
 Message from Lohman M sent at 10/15/2023  1:57 PM EST  Summary: Pt concerned about the amount of medication in his system.   Pt stated that he accidentally took 3 of the telmisartan -hydrochlorothiazide  (MICARDIS  HCT) 80-25 MG tablet and he is concerned about the amount of medication in his system. Pt requests that a nurse return his call. Cb# [phone]          Chief Complaint: too triple dose of Micardis  Symptoms: sore throat , low grade fever Frequency: took 2 very early this am and then again with his regular meds. Pt was trying to take Mucinex not this med for his cold sx. After her woke up and took his am dose he then realized that he had taken triple dose Pertinent Negatives: Patient denies dizziness, weakness or  Disposition: [] ED /[] Urgent Care (no appt availability in office) / [] Appointment(In office/virtual)/ []  [place] [facility]/ [] Home Care/ [] Refused Recommended Disposition /[]  Mobile Bus/ [x]  Follow-up with PCP Additional Notes: amlodipine  dose was taken this am as well Called clinic and was advised to send mote high priority.    Reason for Disposition  [1] DOUBLE DOSE (an extra dose or lesser amount) of prescription drug AND [2] NO symptoms  (Exception: A double dose of antibiotics.)  Answer Assessment - Initial Assessment Questions 1. NAME of MEDICINE: What medicine(s) are you calling about?     Micardis  2. QUESTION: What is your question? (e.g., double dose of medicine, side effect)     Triple dose 3. PRESCRIBER: Who prescribed the medicine? Reason: if prescribed by specialist, call should be referred to that group.     Erin Mecum PA 4. SYMPTOMS: Do you have any symptoms? If Yes, ask: What symptoms are you having?  How bad are the symptoms (e.g., mild, moderate, severe)     No dizziness, low grade fever, sore throat  Protocols used: Medication Question Call-A-AH

## 2023-10-15 NOTE — Telephone Encounter (Signed)
 Patient ID: Warren [name], male    DOB: [birth year], 73 y.o.   MRN: [id number]  PCP: Leavy Mole, PA-C  Chief Complaint  Patient presents with   Medication Problem    Subjective:   Warren [name] is a 73 y.o. male, presents to clinic with CC of the following:  HPI  Pt presented to the office for triage and nurse BP check I was asked to evaluate Pt accidentally took 3 of his blood pressure medications this morning specifically his temisartan hydrochlorothiazide  80-25, he also took his normal amlodipine  dose He has been urinating more than normal, he is a little anxious because he realized he took too many of his blood pressure medications when he was trying to take Mucinex Blood pressure is checked today in office  BP Readings from Last 3 Encounters:  10/15/23 120/72  08/19/23 (!) 152/87  07/23/23 130/82     Patient Active Problem List   Diagnosis Date Noted   CPAP use counseling 08/19/2023   Hypertension 08/19/2023   OSA on CPAP 10/31/2020   PIN (prostatic intraepithelial neoplasia) 09/05/2017   Colon cancer screening 07/03/2017   Elevated PSA 06/07/2017   Erectile dysfunction 02/27/2016   Class 2 severe obesity due to excess calories with serious comorbidity and body mass index (BMI) of 38.0 to 38.9 in adult (HCC) 02/24/2015   Allergic rhinitis 01/12/2008   Hyperlipidemia 10/08/2007   Benign essential HTN 08/13/2007      Current Outpatient Medications:    amLODipine  (NORVASC ) 5 MG tablet, Take 1.5 tablets (7.5 mg total) by mouth daily. TAKE 1 AND 1/2 TABLETS DAILY BY MOUTH, Disp: 135 tablet, Rfl: 1   aspirin  EC 81 MG tablet, Take 1 tablet (81 mg total) by mouth daily., Disp: , Rfl:    meloxicam  (MOBIC ) 15 MG tablet, Take 1 tablet (15 mg total) by mouth daily., Disp: 30 tablet, Rfl: 0   Omega-3 Fatty Acids (FISH OIL) 1200 MG CAPS, Take 1,200 mg by mouth., Disp: , Rfl:    rosuvastatin  (CRESTOR ) 5 MG tablet, TAKE 1 TABLET (5 MG TOTAL) BY MOUTH DAILY., Disp: 90  tablet, Rfl: 1   tadalafil  (CIALIS ) 5 MG tablet, TAKE 1 TABLET (5 MG TOTAL) BY MOUTH DAILY., Disp: 90 tablet, Rfl: 3   telmisartan -hydrochlorothiazide  (MICARDIS  HCT) 80-25 MG tablet, Take 1 tablet by mouth daily. TAKE 1 TABLET BY MOUTH EVERY DAY FOR BLOOD PRESSURE, Disp: 90 tablet, Rfl: 1   No Known Allergies   Social History   Tobacco Use   Smoking status: Never   Smokeless tobacco: Never  Vaping Use   Vaping status: Never Used  Substance Use Topics   Alcohol use: Yes    Alcohol/week: 0.0 standard drinks of alcohol    Comment: 10/31/2016 wine a couple times/month   Drug use: No      Chart Review Today: I personally reviewed active problem list, medication list, allergies, family history, social history, health maintenance, notes from last encounter, lab results, imaging with the patient/caregiver today.   Review of Systems     Objective:   There were no vitals filed for this visit.  BP Readings from Last 3 Encounters:  10/15/23 120/72  08/19/23 (!) 152/87  07/23/23 130/82    There is no height or weight on file to calculate BMI.  Physical Exam Vitals and nursing note reviewed.  Constitutional:      General: He is not in acute distress.    Appearance: Normal appearance. He is well-developed. He is obese.  He is not ill-appearing, toxic-appearing or diaphoretic.  HENT:     Head: Normocephalic and atraumatic.     Nose: Nose normal.  Eyes:     General:        Right eye: No discharge.        Left eye: No discharge.     Conjunctiva/sclera: Conjunctivae normal.  Neck:     Trachea: No tracheal deviation.  Cardiovascular:     Rate and Rhythm: Normal rate and regular rhythm.  Pulmonary:     Effort: Pulmonary effort is normal. No respiratory distress.     Breath sounds: No stridor.  Musculoskeletal:        General: Normal range of motion.  Skin:    General: Skin is warm and dry.     Findings: No rash.  Neurological:     Mental Status: He is alert.     Motor:  No abnormal muscle tone.     Coordination: Coordination normal.  Psychiatric:        Behavior: Behavior normal.      Results for orders placed or performed in visit on 07/23/23  Lipid Profile   Collection Time: 07/23/23  8:55 AM  Result Value Ref Range   Cholesterol 169 <200 mg/dL   HDL 43 > OR = 40 mg/dL   Triglycerides 763 (H) <150 mg/dL   LDL Cholesterol (Calc) 93 mg/dL (calc)   Total CHOL/HDL Ratio 3.9 <5.0 (calc)   Non-HDL Cholesterol (Calc) 126 <130 mg/dL (calc)  COMPLETE METABOLIC PANEL WITH GFR   Collection Time: 07/23/23  8:55 AM  Result Value Ref Range   Glucose, Bld 78 65 - 99 mg/dL   BUN 21 7 - 25 mg/dL   Creat 8.86 9.29 - 8.71 mg/dL   eGFR 70 > OR = 60 fO/fpw/8.26f7   BUN/Creatinine Ratio SEE NOTE: 6 - 22 (calc)   Sodium 142 135 - 146 mmol/L   Potassium 4.0 3.5 - 5.3 mmol/L   Chloride 103 98 - 110 mmol/L   CO2 31 20 - 32 mmol/L   Calcium  9.7 8.6 - 10.3 mg/dL   Total Protein 7.0 6.1 - 8.1 g/dL   Albumin 4.3 3.6 - 5.1 g/dL   Globulin 2.7 1.9 - 3.7 g/dL (calc)   AG Ratio 1.6 1.0 - 2.5 (calc)   Total Bilirubin 1.0 0.2 - 1.2 mg/dL   Alkaline phosphatase (APISO) 48 35 - 144 U/L   AST 26 10 - 35 U/L   ALT 30 9 - 46 U/L  CBC w/Diff/Platelet   Collection Time: 07/23/23  8:55 AM  Result Value Ref Range   WBC 6.8 3.8 - 10.8 Thousand/uL   RBC 5.14 4.20 - 5.80 Million/uL   Hemoglobin 15.5 13.2 - 17.1 g/dL   HCT 53.9 61.4 - 49.9 %   MCV 89.5 80.0 - 100.0 fL   MCH 30.2 27.0 - 33.0 pg   MCHC 33.7 32.0 - 36.0 g/dL   RDW 87.4 88.9 - 84.9 %   Platelets 254 140 - 400 Thousand/uL   MPV 11.3 7.5 - 12.5 fL   Neutro Abs 3,713 1,500 - 7,800 cells/uL   Absolute Lymphocytes 2,033 850 - 3,900 cells/uL   Absolute Monocytes 612 200 - 950 cells/uL   Eosinophils Absolute 394 15 - 500 cells/uL   Basophils Absolute 48 0 - 200 cells/uL   Neutrophils Relative % 54.6 %   Total Lymphocyte 29.9 %   Monocytes Relative 9.0 %   Eosinophils Relative 5.8 %   Basophils Relative 0.7 %  HgB A1c   Collection Time: 07/23/23  8:55 AM  Result Value Ref Range   Hgb A1c MFr Bld 5.8 (H) <5.7 % of total Hgb   Mean Plasma Glucose 120 mg/dL   eAG (mmol/L) 6.6 mmol/L       Assessment & Plan:     ICD-10-CM   1. Medication overdose, accidental or unintentional, initial encounter  T50.901A    3 telmisartan -HCTZ 80-25 in last 12-24 hours    BP in normal range, pt well appearing Looked up all drug component pharmacology, half life etc Pt advised to skip tomorrows telmisartan -hydrochlorothiazide  dose and then resume the next day with ARB 24 hour half life and hydrochlorothiazide  relatively short half life If any low BP, lightheadedness, near syncope tomorrow he can also additionally skip amlodipine , but if BP normal tomorrow take amlodipine  and then next day (Thursday 2/6/20250 resume meds).      Michelene Cower, PA-C 10/15/23   3:45 PM

## 2023-10-15 NOTE — Addendum Note (Signed)
Addended by: Danelle Berry on: 10/15/2023 03:50 PM   Modules accepted: Level of Service

## 2023-10-15 NOTE — Progress Notes (Signed)
Patient took 3 of his Telmisartan-hydrochlorothiazide plus his Amlodipine. Advised by Sheliah Mends to skip dose of Telmisartan-HCTZ tomorrow, and resume the following day. BP reading normal during nurse visit. No lightheaded or dizziness.

## 2023-10-16 ENCOUNTER — Other Ambulatory Visit: Payer: Self-pay | Admitting: Podiatry

## 2023-11-14 ENCOUNTER — Other Ambulatory Visit: Payer: Self-pay | Admitting: Podiatry

## 2023-11-18 DIAGNOSIS — G4733 Obstructive sleep apnea (adult) (pediatric): Secondary | ICD-10-CM | POA: Diagnosis not present

## 2023-11-28 ENCOUNTER — Ambulatory Visit: Payer: 59

## 2023-11-28 DIAGNOSIS — Z0000 Encounter for general adult medical examination without abnormal findings: Secondary | ICD-10-CM | POA: Diagnosis not present

## 2023-11-28 NOTE — Progress Notes (Signed)
 Subjective:   Warren [name] is a 73 y.o. who presents for a Medicare Wellness preventive visit.  Visit Complete: Virtual I connected with  Warren [name] on 11/28/23 by a video and audio enabled telemedicine application and verified that I am speaking with the correct person using two identifiers.  Patient Location: Home  Provider Location: Office/Clinic  I discussed the limitations of evaluation and management by telemedicine. The patient expressed understanding and agreed to proceed.  Vital Signs: Because this visit was a virtual/telehealth visit, some criteria may be missing or patient reported. Any vitals not documented were not able to be obtained and vitals that have been documented are patient reported.   Persons Participating in Visit: Patient.  AWV Questionnaire: No: Patient Medicare AWV questionnaire was not completed prior to this visit.  Cardiac Risk Factors include: advanced age (>75men, >66 women);dyslipidemia;male gender;hypertension;obesity (BMI >30kg/m2)     Objective:    There were no vitals filed for this visit. There is no height or weight on file to calculate BMI.     11/28/2023    8:18 AM 12/12/2022    9:48 AM 11/22/2022    8:22 AM 07/12/2020    9:41 AM 07/09/2019    9:28 AM 07/03/2017   10:35 AM 06/06/2017    9:43 AM  Advanced Directives  Does Patient Have a Medical Advance Directive? No No No No No No No  Would patient like information on creating a medical advance directive? No - Patient declined   Yes (MAU/Ambulatory/Procedural Areas - Information given) No - Patient declined      Current Medications (verified) Outpatient Encounter Medications as of 11/28/2023  Medication Sig   amLODipine (NORVASC) 5 MG tablet Take 1.5 tablets (7.5 mg total) by mouth daily. TAKE 1 AND 1/2 TABLETS DAILY BY MOUTH   aspirin EC 81 MG tablet Take 1 tablet (81 mg total) by mouth daily.   meloxicam (MOBIC) 15 MG tablet TAKE 1 TABLET (15 MG TOTAL) BY MOUTH DAILY.    Omega-3 Fatty Acids (FISH OIL) 1200 MG CAPS Take 1,200 mg by mouth.   rosuvastatin (CRESTOR) 5 MG tablet TAKE 1 TABLET (5 MG TOTAL) BY MOUTH DAILY.   tadalafil (CIALIS) 5 MG tablet TAKE 1 TABLET (5 MG TOTAL) BY MOUTH DAILY.   telmisartan-hydrochlorothiazide (MICARDIS HCT) 80-25 MG tablet Take 1 tablet by mouth daily. TAKE 1 TABLET BY MOUTH EVERY DAY FOR BLOOD PRESSURE   No facility-administered encounter medications on file as of 11/28/2023.    Allergies (verified) Patient has no active allergies.   History: Past Medical History:  Diagnosis Date   Elevated PSA 06/07/2017   6.2 -- refer to urologist Sept 2018   Erectile dysfunction 02/27/2016   Hypercholesterolemia without hypertriglyceridemia 10/08/2007   Hypertension    Malignant hyperthermia 1986   "told I had maybe a mild case of imalignant hyperthermia at [facility]"   Obesity 02/24/2015   OSA on CPAP 02/24/2015   CPAP q night    Osteoarthritis    "feet and knees"  (10/31/2016)   PIN (prostatic intraepithelial neoplasia) 09/05/2017   Prostate biopsy Nov 2018   Past Surgical History:  Procedure Laterality Date   COLONOSCOPY WITH PROPOFOL N/A 12/12/2022   Procedure: COLONOSCOPY WITH PROPOFOL;  Surgeon: Wyline Mood, MD;  Location: [facility] ENDOSCOPY;  Service: Gastroenterology;  Laterality: N/A;   HAMMER TOE SURGERY Bilateral 11/2015 - 01/2016   "right-left"   JOINT REPLACEMENT     SOFT TISSUE BIOPSY Right ~ 1986   "@ [facility]"  TOTAL KNEE ARTHROPLASTY Right 08/22/2016   Procedure: TOTAL KNEE ARTHROPLASTY;  Surgeon: Loreta Ave, MD;  Location: [facility] OR;  Service: Orthopedics;  Laterality: Right;   TOTAL KNEE ARTHROPLASTY Left 10/31/2016   TOTAL KNEE ARTHROPLASTY Left 10/31/2016   Procedure: TOTAL KNEE ARTHROPLASTY;  Surgeon: Loreta Ave, MD;  Location: [facility] OR;  Service: Orthopedics;  Laterality: Left;   Family History  Problem Relation Age of Onset   Heart disease Mother    Stroke Mother    Suicidality Father    Cancer Sister         breast   Social History   Socioeconomic History   Marital status: Married    Spouse name: Not on file   Number of children: 2   Years of education: Not on file   Highest education level: Associate degree: occupational, Scientist, product/process development, or vocational program  Occupational History   Not on file  Tobacco Use   Smoking status: Never   Smokeless tobacco: Never  Vaping Use   Vaping status: Never Used  Substance and Sexual Activity   Alcohol use: Yes    Alcohol/week: 0.0 standard drinks of alcohol    Comment: 10/31/2016 "wine a couple times/month"   Drug use: No   Sexual activity: Yes    Partners: Female  Other Topics Concern   Not on file  Social History Narrative   Not on file   Social Drivers of Health   Financial Resource Strain: Low Risk  (11/28/2023)   Overall Financial Resource Strain (CARDIA)    Difficulty of Paying Living Expenses: Not hard at all  Food Insecurity: No Food Insecurity (11/28/2023)   Hunger Vital Sign    Worried About Running Out of Food in the Last Year: Never true    Ran Out of Food in the Last Year: Never true  Transportation Needs: No Transportation Needs (11/28/2023)   PRAPARE - Administrator, Civil Service (Medical): No    Lack of Transportation (Non-Medical): No  Physical Activity: Sufficiently Active (11/28/2023)   Exercise Vital Sign    Days of Exercise per Week: 5 days    Minutes of Exercise per Session: 40 min  Stress: No Stress Concern Present (11/28/2023)   [employer] of Occupational Health - Occupational Stress Questionnaire    Feeling of Stress : Not at all  Social Connections: Socially Integrated (11/28/2023)   Social Connection and Isolation Panel [NHANES]    Frequency of Communication with Friends and Family: More than three times a week    Frequency of Social Gatherings with Friends and Family: Twice a week    Attends Religious Services: More than 4 times per year    Active Member of [employer] or Organizations: Yes     Attends Engineer, structural: More than 4 times per year    Marital Status: Married    Tobacco Counseling Counseling given: Not Answered    Clinical Intake:  Pre-visit preparation completed: Yes  Pain : No/denies pain     Nutritional Status: BMI > 30  Obese Nutritional Risks: None Diabetes: No  Lab Results  Component Value Date   HGBA1C 5.8 (H) 07/23/2023   HGBA1C 5.8 (H) 01/16/2023   HGBA1C 5.4 04/25/2021     How often do you need to have someone help you when you read instructions, pamphlets, or other written materials from your doctor or pharmacy?: 1 - Never  Interpreter Needed?: No  Information entered by :: Kennedy Bucker, LPN   Activities of Daily  Living     11/28/2023    8:19 AM 11/27/2023    8:24 AM  In your present state of health, do you have any difficulty performing the following activities:  Hearing? 0 0  Vision? 0 0  Difficulty concentrating or making decisions? 0 0  Walking or climbing stairs? 0 0  Dressing or bathing? 0 0  Doing errands, shopping? 0 0  Preparing Food and eating ? N N  Using the Toilet? N N  In the past six months, have you accidently leaked urine? N N  Do you have problems with loss of bowel control? N N  Managing your Medications? N N  Managing your Finances? N N  Housekeeping or managing your Housekeeping? N N    Patient Care Team: Danelle Berry, PA-C as PCP - General (Family Medicine) Vanna Scotland, MD as Consulting Physician (Urology)  Indicate any recent Medical Services you may have received from other than [facility] providers in the past year (date may be approximate).     Assessment:   This is a routine wellness examination for Warren [name].  Hearing/Vision screen Hearing Screening - Comments:: NO AIDS Vision Screening - Comments:: NO GLASSES- MY EYE DOCTOR IN [place]   Goals Addressed             This Visit's Progress    DIET - EAT MORE FRUITS AND VEGETABLES         Depression Screen      11/28/2023    8:17 AM 07/23/2023    8:09 AM 01/16/2023    8:35 AM 11/22/2022    8:19 AM 08/13/2022    3:28 PM 02/08/2022    2:46 PM 08/08/2021    8:23 AM  PHQ 2/9 Scores  PHQ - 2 Score 0 0 0 0 0 0 0  PHQ- 9 Score 0 0 0  0 0 0    Fall Risk     11/28/2023    8:19 AM 11/27/2023    8:24 AM 07/23/2023    8:08 AM 01/16/2023    8:35 AM 11/22/2022    8:16 AM  Fall Risk   Falls in the past year? 0 0 0 0 0  Number falls in past yr: 0 0 0 0 0  Injury with Fall? 0 0 0 0 0  Risk for fall due to : No Fall Risks  No Fall Risks No Fall Risks No Fall Risks  Follow up Falls prevention discussed;Falls evaluation completed  Falls prevention discussed;Education provided;Falls evaluation completed Falls prevention discussed Education provided;Falls prevention discussed    MEDICARE RISK AT HOME:  Medicare Risk at Home Any stairs in or around the home?: No If so, are there any without handrails?: No Home free of loose throw rugs in walkways, pet beds, electrical cords, etc?: Yes Adequate lighting in your home to reduce risk of falls?: Yes Life alert?: No Use of a cane, walker or w/c?: No Grab bars in the bathroom?: Yes Shower chair or bench in shower?: No Elevated toilet seat or a handicapped toilet?: Yes  TIMED UP AND GO:  Was the test performed?  No  Cognitive Function: 6CIT completed        11/28/2023    8:20 AM 11/22/2022    8:29 AM 07/12/2020    9:34 AM  6CIT Screen  What Year? 0 points 0 points 0 points  What month? 0 points 0 points 0 points  What time? 0 points 0 points 0 points  Count back from 20 0 points  0 points 0 points  Months in reverse 0 points 0 points 0 points  Repeat phrase 0 points 0 points 4 points  Total Score 0 points 0 points 4 points    Immunizations Immunization History  Administered Date(s) Administered   Influenza, High Dose Seasonal PF 07/08/2018   Influenza, Seasonal, Injecte, Preservative Fre 04/10/2012, 06/24/2013   Influenza,inj,Quad PF,6+ Mos 08/06/2016,  06/06/2017   PFIZER(Purple Top)SARS-COV-2 Vaccination 10/12/2019, 11/05/2019, 06/30/2020   Tdap 02/08/2011   Zoster, Live 03/19/2013    Screening Tests Health Maintenance  Topic Date Due   Zoster Vaccines- Shingrix (1 of 2) 12/22/2002   Pneumonia Vaccine 84+ Years old (1 of 1 - PCV) Never done   COVID-19 Vaccine (4 - 2024-25 season) 05/12/2023   INFLUENZA VACCINE  12/09/2023 (Originally 04/11/2023)   DTaP/Tdap/Td (2 - Td or Tdap) 07/22/2024 (Originally 02/07/2021)   Medicare Annual Wellness (AWV)  11/27/2024   Colonoscopy  12/11/2032   Hepatitis C Screening  Completed   HPV VACCINES  Aged Out    Health Maintenance  Health Maintenance Due  Topic Date Due   Zoster Vaccines- Shingrix (1 of 2) 12/22/2002   Pneumonia Vaccine 57+ Years old (1 of 1 - PCV) Never done   COVID-19 Vaccine (4 - 2024-25 season) 05/12/2023   Health Maintenance Items Addressed: UP TO DATE, TDAP DUE, WILL GET FLU & PNA SHOT NEXT WEEK AT PHYSICAL  Additional Screening:  Vision Screening: Recommended annual ophthalmology exams for early detection of glaucoma and other disorders of the eye.  Dental Screening: Recommended annual dental exams for proper oral hygiene  Community Resource Referral / Chronic Care Management: CRR required this visit?  No   CCM required this visit?  No     Plan:     I have personally reviewed and noted the following in the patient's chart:   Medical and social history Use of alcohol, tobacco or illicit drugs  Current medications and supplements including opioid prescriptions. Patient is not currently taking opioid prescriptions. Functional ability and status Nutritional status Physical activity Advanced directives List of other physicians Hospitalizations, surgeries, and ER visits in previous 12 months Vitals Screenings to include cognitive, depression, and falls Referrals and appointments  In addition, I have reviewed and discussed with patient certain preventive  protocols, quality metrics, and best practice recommendations. A written personalized care plan for preventive services as well as general preventive health recommendations were provided to patient.     Hal Hope, LPN   12/17/8117   After Visit Summary: (MyChart) Due to this being a telephonic visit, the after visit summary with patients personalized plan was offered to patient via MyChart   Notes: Nothing significant to report at this time.

## 2023-11-28 NOTE — Patient Instructions (Addendum)
 Warren [name] , Thank you for taking time to come for your Medicare Wellness Visit. I appreciate your ongoing commitment to your health goals. Please review the following plan we discussed and let me know if I can assist you in the future.   Referrals/Orders/Follow-Ups/Clinician Recommendations: NONE  This is a list of the screening recommended for you and due dates:  Health Maintenance  Topic Date Due   Zoster (Shingles) Vaccine (1 of 2) 12/22/2002   Pneumonia Vaccine (1 of 1 - PCV) Never done   COVID-19 Vaccine (4 - 2024-25 season) 05/12/2023   Flu Shot  12/09/2023*   DTaP/Tdap/Td vaccine (2 - Td or Tdap) 07/22/2024*   Medicare Annual Wellness Visit  11/27/2024   Colon Cancer Screening  12/11/2032   Hepatitis C Screening  Completed   HPV Vaccine  Aged Out  *Topic was postponed. The date shown is not the original due date.    Advanced directives: (ACP Link)Information on Advanced Care Planning can be found at [facility] Advance Health Care Directives Advance Health Care Directives. [email or web address]   Next Medicare Annual Wellness Visit scheduled for next year: Yes   12/03/24 @ 8:10 AM BY VIDEO

## 2023-12-23 ENCOUNTER — Telehealth: Payer: Self-pay | Admitting: Podiatry

## 2023-12-23 NOTE — Telephone Encounter (Signed)
 Patient called requesting a refill of meloxicam. His preferred pharmacy is the [facility] in [place] on [address]. Thank you.

## 2023-12-25 NOTE — Telephone Encounter (Signed)
 Patient called and said that he was seen by Dr.Hyatt on 05/30/2023 where he received a cortisone injection. Thank you.

## 2023-12-31 ENCOUNTER — Other Ambulatory Visit: Payer: Self-pay | Admitting: Podiatry

## 2024-01-03 ENCOUNTER — Ambulatory Visit (INDEPENDENT_AMBULATORY_CARE_PROVIDER_SITE_OTHER): Admitting: Family Medicine

## 2024-01-03 ENCOUNTER — Encounter: Payer: Self-pay | Admitting: Family Medicine

## 2024-01-03 VITALS — BP 130/80 | HR 77 | Temp 97.8°F | Resp 18 | Ht 74.0 in | Wt 285.3 lb

## 2024-01-03 DIAGNOSIS — E782 Mixed hyperlipidemia: Secondary | ICD-10-CM | POA: Diagnosis not present

## 2024-01-03 DIAGNOSIS — M79605 Pain in left leg: Secondary | ICD-10-CM

## 2024-01-03 DIAGNOSIS — G4733 Obstructive sleep apnea (adult) (pediatric): Secondary | ICD-10-CM | POA: Diagnosis not present

## 2024-01-03 DIAGNOSIS — Z23 Encounter for immunization: Secondary | ICD-10-CM | POA: Diagnosis not present

## 2024-01-03 DIAGNOSIS — M79652 Pain in left thigh: Secondary | ICD-10-CM

## 2024-01-03 DIAGNOSIS — E66812 Obesity, class 2: Secondary | ICD-10-CM | POA: Diagnosis not present

## 2024-01-03 DIAGNOSIS — Z0000 Encounter for general adult medical examination without abnormal findings: Secondary | ICD-10-CM

## 2024-01-03 DIAGNOSIS — Z6836 Body mass index (BMI) 36.0-36.9, adult: Secondary | ICD-10-CM

## 2024-01-03 DIAGNOSIS — Z7189 Other specified counseling: Secondary | ICD-10-CM

## 2024-01-03 DIAGNOSIS — Z125 Encounter for screening for malignant neoplasm of prostate: Secondary | ICD-10-CM | POA: Diagnosis not present

## 2024-01-03 DIAGNOSIS — I10 Essential (primary) hypertension: Secondary | ICD-10-CM | POA: Diagnosis not present

## 2024-01-03 MED ORDER — MELOXICAM 15 MG PO TABS: 7.5000 mg | ORAL_TABLET | Freq: Every day | ORAL | 0 refills | Status: DC

## 2024-01-03 MED ORDER — ROSUVASTATIN CALCIUM 5 MG PO TABS: 5.0000 mg | ORAL_TABLET | Freq: Every day | ORAL | 1 refills | Status: DC

## 2024-01-03 MED ORDER — AMLODIPINE BESYLATE 5 MG PO TABS: 5.0000 mg | ORAL_TABLET | Freq: Every day | ORAL | 1 refills | Status: DC

## 2024-01-03 MED ORDER — TELMISARTAN-HCTZ 80-25 MG PO TABS: 1.0000 | ORAL_TABLET | Freq: Every day | ORAL | 1 refills | Status: DC

## 2024-01-03 NOTE — Patient Instructions (Addendum)
 See this link for instructions and documents that will help you complete advance care planning/advanced directives - including designating a medical power of attorney, completing a living will, etc [email or web address]   [facility] [address] [place], [place] [address] [phone]  [email or web address]   Health Maintenance  Topic Date Due   Pneumonia Vaccine (1 of 1 - PCV) Never done   Zoster (Shingles) Vaccine (1 of 2) 12/22/2002   COVID-19 Vaccine (4 - 2024-25 season) 05/12/2023   DTaP/Tdap/Td vaccine (2 - Td or Tdap) 07/22/2024*   Flu Shot  04/10/2024   Medicare Annual Wellness Visit  11/27/2024   Colon Cancer Screening  12/11/2032   Hepatitis C Screening  Completed   HPV Vaccine  Aged Out   Meningitis B Vaccine  Aged Out  *Topic was postponed. The date shown is not the original due date.   Recommend shingrix and pneumonia shots/vaccines

## 2024-01-03 NOTE — Progress Notes (Signed)
 Patient: Warren [name], Male    DOB: April 24, 1951, 73 y.o.   MRN: [id number] [name], PA-C Visit Date: 01/03/2024  Today's Provider: [name], PA-C   Chief Complaint  Patient presents with   Annual Exam   Subjective:   Annual physical exam:  Warren [name] is a 72 y.o. male who presents today for health maintenance and annual & complete physical exam.   Exercise/Activity:   active several days a week, golfing walking long distances, some interruption with right ankle pain flared up Sleep:  no concerns/CPAP good compliance needs new sleep med management though  Acute CC - needs mobic  refilled, pain to left hip/thigh/low back, CPAP referral He did MWV with nurse last month   USPSTF grade A and B recommendations - reviewed and addressed today  Depression:  Phq 9 completed today by patient, was reviewed by me with patient in the room, score is  negative, pt feels mood is good.  Pt has no clinical depression, I have no concerns today. He did note stress about insurance changes and difficulty getting and managing OSA/CPAP supplies only     01/03/2024    8:37 AM 11/28/2023    8:17 AM 07/23/2023    8:09 AM  Depression screen PHQ 2/9  Decreased Interest 0 0 0  Down, Depressed, Hopeless 0 0 0  PHQ - 2 Score 0 0 0  Altered sleeping 0 0 0  Tired, decreased energy 0 0 0  Change in appetite 0 0 0  Feeling bad or failure about yourself  0 0 0  Trouble concentrating 0 0 0  Moving slowly or fidgety/restless 0 0 0  Suicidal thoughts 0 0 0  PHQ-9 Score 0 0 0  Difficult doing work/chores Not difficult at all Not difficult at all Not difficult at all   Hep C Screening: done  STD testing and prevention (HIV/chl/gon/syphilis): none needed  Intimate partner violence: no concerns, denies, lives with wife  Advanced Care Planning:  A voluntary discussion about advance care planning including the explanation and discussion of advance directives.  Discussed health care proxy and  Living will, and the patient was able to identify a health care proxy as wife, [name] .  Patient does not have a living will at present time. Voluntary discussion done for more than 16 min today, he does not have ACP forms completed, we discussed aspects of forms, living will, medical POA, AD - DNI/DNR and when changes in health may affect changes in those decisions.  Reviewed where to get forms and how to complete.  He has talked to his wife about his wishes.  Provided a link to ACP packet provided for pt on AVS and he will look at more and consider completing and send us  copy when completed.   Health Maintenance  Topic Date Due   Pneumonia Vaccine 70+ Years old (1 of 1 - PCV) Never done   Zoster Vaccines- Shingrix (1 of 2) 12/22/2002   COVID-19 Vaccine (4 - 2024-25 season) 05/12/2023   DTaP/Tdap/Td (2 - Td or Tdap) 07/22/2024 (Originally 02/07/2021)   INFLUENZA VACCINE  04/10/2024   Medicare Annual Wellness (AWV)  11/27/2024   Colonoscopy  12/11/2032   Hepatitis C Screening  Completed   HPV VACCINES  Aged Out   Meningococcal B Vaccine  Aged Out   Skin cancer:   Pt reports no hx of skin cancer, suspicious lesions/biopsies in the past. Right eyelid lesion she had had removed a few times -  Colorectal cancer:  colonoscopy is UTD Pt denies change to bowels or concerns  Prostate cancer: per urology Prostate cancer screening with PSA: Discussed risks and benefits of PSA testing and provided handout. Pt will to have PSA drawn today here today - urology will be able to see and hopefully use - last reviewed in chart was July 2024, increased to 9.1 Lab Results  Component Value Date   PSA1 9.1 (H) 03/25/2023   PSA1 9.4 (H) 02/05/2023   PSA1 8.6 (H) 08/01/2022    Lab Results  Component Value Date   PSA 5.90 (H) 04/25/2021   PSA 6.30 (H) 01/31/2021   Urinary Symptoms:  Sees urology for high PSA and urinary sx, had biopsy, f/up in June   Lung cancer:   Low Dose CT Chest recommended if  Age 25-80 years, 20 pack-year currently smoking OR have quit w/in 15years. Patient does not qualify.  No smoking hx  Social History   Tobacco Use   Smoking status: Never   Smokeless tobacco: Never  Substance Use Topics   Alcohol use: Yes    Alcohol/week: 0.0 standard drinks of alcohol    Comment: 10/31/2016 "wine a couple times/month"     Alcohol screening: Flowsheet Row Clinical Support from 11/28/2023 in [facility]  AUDIT-C Score 2      AAA: not indicated- The USPSTF recommends one-time screening with ultrasonography in men ages 76 to 40 years who have ever smoked  ECG:  not done today  Blood pressure/Hypertension: BP Readings from Last 3 Encounters:  01/03/24 130/80  10/15/23 120/72  08/19/23 (!) 152/87   Weight/Obesity: Wt Readings from Last 3 Encounters:  01/03/24 285 lb 4.8 oz (129.4 kg)  08/19/23 292 lb (132.5 kg)  07/23/23 286 lb 9.6 oz (130 kg)   BMI Readings from Last 3 Encounters:  01/03/24 36.63 kg/m  08/19/23 37.49 kg/m  07/23/23 36.80 kg/m    Lipids:  Lab Results  Component Value Date   CHOL 169 07/23/2023   CHOL 219 (H) 01/16/2023   CHOL 197 08/08/2021   Lab Results  Component Value Date   HDL 43 07/23/2023   HDL 41 01/16/2023   HDL 38 (L) 08/08/2021   Lab Results  Component Value Date   LDLCALC 93 07/23/2023   LDLCALC 150 (H) 01/16/2023   LDLCALC 131 (H) 08/08/2021   Lab Results  Component Value Date   TRIG 236 (H) 07/23/2023   TRIG 152 (H) 01/16/2023   TRIG 161 (H) 08/08/2021   Lab Results  Component Value Date   CHOLHDL 3.9 07/23/2023   CHOLHDL 5.3 (H) 01/16/2023   CHOLHDL 5.2 (H) 08/08/2021   No results found for: "LDLDIRECT" Based on the results of lipid panel his/her cardiovascular risk factor ( using Poole Cohort )  in the next 10 years is : The 10-year ASCVD risk score (Arnett DK, et al., 2019) is: 20%   Values used to calculate the score:     Age: 38 years     Sex: Male     Is  Non-Hispanic African American: Yes     Diabetic: No     Tobacco smoker: No     Systolic Blood Pressure: 130 mmHg     Is BP treated: Yes     HDL Cholesterol: 43 mg/dL     Total Cholesterol: 169 mg/dL Glucose:  Glucose, Bld  Date Value Ref Range Status  07/23/2023 78 65 - 99 mg/dL Final    Comment:    .  Fasting reference interval .   01/16/2023 97 65 - 99 mg/dL Final    Comment:    .            Fasting reference interval .   08/08/2021 101 (H) 65 - 99 mg/dL Final    Comment:    .            Fasting reference interval . For someone without known diabetes, a glucose value between 100 and 125 mg/dL is consistent with prediabetes and should be confirmed with a follow-up test. .     Social History       Social History   Socioeconomic History   Marital status: Married    Spouse name: Not on file   Number of children: 2   Years of education: Not on file   Highest education level: Associate degree: occupational, Scientist, product/process development, or vocational program  Occupational History   Not on file  Tobacco Use   Smoking status: Never   Smokeless tobacco: Never  Vaping Use   Vaping status: Never Used  Substance and Sexual Activity   Alcohol use: Yes    Alcohol/week: 0.0 standard drinks of alcohol    Comment: 10/31/2016 "wine a couple times/month"   Drug use: No   Sexual activity: Yes    Partners: Female  Other Topics Concern   Not on file  Social History Narrative   Not on file   Social Drivers of Health   Financial Resource Strain: Low Risk  (01/03/2024)   Overall Financial Resource Strain (CARDIA)    Difficulty of Paying Living Expenses: Not hard at all  Food Insecurity: No Food Insecurity (01/03/2024)   Hunger Vital Sign    Worried About Running Out of Food in the Last Year: Never true    Ran Out of Food in the Last Year: Never true  Transportation Needs: No Transportation Needs (01/03/2024)   PRAPARE - Administrator, Civil Service (Medical): No     Lack of Transportation (Non-Medical): No  Physical Activity: Sufficiently Active (01/03/2024)   Exercise Vital Sign    Days of Exercise per Week: 3 days    Minutes of Exercise per Session: 80 min  Stress: No Stress Concern Present (01/03/2024)   [employer] of Occupational Health - Occupational Stress Questionnaire    Feeling of Stress : Not at all  Social Connections: Socially Integrated (01/03/2024)   Social Connection and Isolation Panel [NHANES]    Frequency of Communication with Friends and Family: Twice a week    Frequency of Social Gatherings with Friends and Family: Once a week    Attends Religious Services: More than 4 times per year    Active Member of [employer] or Organizations: Yes    Attends Engineer, structural: More than 4 times per year    Marital Status: Married    Family History  Family History  Problem Relation Age of Onset   Heart disease Mother    Stroke Mother    Suicidality Father    Cancer Sister        breast    Patient Active Problem List   Diagnosis Date Noted   CPAP use counseling 08/19/2023   Hypertension 08/19/2023   OSA on CPAP 10/31/2020   PIN (prostatic intraepithelial neoplasia) 09/05/2017   Colon cancer screening 07/03/2017   Elevated PSA 06/07/2017   Erectile dysfunction 02/27/2016   Class 2 severe obesity due to excess calories with serious comorbidity and body mass index (BMI) of  38.0 to 38.9 in adult [facility]) 02/24/2015   Allergic rhinitis 01/12/2008   Hyperlipidemia 10/08/2007   Benign essential HTN 08/13/2007    Past Surgical History:  Procedure Laterality Date   COLONOSCOPY WITH PROPOFOL  N/A 12/12/2022   Procedure: COLONOSCOPY WITH PROPOFOL ;  Surgeon: [name], MD;  Location: [facility] ENDOSCOPY;  Service: Gastroenterology;  Laterality: N/A;   HAMMER TOE SURGERY Bilateral 11/2015 - 01/2016   "right-left"   JOINT REPLACEMENT     SOFT TISSUE BIOPSY Right ~ 1986   "@ [facility]"   TOTAL KNEE ARTHROPLASTY Right 08/22/2016    Procedure: TOTAL KNEE ARTHROPLASTY;  Surgeon: [name], MD;  Location: [facility] OR;  Service: Orthopedics;  Laterality: Right;   TOTAL KNEE ARTHROPLASTY Left 10/31/2016   TOTAL KNEE ARTHROPLASTY Left 10/31/2016   Procedure: TOTAL KNEE ARTHROPLASTY;  Surgeon: [name], MD;  Location: [facility] OR;  Service: Orthopedics;  Laterality: Left;   Meds reviewed  Current Outpatient Medications:    amLODipine  (NORVASC ) 5 MG tablet, Take 1.5 tablets (7.5 mg total) by mouth daily. TAKE 1 AND 1/2 TABLETS DAILY BY MOUTH (Patient taking differently: Take 5 mg by mouth daily. TAKE 1 AND 1/2 TABLETS DAILY BY MOUTH), Disp: 135 tablet, Rfl: 1   aspirin  EC 81 MG tablet, Take 1 tablet (81 mg total) by mouth daily., Disp: , Rfl:    meloxicam  (MOBIC ) 15 MG tablet, TAKE 1 TABLET (15 MG TOTAL) BY MOUTH DAILY., Disp: 30 tablet, Rfl: 0   Omega-3 Fatty Acids (FISH OIL) 1200 MG CAPS, Take 1,200 mg by mouth., Disp: , Rfl:    rosuvastatin  (CRESTOR ) 5 MG tablet, TAKE 1 TABLET (5 MG TOTAL) BY MOUTH DAILY., Disp: 90 tablet, Rfl: 1   tadalafil  (CIALIS ) 5 MG tablet, TAKE 1 TABLET (5 MG TOTAL) BY MOUTH DAILY., Disp: 90 tablet, Rfl: 3   telmisartan -hydrochlorothiazide  (MICARDIS  HCT) 80-25 MG tablet, Take 1 tablet by mouth daily. TAKE 1 TABLET BY MOUTH EVERY DAY FOR BLOOD PRESSURE, Disp: 90 tablet, Rfl: 1  No Active Allergies  Patient Care Team: [name], PA-C as PCP - General (Family Medicine) Dustin Gimenez, MD as Consulting Physician (Urology)   Chart Review: I personally reviewed active problem list, medication list, allergies, family history, social history, health maintenance, notes from last encounter, lab results, imaging with the patient/caregiver today.    Review of Systems  Constitutional: Negative.   HENT: Negative.    Eyes: Negative.   Respiratory: Negative.    Cardiovascular: Negative.   Gastrointestinal: Negative.   Endocrine: Negative.   Genitourinary: Negative.   Musculoskeletal: Negative.    Skin: Negative.   Allergic/Immunologic: Negative.   Neurological: Negative.   Hematological: Negative.   Psychiatric/Behavioral: Negative.    All other systems reviewed and are negative.         Objective:   Vitals:  Vitals:   01/03/24 0836  BP: 130/80  Pulse: 77  Resp: 18  Temp: 97.8 F (36.6 C)  TempSrc: Oral  SpO2: 95%  Weight: 285 lb 4.8 oz (129.4 kg)  Height: 6\' 2"  (1.88 m)    Body mass index is 36.63 kg/m.  Physical Exam Vitals and nursing note reviewed.  Constitutional:      General: He is not in acute distress.    Appearance: Normal appearance. He is well-developed. He is obese. He is not ill-appearing, toxic-appearing or diaphoretic.  HENT:     Head: Normocephalic and atraumatic.     Jaw: No trismus.     Right Ear: Tympanic membrane, ear canal  and external ear normal. There is no impacted cerumen.     Left Ear: Tympanic membrane, ear canal and external ear normal. There is no impacted cerumen.     Nose: Congestion and rhinorrhea present. No mucosal edema.     Right Sinus: No maxillary sinus tenderness or frontal sinus tenderness.     Left Sinus: No maxillary sinus tenderness or frontal sinus tenderness.     Mouth/Throat:     Mouth: Mucous membranes are moist.     Pharynx: Oropharynx is clear. Uvula midline. No oropharyngeal exudate, posterior oropharyngeal erythema or uvula swelling.  Eyes:     General: Lids are normal. No scleral icterus.       Right eye: No discharge.        Left eye: No discharge.     Conjunctiva/sclera: Conjunctivae normal.  Neck:     Trachea: Trachea and phonation normal. No tracheal deviation.  Cardiovascular:     Rate and Rhythm: Normal rate and regular rhythm.     Pulses: Normal pulses.          Radial pulses are 2+ on the right side and 2+ on the left side.       Posterior tibial pulses are 2+ on the right side and 2+ on the left side.     Heart sounds: Normal heart sounds. No murmur heard.    No friction rub. No gallop.   Pulmonary:     Effort: Pulmonary effort is normal.     Breath sounds: Normal breath sounds. No wheezing, rhonchi or rales.  Abdominal:     General: Bowel sounds are normal. There is no distension.     Palpations: Abdomen is soft.     Tenderness: There is no abdominal tenderness. There is no guarding or rebound.  Musculoskeletal:     Cervical back: Normal range of motion and neck supple.     Right lower leg: No edema.     Left lower leg: No edema.  Skin:    General: Skin is warm and dry.     Capillary Refill: Capillary refill takes less than 2 seconds.     Findings: No rash.  Neurological:     Mental Status: He is alert and oriented to person, place, and time.     Gait: Gait normal.  Psychiatric:        Mood and Affect: Mood normal.        Speech: Speech normal.        Behavior: Behavior normal.      No results found for this or any previous visit (from the past 2160 hours).  Fall Risk:    01/03/2024    8:36 AM 11/28/2023    8:19 AM 11/27/2023    8:24 AM 07/23/2023    8:08 AM 01/16/2023    8:35 AM  Fall Risk   Falls in the past year? 0 0 0 0 0  Number falls in past yr: 0 0 0 0 0  Injury with Fall? 0 0 0 0 0  Risk for fall due to :  No Fall Risks  No Fall Risks No Fall Risks  Follow up Falls evaluation completed Falls prevention discussed;Falls evaluation completed  Falls prevention discussed;Education provided;Falls evaluation completed Falls prevention discussed       Assessment & Plan:    CPE completed today  Prostate cancer screening and PSA options (with potential risks and benefits of testing vs not testing) were discussed along with recent recs/guidelines, shared decision making and  handout/information given to pt today  USPSTF grade A and B recommendations reviewed with patient; age-appropriate recommendations, preventive care, screening tests, etc discussed and encouraged; healthy living encouraged; see AVS for patient education given to patient  Discussed  importance of 150 minutes of physical activity weekly, AHA exercise recommendations given to pt in AVS/handout  Discussed importance of healthy diet:  eating lean meats and proteins, avoiding trans fats and saturated fats, avoid simple sugars and excessive carbs in diet, eat 6 servings of fruit/vegetables daily and drink plenty of water  and avoid sweet beverages.  DASH diet reviewed if pt has HTN  Recommended pt to do annual eye exam and routine dental exams/cleanings  Advance Care planning information and packet discussed and offered today, encouraged pt to discuss with family members/spouse/partner/friends and complete Advanced directive packet and bring copy to office   Reviewed Health Maintenance: Health Maintenance  Topic Date Due   Pneumonia Vaccine 49+ Years old (1 of 1 - PCV) Never done   Zoster Vaccines- Shingrix (1 of 2) 12/22/2002   COVID-19 Vaccine (4 - 2024-25 season) 05/12/2023   DTaP/Tdap/Td (2 - Td or Tdap) 07/22/2024 (Originally 02/07/2021)   INFLUENZA VACCINE  04/10/2024   Medicare Annual Wellness (AWV)  11/27/2024   Colonoscopy  12/11/2032   Hepatitis C Screening  Completed   HPV VACCINES  Aged Out   Meningococcal B Vaccine  Aged Out    Immunizations: Immunization History  Administered Date(s) Administered   Influenza, High Dose Seasonal PF 07/08/2018   Influenza, Seasonal, Injecte, Preservative Fre 04/10/2012, 06/24/2013   Influenza,inj,Quad PF,6+ Mos 08/06/2016, 06/06/2017   PFIZER(Purple Top)SARS-COV-2 Vaccination 10/12/2019, 11/05/2019, 06/30/2020   Tdap 02/08/2011   Zoster, Live 03/19/2013   Vaccines:  HPV: up to at age 35 , ask insurance if age between 62-45  Shingrix: recommended - 94-64 yo and ask insurance if covered when patient above 66 yo Pneumonia:  recommended -  educated and discussed with patient. Flu: not available educated and discussed with patient. COVID:    Ref to ortho for left hip left thigh        ICD-10-CM   1. Adult general  medical exam  Z00.00 CBC with Differential/Platelet    Comprehensive metabolic panel with GFR    Hemoglobin A1c    Lipid panel    2. Class 2 severe obesity with serious comorbidity and body mass index (BMI) of 36.0 to 36.9 in adult, unspecified obesity type (HCC)  Z61.096    Z68.36    E66.01    he has lost some weight, encouraged continued diet/lifestyle efforts, associated comorbidities HTN, HLD, OA, OSA, prediabetes    3. Benign essential HTN  I10 telmisartan -hydrochlorothiazide  (MICARDIS  HCT) 80-25 MG tablet    amLODipine  (NORVASC ) 5 MG tablet   meds refilled at current doses, BP at goal for age    34. Mixed hyperlipidemia  E78.2 rosuvastatin  (CRESTOR ) 5 MG tablet   meds refilled at current doses, checking labs with CPE    5. Screening for malignant neoplasm of prostate  Z12.5 PSA   per urology, he has f/up appt in June    6. Advanced care planning/counseling discussion  Z71.89    see counseling/documentation above - done today, forms discussed reviewed, encouraged to complete    7. Left thigh pain  M79.652    nothing reproducible on exam - he is thinking of a chiropractor consult - given referral for reputable local practice, consider ortho eval    8. OSA on CPAP  G47.33 Ambulatory referral to  Pulmonology   sleeps well with it, but having issues with needing new sleep medicine management - ref to  pulm Dr. Ardeth Beckers    9. Left leg pain  M79.605 Ambulatory referral to Orthopedic Surgery   anterior prox thigh, feels tight, sometimes feels like it radiate from left low back to left ant thigh    10. Need for shingles vaccine  Z23    educated and recommended, declined today    11. Need for pneumococcal vaccination  Z23    educated and recommended, declined today          [name], PA-C 01/03/24 9:00 AM  [facility]

## 2024-01-04 LAB — COMPREHENSIVE METABOLIC PANEL WITH GFR
AG Ratio: 1.7 (calc) (ref 1.0–2.5)
ALT: 35 U/L (ref 9–46)
AST: 32 U/L (ref 10–35)
Albumin: 4.5 g/dL (ref 3.6–5.1)
Alkaline phosphatase (APISO): 45 U/L (ref 35–144)
BUN: 20 mg/dL (ref 7–25)
CO2: 27 mmol/L (ref 20–32)
Calcium: 9.4 mg/dL (ref 8.6–10.3)
Chloride: 104 mmol/L (ref 98–110)
Creat: 1 mg/dL (ref 0.70–1.28)
Globulin: 2.7 g/dL (ref 1.9–3.7)
Glucose, Bld: 94 mg/dL (ref 65–99)
Potassium: 3.7 mmol/L (ref 3.5–5.3)
Sodium: 141 mmol/L (ref 135–146)
Total Bilirubin: 1 mg/dL (ref 0.2–1.2)
Total Protein: 7.2 g/dL (ref 6.1–8.1)
eGFR: 80 mL/min/{1.73_m2} (ref >=60)

## 2024-01-04 LAB — CBC WITH DIFFERENTIAL/PLATELET
Absolute Lymphocytes: 1940 {cells}/uL (ref 850–3900)
Absolute Monocytes: 502 {cells}/uL (ref 200–950)
Basophils Absolute: 59 {cells}/uL (ref 0–200)
Basophils Relative: 0.9 %
Eosinophils Absolute: 363 {cells}/uL (ref 15–500)
Eosinophils Relative: 5.5 %
HCT: 44.2 % (ref 38.5–50.0)
Hemoglobin: 15 g/dL (ref 13.2–17.1)
MCH: 30.1 pg (ref 27.0–33.0)
MCHC: 33.9 g/dL (ref 32.0–36.0)
MCV: 88.6 fL (ref 80.0–100.0)
MPV: 10.1 fL (ref 7.5–12.5)
Monocytes Relative: 7.6 %
Neutro Abs: 3736 {cells}/uL (ref 1500–7800)
Neutrophils Relative %: 56.6 %
Platelets: 274 10*3/uL (ref 140–400)
RBC: 4.99 10*6/uL (ref 4.20–5.80)
RDW: 12.6 % (ref 11.0–15.0)
Total Lymphocyte: 29.4 %
WBC: 6.6 10*3/uL (ref 3.8–10.8)

## 2024-01-04 LAB — HEMOGLOBIN A1C
Hgb A1c MFr Bld: 5.6 % (ref <5.7)
Mean Plasma Glucose: 114 mg/dL
eAG (mmol/L): 6.3 mmol/L

## 2024-01-04 LAB — PSA: PSA: 7.52 ng/mL — ABNORMAL HIGH (ref <=4.00)

## 2024-01-04 LAB — LIPID PANEL
Cholesterol: 147 mg/dL (ref <200)
HDL: 41 mg/dL (ref >=40)
LDL Cholesterol (Calc): 81 mg/dL
Non-HDL Cholesterol (Calc): 106 mg/dL (ref <130)
Total CHOL/HDL Ratio: 3.6 (calc) (ref <5.0)
Triglycerides: 152 mg/dL — ABNORMAL HIGH (ref <150)

## 2024-01-06 ENCOUNTER — Ambulatory Visit: Admitting: Sleep Medicine

## 2024-01-06 ENCOUNTER — Encounter: Payer: Self-pay | Admitting: Family Medicine

## 2024-01-06 ENCOUNTER — Encounter: Payer: Self-pay | Admitting: Sleep Medicine

## 2024-01-06 VITALS — BP 130/80 | HR 55 | Temp 98.1°F | Ht 74.0 in | Wt 288.0 lb

## 2024-01-06 DIAGNOSIS — E785 Hyperlipidemia, unspecified: Secondary | ICD-10-CM

## 2024-01-06 DIAGNOSIS — G4733 Obstructive sleep apnea (adult) (pediatric): Secondary | ICD-10-CM | POA: Diagnosis not present

## 2024-01-06 DIAGNOSIS — I10 Essential (primary) hypertension: Secondary | ICD-10-CM | POA: Diagnosis not present

## 2024-01-06 NOTE — Patient Instructions (Signed)

## 2024-01-06 NOTE — Progress Notes (Signed)
 Name:Warren [name] Warren [name] MRN: [id number] DOB: [birth year]   CHIEF COMPLAINT:  ESTABLISH CARE FOR OSA   HISTORY OF PRESENT ILLNESS:  [name] is a 73 y.o. w/ a h/o OSA, HTN, hyperlipidemia and obesity who presents to establish care for OSA. Reports that he was initially diagnosed with OSA around 15 years ago and was subsequently started on CPAP therapy. Reports using CPAP therapy every night, which is confirmed by compliance data. He is currently using the Airfit N20 nasal mask, which is comfortable. Reports feeling refreshed upon awakening with CPAP therapy. Denies snoring with CPAP therapy.    Bedtime 8:30 pm Sleep onset 10 mins Rise time 4:30 am   EPWORTH SLEEP SCORE 4    01/06/2024    9:00 AM  Results of the Epworth flowsheet  Sitting and reading 1  Watching TV 1  Sitting, inactive in a public place (e.g. a theatre or a meeting) 0  As a passenger in a car for an hour without a break 0  Lying down to rest in the afternoon when circumstances permit 2  Sitting and talking to someone 0  Sitting quietly after a lunch without alcohol 0  In a car, while stopped for a few minutes in traffic 0  Total score 4     PAST MEDICAL HISTORY :   has a past medical history of Elevated PSA (06/07/2017), Erectile dysfunction (02/27/2016), Hypercholesterolemia without hypertriglyceridemia (10/08/2007), Hypertension, Malignant hyperthermia (1986), Obesity (02/24/2015), OSA on CPAP (02/24/2015), Osteoarthritis, and PIN (prostatic intraepithelial neoplasia) (09/05/2017).  has a past surgical history that includes Total knee arthroplasty (Right, 08/22/2016); Total knee arthroplasty (Left, 10/31/2016); Hammer toe surgery (Bilateral, 11/2015 - 01/2016); Joint replacement; Soft Tissue Biopsy (Right, ~ 1986); Total knee arthroplasty (Left, 10/31/2016); and Colonoscopy with propofol  (N/A, 12/12/2022). Prior to Admission medications   Medication Sig Start Date End Date Taking? Authorizing Provider  amLODipine   (NORVASC ) 5 MG tablet Take 1 tablet (5 mg total) by mouth daily. 01/03/24  Yes [name], PA-C  aspirin  EC 81 MG tablet Take 1 tablet (81 mg total) by mouth daily. 06/06/17  Yes Lada, Ernestine Heads, MD  meloxicam  (MOBIC ) 15 MG tablet Take 0.5-1 tablets (7.5-15 mg total) by mouth daily. 01/03/24  Yes Tapia, Leisa, PA-C  Omega-3 Fatty Acids (FISH OIL) 1200 MG CAPS Take 1,200 mg by mouth.   Yes [provider]  rosuvastatin  (CRESTOR ) 5 MG tablet Take 1 tablet (5 mg total) by mouth daily. 01/03/24  Yes Tapia, Leisa, PA-C  tadalafil  (CIALIS ) 5 MG tablet TAKE 1 TABLET (5 MG TOTAL) BY MOUTH DAILY. 08/02/23  Yes Dustin Gimenez, MD  telmisartan -hydrochlorothiazide  (MICARDIS  HCT) 80-25 MG tablet Take 1 tablet by mouth daily. 01/03/24  Yes Tapia, Leisa, PA-C   No Known Allergies  FAMILY HISTORY:  family history includes Cancer in his sister; Heart disease in his mother; Stroke in his mother; Suicidality in his father. SOCIAL HISTORY:  reports that he has never smoked. He has never used smokeless tobacco. He reports current alcohol use. He reports that he does not use drugs.   Review of Systems:  Gen:  Denies  fever, sweats, chills weight loss  HEENT: Denies blurred vision, double vision, ear pain, eye pain, hearing loss, nose bleeds, sore throat Cardiac:  No dizziness, chest pain or heaviness, chest tightness,edema, No JVD Resp:   No cough, -sputum production, -shortness of breath,-wheezing, -hemoptysis,  Gi: Denies swallowing difficulty, stomach pain, nausea or vomiting, diarrhea, constipation, bowel incontinence Gu:  Denies bladder  incontinence, burning urine Ext:   Denies Joint pain, stiffness or swelling Skin: Denies  skin rash, easy bruising or bleeding or hives Endoc:  Denies polyuria, polydipsia , polyphagia or weight change Psych:   Denies depression, insomnia or hallucinations  Other:  All other systems negative  VITAL SIGNS: BP 130/80 (BP Location: Right Arm, Patient Position:  Sitting, Cuff Size: Large)   Pulse (!) 55   Temp 98.1 F (36.7 C) (Oral)   Ht 6\' 2"  (1.88 m)   Wt 288 lb (130.6 kg)   SpO2 95%   BMI 36.98 kg/m    Physical Examination:   General Appearance: No distress  EYES PERRLA, EOM intact.   NECK Supple, No JVD Pulmonary: normal breath sounds, No wheezing.  CardiovascularNormal S1,S2.  No m/r/g.   Abdomen: Benign, Soft, non-tender. Skin:   warm, no rashes, no ecchymosis  Extremities: normal, no cyanosis, clubbing. Neuro:without focal findings,  speech normal  PSYCHIATRIC: Mood, affect within normal limits.   ASSESSMENT AND PLAN  OSA Patient is using and benefiting from CPAP therapy. Order completed for new set up with [facility]. Discussed the consequences of untreated sleep apnea. Advised not to drive drowsy for safety of patient and others. Will follow up in 1 year.     HTN Stable, on current management. Following with PCP.   Hyperlipidemia Stable, on current management.    Patient  satisfied with Plan of action and management. All questions answered  I spent a total of 37 minutes reviewing chart data, face-to-face evaluation with the patient, counseling and coordination of care as detailed above.    Warren [name], M.D.  Sleep Medicine [facility]

## 2024-01-09 DIAGNOSIS — M5459 Other low back pain: Secondary | ICD-10-CM | POA: Diagnosis not present

## 2024-01-09 DIAGNOSIS — M542 Cervicalgia: Secondary | ICD-10-CM | POA: Diagnosis not present

## 2024-01-09 DIAGNOSIS — M9906 Segmental and somatic dysfunction of lower extremity: Secondary | ICD-10-CM | POA: Diagnosis not present

## 2024-01-09 DIAGNOSIS — M546 Pain in thoracic spine: Secondary | ICD-10-CM | POA: Diagnosis not present

## 2024-01-09 DIAGNOSIS — M9902 Segmental and somatic dysfunction of thoracic region: Secondary | ICD-10-CM | POA: Diagnosis not present

## 2024-01-09 DIAGNOSIS — M5416 Radiculopathy, lumbar region: Secondary | ICD-10-CM | POA: Diagnosis not present

## 2024-01-09 DIAGNOSIS — M9903 Segmental and somatic dysfunction of lumbar region: Secondary | ICD-10-CM | POA: Diagnosis not present

## 2024-01-09 DIAGNOSIS — M9901 Segmental and somatic dysfunction of cervical region: Secondary | ICD-10-CM | POA: Diagnosis not present

## 2024-01-09 NOTE — Telephone Encounter (Signed)
 Copied from CRM [id number]. Topic: Clinical - Medical Advice >> Jan 09, 2024  2:35 PM Tyronne Galloway wrote: Reason for CRM: Pt would like to know what company the clinic is using for his CPAP machine supplies so he could visit and look at the CPAP mask in person. Please call the patient back at [phone].

## 2024-01-09 NOTE — Telephone Encounter (Signed)
 Sent pt Mychart message to follow up on inquiry.

## 2024-01-10 DIAGNOSIS — M9903 Segmental and somatic dysfunction of lumbar region: Secondary | ICD-10-CM | POA: Diagnosis not present

## 2024-01-10 DIAGNOSIS — M5416 Radiculopathy, lumbar region: Secondary | ICD-10-CM | POA: Diagnosis not present

## 2024-01-10 DIAGNOSIS — M542 Cervicalgia: Secondary | ICD-10-CM | POA: Diagnosis not present

## 2024-01-10 DIAGNOSIS — M9902 Segmental and somatic dysfunction of thoracic region: Secondary | ICD-10-CM | POA: Diagnosis not present

## 2024-01-10 DIAGNOSIS — M9901 Segmental and somatic dysfunction of cervical region: Secondary | ICD-10-CM | POA: Diagnosis not present

## 2024-01-10 DIAGNOSIS — M546 Pain in thoracic spine: Secondary | ICD-10-CM | POA: Diagnosis not present

## 2024-01-10 DIAGNOSIS — M9906 Segmental and somatic dysfunction of lower extremity: Secondary | ICD-10-CM | POA: Diagnosis not present

## 2024-01-10 DIAGNOSIS — M5459 Other low back pain: Secondary | ICD-10-CM | POA: Diagnosis not present

## 2024-01-13 ENCOUNTER — Ambulatory Visit: Admitting: Podiatry

## 2024-01-13 ENCOUNTER — Ambulatory Visit: Admitting: Family Medicine

## 2024-01-15 DIAGNOSIS — M5459 Other low back pain: Secondary | ICD-10-CM | POA: Diagnosis not present

## 2024-01-15 DIAGNOSIS — M9902 Segmental and somatic dysfunction of thoracic region: Secondary | ICD-10-CM | POA: Diagnosis not present

## 2024-01-15 DIAGNOSIS — M5416 Radiculopathy, lumbar region: Secondary | ICD-10-CM | POA: Diagnosis not present

## 2024-01-15 DIAGNOSIS — M9906 Segmental and somatic dysfunction of lower extremity: Secondary | ICD-10-CM | POA: Diagnosis not present

## 2024-01-15 DIAGNOSIS — M542 Cervicalgia: Secondary | ICD-10-CM | POA: Diagnosis not present

## 2024-01-15 DIAGNOSIS — M9903 Segmental and somatic dysfunction of lumbar region: Secondary | ICD-10-CM | POA: Diagnosis not present

## 2024-01-15 DIAGNOSIS — M9901 Segmental and somatic dysfunction of cervical region: Secondary | ICD-10-CM | POA: Diagnosis not present

## 2024-01-15 DIAGNOSIS — M546 Pain in thoracic spine: Secondary | ICD-10-CM | POA: Diagnosis not present

## 2024-01-16 DIAGNOSIS — M542 Cervicalgia: Secondary | ICD-10-CM | POA: Diagnosis not present

## 2024-01-16 DIAGNOSIS — M9903 Segmental and somatic dysfunction of lumbar region: Secondary | ICD-10-CM | POA: Diagnosis not present

## 2024-01-16 DIAGNOSIS — M5459 Other low back pain: Secondary | ICD-10-CM | POA: Diagnosis not present

## 2024-01-16 DIAGNOSIS — M9902 Segmental and somatic dysfunction of thoracic region: Secondary | ICD-10-CM | POA: Diagnosis not present

## 2024-01-16 DIAGNOSIS — M9906 Segmental and somatic dysfunction of lower extremity: Secondary | ICD-10-CM | POA: Diagnosis not present

## 2024-01-16 DIAGNOSIS — M9901 Segmental and somatic dysfunction of cervical region: Secondary | ICD-10-CM | POA: Diagnosis not present

## 2024-01-16 DIAGNOSIS — M546 Pain in thoracic spine: Secondary | ICD-10-CM | POA: Diagnosis not present

## 2024-01-16 DIAGNOSIS — M5416 Radiculopathy, lumbar region: Secondary | ICD-10-CM | POA: Diagnosis not present

## 2024-01-20 ENCOUNTER — Ambulatory Visit: Admitting: Family Medicine

## 2024-01-20 DIAGNOSIS — M9903 Segmental and somatic dysfunction of lumbar region: Secondary | ICD-10-CM | POA: Diagnosis not present

## 2024-01-20 DIAGNOSIS — M5459 Other low back pain: Secondary | ICD-10-CM | POA: Diagnosis not present

## 2024-01-20 DIAGNOSIS — M542 Cervicalgia: Secondary | ICD-10-CM | POA: Diagnosis not present

## 2024-01-20 DIAGNOSIS — M546 Pain in thoracic spine: Secondary | ICD-10-CM | POA: Diagnosis not present

## 2024-01-20 DIAGNOSIS — M9906 Segmental and somatic dysfunction of lower extremity: Secondary | ICD-10-CM | POA: Diagnosis not present

## 2024-01-20 DIAGNOSIS — M9901 Segmental and somatic dysfunction of cervical region: Secondary | ICD-10-CM | POA: Diagnosis not present

## 2024-01-20 DIAGNOSIS — M5416 Radiculopathy, lumbar region: Secondary | ICD-10-CM | POA: Diagnosis not present

## 2024-01-20 DIAGNOSIS — M9902 Segmental and somatic dysfunction of thoracic region: Secondary | ICD-10-CM | POA: Diagnosis not present

## 2024-01-21 ENCOUNTER — Ambulatory Visit: Payer: 59 | Admitting: Physician Assistant

## 2024-01-21 DIAGNOSIS — M9902 Segmental and somatic dysfunction of thoracic region: Secondary | ICD-10-CM | POA: Diagnosis not present

## 2024-01-21 DIAGNOSIS — M542 Cervicalgia: Secondary | ICD-10-CM | POA: Diagnosis not present

## 2024-01-21 DIAGNOSIS — M5459 Other low back pain: Secondary | ICD-10-CM | POA: Diagnosis not present

## 2024-01-21 DIAGNOSIS — M5416 Radiculopathy, lumbar region: Secondary | ICD-10-CM | POA: Diagnosis not present

## 2024-01-21 DIAGNOSIS — M546 Pain in thoracic spine: Secondary | ICD-10-CM | POA: Diagnosis not present

## 2024-01-21 DIAGNOSIS — M9901 Segmental and somatic dysfunction of cervical region: Secondary | ICD-10-CM | POA: Diagnosis not present

## 2024-01-21 DIAGNOSIS — M9906 Segmental and somatic dysfunction of lower extremity: Secondary | ICD-10-CM | POA: Diagnosis not present

## 2024-01-21 DIAGNOSIS — M9903 Segmental and somatic dysfunction of lumbar region: Secondary | ICD-10-CM | POA: Diagnosis not present

## 2024-01-22 ENCOUNTER — Ambulatory Visit: Payer: Self-pay | Admitting: Family Medicine

## 2024-01-22 ENCOUNTER — Encounter: Payer: Self-pay | Admitting: Podiatry

## 2024-01-22 ENCOUNTER — Ambulatory Visit: Admitting: Podiatry

## 2024-01-22 ENCOUNTER — Encounter: Payer: Self-pay | Admitting: Urology

## 2024-01-22 ENCOUNTER — Ambulatory Visit (INDEPENDENT_AMBULATORY_CARE_PROVIDER_SITE_OTHER): Admitting: Podiatry

## 2024-01-22 DIAGNOSIS — M19071 Primary osteoarthritis, right ankle and foot: Secondary | ICD-10-CM

## 2024-01-22 DIAGNOSIS — M7751 Other enthesopathy of right foot: Secondary | ICD-10-CM

## 2024-01-22 DIAGNOSIS — M5459 Other low back pain: Secondary | ICD-10-CM | POA: Diagnosis not present

## 2024-01-22 DIAGNOSIS — M9903 Segmental and somatic dysfunction of lumbar region: Secondary | ICD-10-CM | POA: Diagnosis not present

## 2024-01-22 DIAGNOSIS — M9902 Segmental and somatic dysfunction of thoracic region: Secondary | ICD-10-CM | POA: Diagnosis not present

## 2024-01-22 DIAGNOSIS — M542 Cervicalgia: Secondary | ICD-10-CM | POA: Diagnosis not present

## 2024-01-22 DIAGNOSIS — M5416 Radiculopathy, lumbar region: Secondary | ICD-10-CM | POA: Diagnosis not present

## 2024-01-22 DIAGNOSIS — M9906 Segmental and somatic dysfunction of lower extremity: Secondary | ICD-10-CM | POA: Diagnosis not present

## 2024-01-22 DIAGNOSIS — M546 Pain in thoracic spine: Secondary | ICD-10-CM | POA: Diagnosis not present

## 2024-01-22 DIAGNOSIS — M9901 Segmental and somatic dysfunction of cervical region: Secondary | ICD-10-CM | POA: Diagnosis not present

## 2024-01-22 MED ORDER — MELOXICAM 15 MG PO TABS: 15.0000 mg | ORAL_TABLET | Freq: Every day | ORAL | 3 refills | Status: AC

## 2024-01-22 NOTE — Progress Notes (Signed)
 Warren [name] presents today complaining of pain to his right ankle.  He states his right here is he points to the medial gutter of the ankle joint right.  States he has flareups and has been out of meloxicam  for a while was recently refilled To Be out Again by the End of the Month.  Denies Any Trauma to the Right Lower Extremity.  Objective: Vital Signs Stable Alert and Oriented x 3.  Pulses Are Palpable.  Neurologic System Is Intact He Has Limited Range Of Motion at the Ankle Joint Right with Spurring and Osseous Abnormalities That Are Present.  Assessment Capsulitis Osteoarthritis Ankle Right.  Plan: Discussed Etiology Pathology Conservative Surgical Therapies Inject Grams Kenalog  5 Mg of Marcaine  to the Medial Gutter of the Right Ankle after Sterile Betadine Skin Prep.  Tolerated Procedure Well.  Refilled His Meloxicam  for 1 Year.

## 2024-01-24 DIAGNOSIS — M546 Pain in thoracic spine: Secondary | ICD-10-CM | POA: Diagnosis not present

## 2024-01-24 DIAGNOSIS — M9901 Segmental and somatic dysfunction of cervical region: Secondary | ICD-10-CM | POA: Diagnosis not present

## 2024-01-24 DIAGNOSIS — M9902 Segmental and somatic dysfunction of thoracic region: Secondary | ICD-10-CM | POA: Diagnosis not present

## 2024-01-24 DIAGNOSIS — M5416 Radiculopathy, lumbar region: Secondary | ICD-10-CM | POA: Diagnosis not present

## 2024-01-24 DIAGNOSIS — M9906 Segmental and somatic dysfunction of lower extremity: Secondary | ICD-10-CM | POA: Diagnosis not present

## 2024-01-24 DIAGNOSIS — M5459 Other low back pain: Secondary | ICD-10-CM | POA: Diagnosis not present

## 2024-01-24 DIAGNOSIS — M542 Cervicalgia: Secondary | ICD-10-CM | POA: Diagnosis not present

## 2024-01-24 DIAGNOSIS — M9903 Segmental and somatic dysfunction of lumbar region: Secondary | ICD-10-CM | POA: Diagnosis not present

## 2024-01-27 DIAGNOSIS — M9901 Segmental and somatic dysfunction of cervical region: Secondary | ICD-10-CM | POA: Diagnosis not present

## 2024-01-27 DIAGNOSIS — M5416 Radiculopathy, lumbar region: Secondary | ICD-10-CM | POA: Diagnosis not present

## 2024-01-27 DIAGNOSIS — M9903 Segmental and somatic dysfunction of lumbar region: Secondary | ICD-10-CM | POA: Diagnosis not present

## 2024-01-27 DIAGNOSIS — M542 Cervicalgia: Secondary | ICD-10-CM | POA: Diagnosis not present

## 2024-01-27 DIAGNOSIS — M9906 Segmental and somatic dysfunction of lower extremity: Secondary | ICD-10-CM | POA: Diagnosis not present

## 2024-01-27 DIAGNOSIS — M9902 Segmental and somatic dysfunction of thoracic region: Secondary | ICD-10-CM | POA: Diagnosis not present

## 2024-01-27 DIAGNOSIS — M546 Pain in thoracic spine: Secondary | ICD-10-CM | POA: Diagnosis not present

## 2024-01-27 DIAGNOSIS — M5459 Other low back pain: Secondary | ICD-10-CM | POA: Diagnosis not present

## 2024-01-29 DIAGNOSIS — M9902 Segmental and somatic dysfunction of thoracic region: Secondary | ICD-10-CM | POA: Diagnosis not present

## 2024-01-29 DIAGNOSIS — M9901 Segmental and somatic dysfunction of cervical region: Secondary | ICD-10-CM | POA: Diagnosis not present

## 2024-01-29 DIAGNOSIS — M5416 Radiculopathy, lumbar region: Secondary | ICD-10-CM | POA: Diagnosis not present

## 2024-01-29 DIAGNOSIS — M9903 Segmental and somatic dysfunction of lumbar region: Secondary | ICD-10-CM | POA: Diagnosis not present

## 2024-01-29 DIAGNOSIS — M5459 Other low back pain: Secondary | ICD-10-CM | POA: Diagnosis not present

## 2024-01-29 DIAGNOSIS — M9906 Segmental and somatic dysfunction of lower extremity: Secondary | ICD-10-CM | POA: Diagnosis not present

## 2024-01-29 DIAGNOSIS — M542 Cervicalgia: Secondary | ICD-10-CM | POA: Diagnosis not present

## 2024-01-29 DIAGNOSIS — M546 Pain in thoracic spine: Secondary | ICD-10-CM | POA: Diagnosis not present

## 2024-01-31 ENCOUNTER — Encounter: Payer: Self-pay | Admitting: Sleep Medicine

## 2024-01-31 DIAGNOSIS — M9906 Segmental and somatic dysfunction of lower extremity: Secondary | ICD-10-CM | POA: Diagnosis not present

## 2024-01-31 DIAGNOSIS — M9902 Segmental and somatic dysfunction of thoracic region: Secondary | ICD-10-CM | POA: Diagnosis not present

## 2024-01-31 DIAGNOSIS — M5416 Radiculopathy, lumbar region: Secondary | ICD-10-CM | POA: Diagnosis not present

## 2024-01-31 DIAGNOSIS — M5459 Other low back pain: Secondary | ICD-10-CM | POA: Diagnosis not present

## 2024-01-31 DIAGNOSIS — M542 Cervicalgia: Secondary | ICD-10-CM | POA: Diagnosis not present

## 2024-01-31 DIAGNOSIS — M9903 Segmental and somatic dysfunction of lumbar region: Secondary | ICD-10-CM | POA: Diagnosis not present

## 2024-01-31 DIAGNOSIS — M9901 Segmental and somatic dysfunction of cervical region: Secondary | ICD-10-CM | POA: Diagnosis not present

## 2024-01-31 DIAGNOSIS — M546 Pain in thoracic spine: Secondary | ICD-10-CM | POA: Diagnosis not present

## 2024-02-02 ENCOUNTER — Other Ambulatory Visit: Payer: Self-pay | Admitting: Physician Assistant

## 2024-02-02 DIAGNOSIS — I10 Essential (primary) hypertension: Secondary | ICD-10-CM

## 2024-02-04 DIAGNOSIS — M9906 Segmental and somatic dysfunction of lower extremity: Secondary | ICD-10-CM | POA: Diagnosis not present

## 2024-02-04 DIAGNOSIS — M5416 Radiculopathy, lumbar region: Secondary | ICD-10-CM | POA: Diagnosis not present

## 2024-02-04 DIAGNOSIS — M542 Cervicalgia: Secondary | ICD-10-CM | POA: Diagnosis not present

## 2024-02-04 DIAGNOSIS — M5459 Other low back pain: Secondary | ICD-10-CM | POA: Diagnosis not present

## 2024-02-04 DIAGNOSIS — M9902 Segmental and somatic dysfunction of thoracic region: Secondary | ICD-10-CM | POA: Diagnosis not present

## 2024-02-04 DIAGNOSIS — M9903 Segmental and somatic dysfunction of lumbar region: Secondary | ICD-10-CM | POA: Diagnosis not present

## 2024-02-04 DIAGNOSIS — M546 Pain in thoracic spine: Secondary | ICD-10-CM | POA: Diagnosis not present

## 2024-02-04 DIAGNOSIS — M9901 Segmental and somatic dysfunction of cervical region: Secondary | ICD-10-CM | POA: Diagnosis not present

## 2024-02-05 ENCOUNTER — Ambulatory Visit (INDEPENDENT_AMBULATORY_CARE_PROVIDER_SITE_OTHER): Payer: Self-pay | Admitting: Urology

## 2024-02-05 ENCOUNTER — Ambulatory Visit (INDEPENDENT_AMBULATORY_CARE_PROVIDER_SITE_OTHER): Admitting: Orthopedic Surgery

## 2024-02-05 VITALS — BP 143/78 | HR 66 | Ht 75.0 in | Wt 291.0 lb

## 2024-02-05 VITALS — BP 160/79 | Ht 75.0 in | Wt 286.0 lb

## 2024-02-05 DIAGNOSIS — M545 Low back pain, unspecified: Secondary | ICD-10-CM

## 2024-02-05 DIAGNOSIS — M5416 Radiculopathy, lumbar region: Secondary | ICD-10-CM | POA: Diagnosis not present

## 2024-02-05 DIAGNOSIS — R972 Elevated prostate specific antigen [PSA]: Secondary | ICD-10-CM

## 2024-02-05 DIAGNOSIS — R350 Frequency of micturition: Secondary | ICD-10-CM

## 2024-02-05 DIAGNOSIS — M5459 Other low back pain: Secondary | ICD-10-CM | POA: Diagnosis not present

## 2024-02-05 DIAGNOSIS — M9906 Segmental and somatic dysfunction of lower extremity: Secondary | ICD-10-CM | POA: Diagnosis not present

## 2024-02-05 DIAGNOSIS — M546 Pain in thoracic spine: Secondary | ICD-10-CM | POA: Diagnosis not present

## 2024-02-05 DIAGNOSIS — M9903 Segmental and somatic dysfunction of lumbar region: Secondary | ICD-10-CM | POA: Diagnosis not present

## 2024-02-05 DIAGNOSIS — M9902 Segmental and somatic dysfunction of thoracic region: Secondary | ICD-10-CM | POA: Diagnosis not present

## 2024-02-05 DIAGNOSIS — M542 Cervicalgia: Secondary | ICD-10-CM | POA: Diagnosis not present

## 2024-02-05 DIAGNOSIS — M9901 Segmental and somatic dysfunction of cervical region: Secondary | ICD-10-CM | POA: Diagnosis not present

## 2024-02-05 NOTE — Patient Instructions (Signed)

## 2024-02-05 NOTE — Progress Notes (Signed)
 Elfrieda Grise Plume,acting as a scribe for Dustin Gimenez, MD.,have documented all relevant documentation on the behalf of Dustin Gimenez, MD,as directed by  Dustin Gimenez, MD while in the presence of Dustin Gimenez, MD.  02/05/24 12:20 PM   Warren [name] [place] Sep 09, 1951 [id number]  Referring provider: [name], PA-C [address] [place],  [place] [address]  Chief Complaint  Patient presents with   Follow-up   Elevated PSA    HPI: 73 year old male with a personal history of elevated PSA presents today for follow-up. His PSA normal baseline is in the 5-to-6 range with a negative biopsy in 2018. In 2023, his PSA rose to 8.6, and an MRI showed a 94-gram prostate with a PIRADS score of 1.   His most recent PSA, drawn at his PCP, was 7.52, which is a decrease from last year when it was between 9.4 and 9.1.   He reports some urinary frequency but otherwise has no complaints. He feels good about the recent PSA numbers and has no changes in urinary habits, such as starting or stopping difficulties. He attributes the frequency to diuretic use. He understands the need for annual follow-up and is due for a rectal exam today.   PMH: Past Medical History:  Diagnosis Date   Elevated PSA 06/07/2017   6.2 -- refer to urologist Sept 2018   Erectile dysfunction 02/27/2016   Hypercholesterolemia without hypertriglyceridemia 10/08/2007   Hypertension    Malignant hyperthermia 1986   "told I had maybe a mild case of imalignant hyperthermia at [facility]"   Obesity 02/24/2015   OSA on CPAP 02/24/2015   CPAP q night    Osteoarthritis    "feet and knees"  (10/31/2016)   PIN (prostatic intraepithelial neoplasia) 09/05/2017   Prostate biopsy Nov 2018    Surgical History: Past Surgical History:  Procedure Laterality Date   COLONOSCOPY WITH PROPOFOL  N/A 12/12/2022   Procedure: COLONOSCOPY WITH PROPOFOL ;  Surgeon: [name], MD;  Location: [facility] ENDOSCOPY;  Service: Gastroenterology;  Laterality:  N/A;   HAMMER TOE SURGERY Bilateral 11/2015 - 01/2016   "right-left"   JOINT REPLACEMENT     SOFT TISSUE BIOPSY Right ~ 1986   "@ [facility]"   TOTAL KNEE ARTHROPLASTY Right 08/22/2016   Procedure: TOTAL KNEE ARTHROPLASTY;  Surgeon: [name], MD;  Location: [facility] OR;  Service: Orthopedics;  Laterality: Right;   TOTAL KNEE ARTHROPLASTY Left 10/31/2016   TOTAL KNEE ARTHROPLASTY Left 10/31/2016   Procedure: TOTAL KNEE ARTHROPLASTY;  Surgeon: [name], MD;  Location: [facility] OR;  Service: Orthopedics;  Laterality: Left;    Home Medications:  Allergies as of 02/05/2024   No Known Allergies      Medication List        Accurate as of Feb 05, 2024 12:20 PM. If you have any questions, ask your nurse or doctor.          amLODipine  5 MG tablet Commonly known as: NORVASC  Take 1 tablet (5 mg total) by mouth daily.   aspirin  EC 81 MG tablet Take 1 tablet (81 mg total) by mouth daily.   Fish Oil 1200 MG Caps Take 1,200 mg by mouth.   meloxicam  15 MG tablet Commonly known as: MOBIC  Take 0.5-1 tablets (7.5-15 mg total) by mouth daily.   meloxicam  15 MG tablet Commonly known as: MOBIC  Take 1 tablet (15 mg total) by mouth daily.   rosuvastatin  5 MG tablet Commonly known as: CRESTOR  Take 1 tablet (5 mg total)  by mouth daily.   tadalafil  5 MG tablet Commonly known as: CIALIS  TAKE 1 TABLET (5 MG TOTAL) BY MOUTH DAILY.   telmisartan -hydrochlorothiazide  80-25 MG tablet Commonly known as: MICARDIS  HCT Take 1 tablet by mouth daily.        Family History: Family History  Problem Relation Age of Onset   Heart disease Mother    Stroke Mother    Suicidality Father    Cancer Sister        breast    Social History:  reports that he has never smoked. He has never used smokeless tobacco. He reports that he does not currently use alcohol. He reports that he does not use drugs.   Physical Exam: BP (!) 143/78   Pulse 66   Ht 6\' 3"  (1.905 m)   Wt 291 lb (132 kg)   BMI 36.37  kg/m   Constitutional:  Alert and oriented, No acute distress. HEENT: [place] AT, moist mucus membranes.  Trachea midline, no masses. GU: Enlarged prostate. No nodules palpated. Unable to reach the top of the prostate, but the middle and lower parts are palpable. Neurologic: Grossly intact, no focal deficits, moving all 4 extremities. Psychiatric: Normal mood and affect.   Assessment & Plan:    1. Elevated PSA - His PSA has decreased from 9.4-9.1 to 7.52, which is an improvement.  - Continue annual monitoring of PSA levels.  - No immediate intervention is required as the levels are stable and have decreased.  3. Urinary Frequency - He reports urinary frequency, likely secondary to diuretic use.  - No changes in urinary habits or significant symptoms of obstruction were noted.  - Continue current management and monitor symptoms.  Return in about 1 year (around 02/04/2025) for PA visit with repeat PSA.  I have reviewed the above documentation for accuracy and completeness, and I agree with the above.   Dustin Gimenez, MD    [facility] [address] [place], [place] [address] [phone]

## 2024-02-05 NOTE — Telephone Encounter (Signed)
 Refilled 01/03/24 # 90 with 1 refill. Requested Prescriptions  Refused Prescriptions Disp Refills   amLODipine  (NORVASC ) 5 MG tablet [Pharmacy Med Name: AMLODIPINE  BESYLATE 5 MG TAB] 135 tablet 1    Sig: TAKE 1 & 1/2 TABLET BY MOUTH DAILY     Cardiovascular: Calcium  Channel Blockers 2 Failed - 02/05/2024  1:52 PM      Failed - Last BP in normal range    BP Readings from Last 1 Encounters:  02/05/24 (!) 143/78         Passed - Last Heart Rate in normal range    Pulse Readings from Last 1 Encounters:  02/05/24 66         Passed - Valid encounter within last 6 months    Recent Outpatient Visits           1 month ago Adult general medical exam   [facility] [name], PA-C       Future Appointments             In 5 months [name], PA-C [facility], [facility]   In 12 months McGowan, Nyra Bellis [facility]

## 2024-02-07 ENCOUNTER — Encounter: Payer: Self-pay | Admitting: Orthopedic Surgery

## 2024-02-07 DIAGNOSIS — M9901 Segmental and somatic dysfunction of cervical region: Secondary | ICD-10-CM | POA: Diagnosis not present

## 2024-02-07 DIAGNOSIS — M546 Pain in thoracic spine: Secondary | ICD-10-CM | POA: Diagnosis not present

## 2024-02-07 DIAGNOSIS — M5416 Radiculopathy, lumbar region: Secondary | ICD-10-CM | POA: Diagnosis not present

## 2024-02-07 DIAGNOSIS — M9906 Segmental and somatic dysfunction of lower extremity: Secondary | ICD-10-CM | POA: Diagnosis not present

## 2024-02-07 DIAGNOSIS — M542 Cervicalgia: Secondary | ICD-10-CM | POA: Diagnosis not present

## 2024-02-07 DIAGNOSIS — M9902 Segmental and somatic dysfunction of thoracic region: Secondary | ICD-10-CM | POA: Diagnosis not present

## 2024-02-07 DIAGNOSIS — M5459 Other low back pain: Secondary | ICD-10-CM | POA: Diagnosis not present

## 2024-02-07 DIAGNOSIS — M9903 Segmental and somatic dysfunction of lumbar region: Secondary | ICD-10-CM | POA: Diagnosis not present

## 2024-02-07 NOTE — Progress Notes (Signed)
 New Patient Visit  Assessment: [name] is a 73 y.o. male with the following: Low back pain Left thigh pain  Plan: Warren [name] has some pain in the lower back, with some radiating symptoms in the left thigh.  Recently, it has improved.  He has been working with a Land.  He is very active, working as a Journalist, newspaper.  Description of his symptoms most likely related to his lower back.  Since his symptoms are improving, nothing further is needed at this time.  If he continues to have issues, would recommend imaging.  Since his current therapies are helping, he should continue with the meloxicam  and chiropractic care.  If he has further issues, he can return to clinic.  Follow-up: Return if symptoms worsen or fail to improve.  Subjective:  Chief Complaint  Patient presents with   Leg Pain    Patient said he is having pain in the left thigh  it gets tight when he stands and eases up when he sits and is now going on 2 months     History of Present Illness: Warren [name] is a 73 y.o. male who has been referred by [name], PA-C [name], PA-C for evaluation of left leg pain.  He states he started to have issues in his lower back, and left leg about 2 months ago.  He would have occasional pains, that caused his leg to buckle.  At times, he would feel cramping type pains in the left thigh.  He is preparing for right ankle surgery, and has been taking meloxicam .  There was a period of time where he stopped taking the meloxicam , and noticed the symptoms.  He is now back taking the meloxicam , the symptoms are getting better.  He is also started working with a [employer].  He states that this is giving him more mobility, and he feels better overall.  His symptoms are slowly getting better.  No numbness or tingling.  He does not have pain radiating down his leg.   Review of Systems: No fevers or chills No numbness or tingling No chest pain No shortness of breath No bowel or  bladder dysfunction No GI distress No headaches   Medical History:  Past Medical History:  Diagnosis Date   Elevated PSA 06/07/2017   6.2 -- refer to urologist Sept 2018   Erectile dysfunction 02/27/2016   Hypercholesterolemia without hypertriglyceridemia 10/08/2007   Hypertension    Malignant hyperthermia 1986   "told I had maybe a mild case of imalignant hyperthermia at [facility]"   Obesity 02/24/2015   OSA on CPAP 02/24/2015   CPAP q night    Osteoarthritis    "feet and knees"  (10/31/2016)   PIN (prostatic intraepithelial neoplasia) 09/05/2017   Prostate biopsy Nov 2018    Past Surgical History:  Procedure Laterality Date   COLONOSCOPY WITH PROPOFOL  N/A 12/12/2022   Procedure: COLONOSCOPY WITH PROPOFOL ;  Surgeon: [name], MD;  Location: [facility] ENDOSCOPY;  Service: Gastroenterology;  Laterality: N/A;   HAMMER TOE SURGERY Bilateral 11/2015 - 01/2016   "right-left"   JOINT REPLACEMENT     SOFT TISSUE BIOPSY Right ~ 1986   "@ [facility]"   TOTAL KNEE ARTHROPLASTY Right 08/22/2016   Procedure: TOTAL KNEE ARTHROPLASTY;  Surgeon: [name], MD;  Location: [facility] OR;  Service: Orthopedics;  Laterality: Right;   TOTAL KNEE ARTHROPLASTY Left 10/31/2016   TOTAL KNEE ARTHROPLASTY Left 10/31/2016   Procedure: TOTAL KNEE ARTHROPLASTY;  Surgeon: [name]  [name], MD;  Location: [facility] OR;  Service: Orthopedics;  Laterality: Left;    Family History  Problem Relation Age of Onset   Heart disease Mother    Stroke Mother    Suicidality Father    Cancer Sister        breast   Social History   Tobacco Use   Smoking status: Never   Smokeless tobacco: Never  Vaping Use   Vaping status: Never Used  Substance Use Topics   Alcohol use: Not Currently    Comment: 10/31/2016 "wine a couple times/month"   Drug use: No    No Known Allergies  Current Meds  Medication Sig   amLODipine  (NORVASC ) 5 MG tablet Take 1 tablet (5 mg total) by mouth daily.   aspirin  EC 81 MG tablet Take 1 tablet (81 mg total)  by mouth daily.   meloxicam  (MOBIC ) 15 MG tablet Take 0.5-1 tablets (7.5-15 mg total) by mouth daily.   meloxicam  (MOBIC ) 15 MG tablet Take 1 tablet (15 mg total) by mouth daily.   Omega-3 Fatty Acids (FISH OIL) 1200 MG CAPS Take 1,200 mg by mouth.   rosuvastatin  (CRESTOR ) 5 MG tablet Take 1 tablet (5 mg total) by mouth daily.   tadalafil  (CIALIS ) 5 MG tablet TAKE 1 TABLET (5 MG TOTAL) BY MOUTH DAILY.   telmisartan -hydrochlorothiazide  (MICARDIS  HCT) 80-25 MG tablet Take 1 tablet by mouth daily.    Objective: BP (!) 160/79   Ht 6\' 3"  (1.905 m)   Wt 286 lb (129.7 kg)   BMI 35.75 kg/m   Physical Exam:  General: Alert and oriented. and No acute distress. Gait: Leaning forward when walking.  No deformity lower back.  No tenderness.  Negative straight leg raise.  He tolerates gentle range of motion of the left hip.  No pain in the groin.  He has good lower body strength.   IMAGING: No new imaging obtained today   New Medications:  No orders of the defined types were placed in this encounter.     Tonita Frater, MD  02/07/2024 12:10 PM

## 2024-02-12 DIAGNOSIS — M5459 Other low back pain: Secondary | ICD-10-CM | POA: Diagnosis not present

## 2024-02-12 DIAGNOSIS — M542 Cervicalgia: Secondary | ICD-10-CM | POA: Diagnosis not present

## 2024-02-12 DIAGNOSIS — M9903 Segmental and somatic dysfunction of lumbar region: Secondary | ICD-10-CM | POA: Diagnosis not present

## 2024-02-12 DIAGNOSIS — M9906 Segmental and somatic dysfunction of lower extremity: Secondary | ICD-10-CM | POA: Diagnosis not present

## 2024-02-12 DIAGNOSIS — M9902 Segmental and somatic dysfunction of thoracic region: Secondary | ICD-10-CM | POA: Diagnosis not present

## 2024-02-12 DIAGNOSIS — M9901 Segmental and somatic dysfunction of cervical region: Secondary | ICD-10-CM | POA: Diagnosis not present

## 2024-02-12 DIAGNOSIS — M5416 Radiculopathy, lumbar region: Secondary | ICD-10-CM | POA: Diagnosis not present

## 2024-02-12 DIAGNOSIS — M546 Pain in thoracic spine: Secondary | ICD-10-CM | POA: Diagnosis not present

## 2024-04-25 ENCOUNTER — Other Ambulatory Visit: Payer: Self-pay | Admitting: Family Medicine

## 2024-04-25 DIAGNOSIS — E782 Mixed hyperlipidemia: Secondary | ICD-10-CM

## 2024-04-28 NOTE — Telephone Encounter (Signed)
 Requested Prescriptions  Refused Prescriptions Disp Refills   rosuvastatin  (CRESTOR ) 5 MG tablet [Pharmacy Med Name: ROSUVASTATIN  CALCIUM  5 MG TAB] 90 tablet 1    Sig: TAKE 1 TABLET (5 MG TOTAL) BY MOUTH DAILY.     Cardiovascular:  Antilipid - Statins 2 Failed - 04/28/2024  1:56 PM      Failed - Lipid Panel in normal range within the last 12 months    Cholesterol, Total  Date Value Ref Range Status  01/28/2018 170 100 - 199 mg/dL Final   Cholesterol  Date Value Ref Range Status  01/03/2024 147 <200 mg/dL Final   LDL Cholesterol (Calc)  Date Value Ref Range Status  01/03/2024 81 mg/dL (calc) Final    Comment:    Reference range: <100 . Desirable range <100 mg/dL for primary prevention;   <70 mg/dL for patients with CHD or diabetic patients  with > or = 2 CHD risk factors. SABRA LDL-C is now calculated using the Martin-Hopkins  calculation, which is a validated novel method providing  better accuracy than the Friedewald equation in the  estimation of LDL-C.  Gladis APPLETHWAITE et al. SANDREA. 7986;689(80): 2061-2068  (http://education.[employer].com/faq/FAQ164)    HDL  Date Value Ref Range Status  01/03/2024 41 > OR = 40 mg/dL Final  94/78/7980 37 (L) >39 mg/dL Final   Triglycerides  Date Value Ref Range Status  01/03/2024 152 (H) <150 mg/dL Final         Passed - Cr in normal range and within 360 days    Creat  Date Value Ref Range Status  01/03/2024 1.00 0.70 - 1.28 mg/dL Final         Passed - Patient is not pregnant      Passed - Valid encounter within last 12 months    Recent Outpatient Visits           3 months ago Adult general medical exam   [facility] Leavy Mole, PA-C       Future Appointments             In 2 months Leavy Mole, PA-C [facility]   In 9 months McGowan, Clotilda DELENA RIGGERS [facility]

## 2024-06-17 ENCOUNTER — Encounter: Payer: Self-pay | Admitting: Family Medicine

## 2024-06-17 ENCOUNTER — Ambulatory Visit

## 2024-06-17 ENCOUNTER — Ambulatory Visit (INDEPENDENT_AMBULATORY_CARE_PROVIDER_SITE_OTHER): Admitting: Podiatry

## 2024-06-17 DIAGNOSIS — M19071 Primary osteoarthritis, right ankle and foot: Secondary | ICD-10-CM | POA: Diagnosis not present

## 2024-06-17 DIAGNOSIS — M19079 Primary osteoarthritis, unspecified ankle and foot: Secondary | ICD-10-CM

## 2024-06-17 MED ORDER — TRIAMCINOLONE ACETONIDE 40 MG/ML IJ SUSP
20.0000 mg | Freq: Once | INTRAMUSCULAR | Status: AC
  Administered 2024-06-17: 20 mg

## 2024-06-17 NOTE — Progress Notes (Signed)
 He presents today after having not seen him for about 5 months with chief concern of pain to the medial ankle.  States that in the mornings he can park hardly put weight on it but by midday he is able to walk on it okay but in the evenings he gets tight.  Objective: Vital signs are stable he is alert and oriented x 3 he has good dorsiflexion and plantarflexion plantarflexion is limited it feels like there is a bony block posteriorly.  Also demonstrates no crepitation on range of motion.  Just tenderness on palpation to the medial ankle and anterior ankle.  Assessment: Ankle joint arthritis.  Plan: Discussed etiology pathology conservative surgical therapies after sterile Betadine skin prep I injected the ankle joint with 2 cc of a 50-50 mixture of Marcaine  plain and 20 mg of Kenalog .  We were successful as to getting it into the joint.  He related that it felt much better before he left the office.  Follow-up with me on an as-needed basis I did recommend that he could MyChart message me or call and request CT to if this does not last.  We will go ahead and perform a CT of his left ankle joint.

## 2024-07-06 ENCOUNTER — Ambulatory Visit: Admitting: Family Medicine

## 2024-07-08 ENCOUNTER — Ambulatory Visit (INDEPENDENT_AMBULATORY_CARE_PROVIDER_SITE_OTHER): Admitting: Nurse Practitioner

## 2024-07-08 ENCOUNTER — Other Ambulatory Visit: Payer: Self-pay

## 2024-07-08 VITALS — BP 132/74 | HR 77 | Temp 98.2°F | Resp 16 | Ht 74.0 in | Wt 284.1 lb

## 2024-07-08 DIAGNOSIS — Z6836 Body mass index (BMI) 36.0-36.9, adult: Secondary | ICD-10-CM | POA: Diagnosis not present

## 2024-07-08 DIAGNOSIS — E782 Mixed hyperlipidemia: Secondary | ICD-10-CM | POA: Diagnosis not present

## 2024-07-08 DIAGNOSIS — J301 Allergic rhinitis due to pollen: Secondary | ICD-10-CM | POA: Diagnosis not present

## 2024-07-08 DIAGNOSIS — E785 Hyperlipidemia, unspecified: Secondary | ICD-10-CM

## 2024-07-08 DIAGNOSIS — Z131 Encounter for screening for diabetes mellitus: Secondary | ICD-10-CM

## 2024-07-08 DIAGNOSIS — Z23 Encounter for immunization: Secondary | ICD-10-CM

## 2024-07-08 DIAGNOSIS — M1990 Unspecified osteoarthritis, unspecified site: Secondary | ICD-10-CM | POA: Insufficient documentation

## 2024-07-08 DIAGNOSIS — E66812 Obesity, class 2: Secondary | ICD-10-CM

## 2024-07-08 DIAGNOSIS — G4733 Obstructive sleep apnea (adult) (pediatric): Secondary | ICD-10-CM

## 2024-07-08 DIAGNOSIS — I10 Essential (primary) hypertension: Secondary | ICD-10-CM | POA: Diagnosis not present

## 2024-07-08 MED ORDER — ROSUVASTATIN CALCIUM 5 MG PO TABS: 5.0000 mg | ORAL_TABLET | Freq: Every day | ORAL | 1 refills | Status: AC

## 2024-07-08 MED ORDER — AMLODIPINE BESYLATE 5 MG PO TABS: 5.0000 mg | ORAL_TABLET | Freq: Every day | ORAL | 1 refills | Status: AC

## 2024-07-08 MED ORDER — TELMISARTAN-HCTZ 80-25 MG PO TABS: 1.0000 | ORAL_TABLET | Freq: Every day | ORAL | 1 refills | Status: AC

## 2024-07-08 NOTE — Progress Notes (Signed)
 BP 132/74 (Cuff Size: Large)   Pulse 77   Temp 98.2 F (36.8 C) (Oral)   Resp 16   Ht 6' 2 (1.88 m)   Wt 284 lb 1.6 oz (128.9 kg)   SpO2 99%   BMI 36.48 kg/m    Subjective:    Patient ID: Warren [name], male    DOB: [birth year], 73 y.o.   MRN: [id number]  HPI: Warren [name] is a 73 y.o. male  Chief Complaint  Patient presents with   Medical Management of Chronic Issues    6 month recheck   Discussed the use of AI scribe software for clinical note transcription with the patient, who gave verbal consent to proceed.  History of Present Illness Warren [name] is a 73 year old male with hypertension, sleep apnea, and arthritis who presents for a six-month follow-up.  Hypertension and cardiovascular risk management - Blood pressure readings typically around 120/70 mmHg, occasionally reaching 130/84 mmHg - Improved consistency in blood pressure control - Current medications: amlodipine  5 mg daily, telmisartan -hydrochlorothiazide  80/25 mg daily, aspirin  81 mg daily, rosuvastatin  5 mg daily - No recent spikes in blood pressure - LDL 81 mg/dL, triglycerides 847 mg/dL, J8r 4.3% Lipid Panel     Component Value Date/Time   CHOL 147 01/03/2024 0944   CHOL 170 01/28/2018 0823   TRIG 152 (H) 01/03/2024 0944   HDL 41 01/03/2024 0944   HDL 37 (L) 01/28/2018 0823   CHOLHDL 3.6 01/03/2024 0944   VLDL 74 (H) 05/15/2016 1105   LDLCALC 81 01/03/2024 0944   LABVLDL 15 01/28/2018 0823     Obstructive sleep apnea - Uses CPAP machine nightly - Significant improvement in sleep quality with CPAP use  Prostate-specific antigen elevation - Elevated PSA, last reading 7.52 in April 2025 - PSA stable under urological monitoring Lab Results  Component Value Date   PSA1 9.1 (H) 03/25/2023   PSA1 9.4 (H) 02/05/2023   PSA1 8.6 (H) 08/01/2022   PSA 7.52 (H) 01/03/2024   PSA 5.90 (H) 04/25/2021   PSA 6.30 (H) 01/31/2021      Chronic ankle pain and instability - Arthritis-related ankle  pain persisting since 2010 - Cortisone injections have become less effective - Hesitant about ankle replacement surgery due to concerns about efficacy - Takes meloxicam  15 mg daily, which helps with inflammation, especially noticeable when he runs out of it - Avoids additional pain medication due to adverse effects - Ankle pain and instability, especially after prolonged sitting     Wt Readings from Last 3 Encounters:  07/08/24 284 lb 1.6 oz (128.9 kg)  02/05/24 286 lb (129.7 kg)  02/05/24 291 lb (132 kg)   Body mass index is 36.48 kg/m.      01/03/2024    8:37 AM 11/28/2023    8:17 AM 07/23/2023    8:09 AM  Depression screen PHQ 2/9  Decreased Interest 0 0 0  Down, Depressed, Hopeless 0 0 0  PHQ - 2 Score 0 0 0  Altered sleeping 0 0 0  Tired, decreased energy 0 0 0  Change in appetite 0 0 0  Feeling bad or failure about yourself  0 0 0  Trouble concentrating 0 0 0  Moving slowly or fidgety/restless 0 0 0  Suicidal thoughts 0 0 0  PHQ-9 Score 0 0 0  Difficult doing work/chores Not difficult at all Not difficult at all Not difficult at all    Relevant past medical, surgical, family and social history  reviewed and updated as indicated. Interim medical history since our last visit reviewed. Allergies and medications reviewed and updated.  Review of Systems  Ten systems reviewed and is negative except as mentioned in HPI      Objective:      BP 132/74 (Cuff Size: Large)   Pulse 77   Temp 98.2 F (36.8 C) (Oral)   Resp 16   Ht 6' 2 (1.88 m)   Wt 284 lb 1.6 oz (128.9 kg)   SpO2 99%   BMI 36.48 kg/m    Wt Readings from Last 3 Encounters:  07/08/24 284 lb 1.6 oz (128.9 kg)  02/05/24 286 lb (129.7 kg)  02/05/24 291 lb (132 kg)    Physical Exam VITALS: BP- 132/74 MEASUREMENTS: Weight- 284, BMI- 36.48. GENERAL: Alert, cooperative, well developed, no acute distress HEENT: Normocephalic, normal oropharynx, moist mucous membranes CHEST: Clear to auscultation  bilaterally, no wheezes, rhonchi, or crackles CARDIOVASCULAR: Normal heart rate and rhythm, S1 and S2 normal without murmurs ABDOMEN: Soft, non-tender, non-distended, without organomegaly, normal bowel sounds EXTREMITIES: No cyanosis or edema NEUROLOGICAL: Cranial nerves grossly intact, moves all extremities without gross motor or sensory deficit  Results for orders placed or performed in visit on 01/03/24  CBC with Differential/Platelet   Collection Time: 01/03/24  9:44 AM  Result Value Ref Range   WBC 6.6 3.8 - 10.8 Thousand/uL   RBC 4.99 4.20 - 5.80 Million/uL   Hemoglobin 15.0 13.2 - 17.1 g/dL   HCT 55.7 61.4 - 49.9 %   MCV 88.6 80.0 - 100.0 fL   MCH 30.1 27.0 - 33.0 pg   MCHC 33.9 32.0 - 36.0 g/dL   RDW 87.3 88.9 - 84.9 %   Platelets 274 140 - 400 Thousand/uL   MPV 10.1 7.5 - 12.5 fL   Neutro Abs 3,736 1,500 - 7,800 cells/uL   Absolute Lymphocytes 1,940 850 - 3,900 cells/uL   Absolute Monocytes 502 200 - 950 cells/uL   Eosinophils Absolute 363 15 - 500 cells/uL   Basophils Absolute 59 0 - 200 cells/uL   Neutrophils Relative % 56.6 %   Total Lymphocyte 29.4 %   Monocytes Relative 7.6 %   Eosinophils Relative 5.5 %   Basophils Relative 0.9 %  Comprehensive metabolic panel with GFR   Collection Time: 01/03/24  9:44 AM  Result Value Ref Range   Glucose, Bld 94 65 - 99 mg/dL   BUN 20 7 - 25 mg/dL   Creat 8.99 9.29 - 8.71 mg/dL   eGFR 80 > OR = 60 fO/fpw/8.26f7   BUN/Creatinine Ratio SEE NOTE: 6 - 22 (calc)   Sodium 141 135 - 146 mmol/L   Potassium 3.7 3.5 - 5.3 mmol/L   Chloride 104 98 - 110 mmol/L   CO2 27 20 - 32 mmol/L   Calcium  9.4 8.6 - 10.3 mg/dL   Total Protein 7.2 6.1 - 8.1 g/dL   Albumin 4.5 3.6 - 5.1 g/dL   Globulin 2.7 1.9 - 3.7 g/dL (calc)   AG Ratio 1.7 1.0 - 2.5 (calc)   Total Bilirubin 1.0 0.2 - 1.2 mg/dL   Alkaline phosphatase (APISO) 45 35 - 144 U/L   AST 32 10 - 35 U/L   ALT 35 9 - 46 U/L  Hemoglobin A1c   Collection Time: 01/03/24  9:44 AM   Result Value Ref Range   Hgb A1c MFr Bld 5.6 <5.7 %   Mean Plasma Glucose 114 mg/dL   eAG (mmol/L) 6.3 mmol/L  Lipid panel  Collection Time: 01/03/24  9:44 AM  Result Value Ref Range   Cholesterol 147 <200 mg/dL   HDL 41 > OR = 40 mg/dL   Triglycerides 847 (H) <150 mg/dL   LDL Cholesterol (Calc) 81 mg/dL (calc)   Total CHOL/HDL Ratio 3.6 <5.0 (calc)   Non-HDL Cholesterol (Calc) 106 <130 mg/dL (calc)  PSA   Collection Time: 01/03/24  9:44 AM  Result Value Ref Range   PSA 7.52 (H) < OR = 4.00 ng/mL          Assessment & Plan:   Problem List Items Addressed This Visit       Cardiovascular and Mediastinum   Hypertension   Relevant Medications   amLODipine  (NORVASC ) 5 MG tablet   rosuvastatin  (CRESTOR ) 5 MG tablet   telmisartan -hydrochlorothiazide  (MICARDIS  HCT) 80-25 MG tablet   Other Relevant Orders   CBC with Differential/Platelet   Comprehensive metabolic panel with GFR     Respiratory   Allergic rhinitis   OSA on CPAP     Musculoskeletal and Integument   Arthritis     Other   Hyperlipidemia (Chronic)   Relevant Medications   amLODipine  (NORVASC ) 5 MG tablet   rosuvastatin  (CRESTOR ) 5 MG tablet   telmisartan -hydrochlorothiazide  (MICARDIS  HCT) 80-25 MG tablet   Other Relevant Orders   Comprehensive metabolic panel with GFR   Lipid panel   Class 2 severe obesity with serious comorbidity and body mass index (BMI) of 36.0 to 36.9 in adult   Other Visit Diagnoses       Immunization due    -  Primary     Screening for diabetes mellitus       Relevant Orders   Comprehensive metabolic panel with GFR   Hemoglobin A1c     Benign essential HTN  (Chronic)      meds refilled at current doses, BP at goal for age   Relevant Medications   amLODipine  (NORVASC ) 5 MG tablet   rosuvastatin  (CRESTOR ) 5 MG tablet   telmisartan -hydrochlorothiazide  (MICARDIS  HCT) 80-25 MG tablet        Assessment and Plan Assessment & Plan Hypertension Well-controlled with  current medication regimen. Home blood pressure readings are consistently around 120/70 mmHg, with occasional readings up to 130/84 mmHg. No recent spikes in blood pressure reported. - Continue current antihypertensive medications: amlodipine  5 mg daily, telmisartan -hydrochlorothiazide  80-25 mg daily.  Obstructive sleep apnea Managed with CPAP therapy. He reports consistent nightly use of CPAP, which has significantly improved sleep quality. - Continue CPAP therapy nightly.  Obesity BMI of 36.48. Weight is 284 pounds. - Recommended a well-balanced diet with portion control.  Hyperlipidemia Managed with rosuvastatin . Recent lipid panel shows LDL at 81 mg/dL and triglycerides at 847 mg/dL, indicating good control. - Continue rosuvastatin  5 mg daily.  Ankle osteoarthritis Chronic ankle osteoarthritis with significant pain and functional limitations. He has been using meloxicam  for inflammation, which provides some relief. He is hesitant about ankle replacement surgery due to concerns about its efficacy compared to knee replacements. He uses a walking aid due to fear of instability when standing after sitting. - Continue meloxicam  15 mg daily for inflammation.  General Health Maintenance General health maintenance is up to date. Blood work is performed every six months. PSA is monitored annually by urology. - Ordered routine blood work today. - Continue annual PSA monitoring with urology.        Follow up plan: Return in about 6 months (around 01/06/2025) for follow up.

## 2024-07-13 ENCOUNTER — Encounter: Payer: Self-pay | Admitting: Radiology

## 2024-07-28 ENCOUNTER — Other Ambulatory Visit: Payer: Self-pay | Admitting: Family Medicine

## 2024-07-31 NOTE — Telephone Encounter (Signed)
 Requested Prescriptions  Refused Prescriptions Disp Refills   meloxicam  (MOBIC ) 15 MG tablet [Pharmacy Med Name: MELOXICAM  15 MG TABLET] 90 tablet 3    Sig: TAKE 1/2 TO 1 TABLET (7.5-15 MG TOTAL) BY MOUTH DAILY     Analgesics:  COX2 Inhibitors Failed - 07/31/2024 10:41 AM      Failed - Manual Review: Labs are only required if the patient has taken medication for more than 8 weeks.      Passed - HGB in normal range and within 360 days    Hemoglobin  Date Value Ref Range Status  01/03/2024 15.0 13.2 - 17.1 g/dL Final         Passed - Cr in normal range and within 360 days    Creat  Date Value Ref Range Status  01/03/2024 1.00 0.70 - 1.28 mg/dL Final         Passed - HCT in normal range and within 360 days    HCT  Date Value Ref Range Status  01/03/2024 44.2 38.5 - 50.0 % Final         Passed - AST in normal range and within 360 days    AST  Date Value Ref Range Status  01/03/2024 32 10 - 35 U/L Final         Passed - ALT in normal range and within 360 days    ALT  Date Value Ref Range Status  01/03/2024 35 9 - 46 U/L Final         Passed - eGFR is 30 or above and within 360 days    GFR, Est African American  Date Value Ref Range Status  12/24/2019 83 > OR = 60 mL/min/1.71m2 Final   GFR, Est Non African American  Date Value Ref Range Status  12/24/2019 71 > OR = 60 mL/min/1.6m2 Final   eGFR  Date Value Ref Range Status  01/03/2024 80 > OR = 60 mL/min/1.49m2 Final         Passed - Patient is not pregnant      Passed - Valid encounter within last 12 months    Recent Outpatient Visits           3 weeks ago Immunization due   [facility] Gareth Mliss FALCON, FNP   7 months ago Adult general medical exam   [facility] Leavy Mole, PA-C       Future Appointments             In 6 months McGowan, Clotilda DELENA RIGGERS [facility] [place]

## 2024-08-11 ENCOUNTER — Encounter: Payer: Self-pay | Admitting: Nurse Practitioner

## 2024-08-11 DIAGNOSIS — D2230 Melanocytic nevi of unspecified part of face: Secondary | ICD-10-CM

## 2024-08-20 ENCOUNTER — Other Ambulatory Visit: Payer: Self-pay | Admitting: Nurse Practitioner

## 2024-08-20 DIAGNOSIS — D229 Melanocytic nevi, unspecified: Secondary | ICD-10-CM

## 2024-08-23 NOTE — Progress Notes (Unsigned)
 [facility] [address] [place], [place] [address]  Pulmonary Sleep Medicine   Office Visit Note  Patient Name: Warren [name] DOB: 1951-01-13 MRN [id number]    Chief Complaint: Obstructive Sleep Apnea visit  Brief History:  Warren [name] is seen today for an annual follow up visit for CPAP@ 8 cmH2O. The patient has a 9 year history of sleep apnea. Patient is using PAP nightly.  The patient feels rested after sleeping with PAP.  The patient reports benefiting from PAP use. Reported sleepiness is  improved and the Epworth Sleepiness Score is 3 out of 24. The patient will seldom take naps. The patient complains of the following: pt is in need of supplies.  The compliance download shows 99% compliance with an average use time of 6 hours 57 minutes. The AHI is 0.4.  The patient does not complain of limb movements disrupting sleep. The patient continues to require PAP therapy in order to eliminate sleep apnea.   ROS  General: (-) fever, (-) chills, (-) night sweat Nose and Sinuses: (-) nasal stuffiness or itchiness, (-) postnasal drip, (-) nosebleeds, (-) sinus trouble. Mouth and Throat: (-) sore throat, (-) hoarseness. Neck: (-) swollen glands, (-) enlarged thyroid, (-) neck pain. Respiratory: - cough, - shortness of breath, - wheezing. Neurologic: - numbness, - tingling. Psychiatric: - anxiety, - depression   Current Medication: Outpatient Encounter Medications as of 08/24/2024  Medication Sig   amLODipine  (NORVASC ) 5 MG tablet Take 1 tablet (5 mg total) by mouth daily.   aspirin  EC 81 MG tablet Take 1 tablet (81 mg total) by mouth daily.   meloxicam  (MOBIC ) 15 MG tablet Take 1 tablet (15 mg total) by mouth daily.   Omega-3 Fatty Acids (FISH OIL) 1200 MG CAPS Take 1,200 mg by mouth.   rosuvastatin  (CRESTOR ) 5 MG tablet Take 1 tablet (5 mg total) by mouth daily.   tadalafil  (CIALIS ) 5 MG tablet TAKE 1 TABLET (5 MG TOTAL) BY MOUTH DAILY.   telmisartan -hydrochlorothiazide   (MICARDIS  HCT) 80-25 MG tablet Take 1 tablet by mouth daily.   No facility-administered encounter medications on file as of 08/24/2024.    Surgical History: Past Surgical History:  Procedure Laterality Date   COLONOSCOPY WITH PROPOFOL  N/A 12/12/2022   Procedure: COLONOSCOPY WITH PROPOFOL ;  Surgeon: Therisa Bi, MD;  Location: [facility] ENDOSCOPY;  Service: Gastroenterology;  Laterality: N/A;   HAMMER TOE SURGERY Bilateral 11/2015 - 01/2016   right-left   JOINT REPLACEMENT     SOFT TISSUE BIOPSY Right ~ 1986   @ [facility]   TOTAL KNEE ARTHROPLASTY Right 08/22/2016   Procedure: TOTAL KNEE ARTHROPLASTY;  Surgeon: Toribio JULIANNA Chancy, MD;  Location: [facility] OR;  Service: Orthopedics;  Laterality: Right;   TOTAL KNEE ARTHROPLASTY Left 10/31/2016   TOTAL KNEE ARTHROPLASTY Left 10/31/2016   Procedure: TOTAL KNEE ARTHROPLASTY;  Surgeon: Toribio JULIANNA Chancy, MD;  Location: [facility] OR;  Service: Orthopedics;  Laterality: Left;    Medical History: Past Medical History:  Diagnosis Date   Elevated PSA 06/07/2017   6.2 -- refer to urologist Sept 2018   Erectile dysfunction 02/27/2016   Hypercholesterolemia without hypertriglyceridemia 10/08/2007   Hypertension    Malignant hyperthermia 1986   told I had maybe a mild case of imalignant hyperthermia at [facility]   Obesity 02/24/2015   OSA on CPAP 02/24/2015   CPAP q night    Osteoarthritis    feet and knees  (10/31/2016)   PIN (prostatic intraepithelial neoplasia) 09/05/2017   Prostate biopsy Nov 2018  Family History: Non contributory to the present illness  Social History: Social History   Socioeconomic History   Marital status: Married    Spouse name: Not on file   Number of children: 2   Years of education: Not on file   Highest education level: Associate degree: occupational, scientist, product/process development, or vocational program  Occupational History   Not on file  Tobacco Use   Smoking status: Never   Smokeless tobacco: Never  Vaping Use   Vaping status: Never Used   Substance and Sexual Activity   Alcohol use: Not Currently    Comment: 10/31/2016 wine a couple times/month   Drug use: No   Sexual activity: Yes    Partners: Female  Other Topics Concern   Not on file  Social History Narrative   Not on file   Social Drivers of Health   Tobacco Use: Low Risk (08/24/2024)   Patient History    Smoking Tobacco Use: Never    Smokeless Tobacco Use: Never    Passive Exposure: Not on file  Financial Resource Strain: Low Risk (01/03/2024)   Overall Financial Resource Strain (CARDIA)    Difficulty of Paying Living Expenses: Not hard at all  Food Insecurity: No Food Insecurity (01/03/2024)   Hunger Vital Sign    Worried About Running Out of Food in the Last Year: Never true    Ran Out of Food in the Last Year: Never true  Transportation Needs: No Transportation Needs (01/03/2024)   PRAPARE - Administrator, Civil Service (Medical): No    Lack of Transportation (Non-Medical): No  Physical Activity: Sufficiently Active (01/03/2024)   Exercise Vital Sign    Days of Exercise per Week: 3 days    Minutes of Exercise per Session: 80 min  Stress: No Stress Concern Present (01/03/2024)   [employer] of Occupational Health - Occupational Stress Questionnaire    Feeling of Stress : Not at all  Social Connections: Socially Integrated (01/03/2024)   Social Connection and Isolation Panel    Frequency of Communication with Friends and Family: Twice a week    Frequency of Social Gatherings with Friends and Family: Once a week    Attends Religious Services: More than 4 times per year    Active Member of [employer] or Organizations: Yes    Attends Banker Meetings: More than 4 times per year    Marital Status: Married  Catering Manager Violence: Not At Risk (01/03/2024)   Humiliation, Afraid, Rape, and Kick questionnaire    Fear of Current or Ex-Partner: No    Emotionally Abused: No    Physically Abused: No    Sexually Abused: No   Depression (PHQ2-9): Low Risk (01/03/2024)   Depression (PHQ2-9)    PHQ-2 Score: 0  Alcohol Screen: Low Risk (11/28/2023)   Alcohol Screen    Last Alcohol Screening Score (AUDIT): 2  Housing: Low Risk (01/03/2024)   Housing Stability Vital Sign    Unable to Pay for Housing in the Last Year: No    Number of Times Moved in the Last Year: 0    Homeless in the Last Year: No  Utilities: Not At Risk (01/03/2024)   AHC Utilities    Threatened with loss of utilities: No  Health Literacy: Adequate Health Literacy (01/03/2024)   B1300 Health Literacy    Frequency of need for help with medical instructions: Never    Vital Signs: Blood pressure (!) 166/93, pulse 89, resp. rate 16, height 6' 2 (1.88 m),  weight 286 lb (129.7 kg), SpO2 96%. Body mass index is 36.72 kg/m.    Examination: General Appearance: The patient is well-developed, well-nourished, and in no distress. Neck Circumference: 43 cm Skin: Gross inspection of skin unremarkable. Head: normocephalic, no gross deformities. Eyes: no gross deformities noted. ENT: ears appear grossly normal Neurologic: Alert and oriented. No involuntary movements.  STOP BANG RISK ASSESSMENT S (snore) Have you been told that you snore?     NO   T (tired) Are you often tired, fatigued, or sleepy during the day?   NO  O (obstruction) Do you stop breathing, choke, or gasp during sleep? NO   P (pressure) Do you have or are you being treated for high blood pressure? YES   B (BMI) Is your body index greater than 35 kg/m? YES   A (age) Are you 41 years old or older? YES   N (neck) Do you have a neck circumference greater than 16 inches?   YES   G (gender) Are you a male? YES   TOTAL STOP/BANG YES ANSWERS 5       A STOP-Bang score of 2 or less is considered low risk, and a score of 5 or more is high risk for having either moderate or severe OSA. For people who score 3 or 4, doctors may need to perform further assessment to determine how likely  they are to have OSA.         EPWORTH SLEEPINESS SCALE:  Scale:  (0)= no chance of dozing; (1)= slight chance of dozing; (2)= moderate chance of dozing; (3)= high chance of dozing  Chance  Situtation    Sitting and reading: 1    Watching TV: 1    Sitting Inactive in public: 0    As a passenger in car: 0      Lying down to rest: 1    Sitting and talking: 0    Sitting quielty after lunch: 0    In a car, stopped in traffic: 0   TOTAL SCORE:   3 out of 24    SLEEP STUDIES:  Split Study (06/2014) AHI 17.4/hr, Supine AHI 40/hr, min SpO2 87%, CPAP@ 8 cmH2O   CPAP COMPLIANCE DATA:  Date Range: 08/21/2023-08/19/2024  Average Daily Use: 6 hours 57 minutes  Median Use: 6 hours 49 minutes  Compliance for > 4 Hours: 99%  AHI: 0.4 respiratory events per hour  Days Used: 365/365 days  Mask Leak: 38.1  95th Percentile Pressure: 8         LABS: No results found for this or any previous visit (from the past 2160 hours).  Radiology: No results found.  No results found.  No results found.    Assessment and Plan: Patient Active Problem List   Diagnosis Date Noted   OSA (obstructive sleep apnea) 08/24/2024   Arthritis 07/08/2024   CPAP use counseling 08/19/2023   Hypertension 08/19/2023   OSA on CPAP 10/31/2020   PIN (prostatic intraepithelial neoplasia) 09/05/2017   Colon cancer screening 07/03/2017   Elevated PSA 06/07/2017   Erectile dysfunction 02/27/2016   Class 2 severe obesity with serious comorbidity and body mass index (BMI) of 36.0 to 36.9 in adult 02/24/2015   Allergic rhinitis 01/12/2008   Hyperlipidemia 10/08/2007   1. OSA (obstructive sleep apnea) (Primary) The patient does tolerate PAP and reports  benefit from PAP use. The patient was reminded how to clean equipment and advised to replace supplies routinely. The patient was also counselled on weight loss.  The compliance is excellent. The AHI is 0.4.   OSA on cpap- controlled.  Continue with excellent compliance with pap. CPAP continues to be medically necessary to treat this patient's OSA. F/u one year.    2. CPAP use counseling CPAP Counseling: had a lengthy discussion with the patient regarding the importance of PAP therapy in management of the sleep apnea. Patient appears to understand the risk factor reduction and also understands the risks associated with untreated sleep apnea. Patient will try to make a good faith effort to remain compliant with therapy. Also instructed the patient on proper cleaning of the device including the water  must be changed daily if possible and use of distilled water  is preferred. Patient understands that the machine should be regularly cleaned with appropriate recommended cleaning solutions that do not damage the PAP machine for example given white vinegar and water  rinses. Other methods such as ozone treatment may not be as good as these simple methods to achieve cleaning.   3. Hypertension, unspecified type Controlled with amlodipine  and telmisartan -hydrochlorothiazide . Continue.      General Counseling: I have discussed the findings of the evaluation and examination with Warren [name].  I have also discussed any further diagnostic evaluation thatmay be needed or ordered today. Warren [name] verbalizes understanding of the findings of todays visit. We also reviewed his medications today and discussed drug interactions and side effects including but not limited excessive drowsiness and altered mental states. We also discussed that there is always a risk not just to him but also people around him. he has been encouraged to call the office with any questions or concerns that should arise related to todays visit.  No orders of the defined types were placed in this encounter.       I have personally obtained a history, examined the patient, evaluated laboratory and imaging results, formulated the assessment and plan and placed orders. This patient was seen  today by Lauraine Lay, PA-C in collaboration with Dr. Elfreda Bathe.   Elfreda DELENA Bathe, MD [facility] Diplomate ABMS Pulmonary Critical Care Medicine and Sleep Medicine

## 2024-08-24 ENCOUNTER — Ambulatory Visit: Admitting: Internal Medicine

## 2024-08-24 VITALS — BP 166/93 | HR 89 | Resp 16 | Ht 74.0 in | Wt 286.0 lb

## 2024-08-24 DIAGNOSIS — G4733 Obstructive sleep apnea (adult) (pediatric): Secondary | ICD-10-CM | POA: Diagnosis not present

## 2024-08-24 DIAGNOSIS — Z7189 Other specified counseling: Secondary | ICD-10-CM | POA: Diagnosis not present

## 2024-08-24 DIAGNOSIS — I10 Essential (primary) hypertension: Secondary | ICD-10-CM | POA: Diagnosis not present

## 2024-09-26 ENCOUNTER — Encounter: Payer: Self-pay | Admitting: Nurse Practitioner

## 2024-10-19 ENCOUNTER — Institutional Professional Consult (permissible substitution): Admitting: Plastic Surgery

## 2024-12-03 ENCOUNTER — Ambulatory Visit

## 2025-01-06 ENCOUNTER — Ambulatory Visit: Admitting: Nurse Practitioner

## 2025-02-03 ENCOUNTER — Ambulatory Visit: Admitting: Urology
